# Patient Record
Sex: Male | Born: 1945 | Race: White | Hispanic: No | Smoking: Current every day smoker
Health system: Southern US, Community
[De-identification: ages and names within clinical notes are randomized; demographics above are authoritative.]

## PROBLEM LIST (undated history)

## (undated) DIAGNOSIS — I219 Acute myocardial infarction, unspecified: Secondary | ICD-10-CM

## (undated) DIAGNOSIS — M2420 Disorder of ligament, unspecified site: Secondary | ICD-10-CM

## (undated) DIAGNOSIS — F32A Depression, unspecified: Secondary | ICD-10-CM

## (undated) DIAGNOSIS — J449 Chronic obstructive pulmonary disease, unspecified: Secondary | ICD-10-CM

## (undated) DIAGNOSIS — G5600 Carpal tunnel syndrome, unspecified upper limb: Secondary | ICD-10-CM

## (undated) DIAGNOSIS — I10 Essential (primary) hypertension: Secondary | ICD-10-CM

## (undated) DIAGNOSIS — I259 Chronic ischemic heart disease, unspecified: Secondary | ICD-10-CM

## (undated) DIAGNOSIS — E785 Hyperlipidemia, unspecified: Secondary | ICD-10-CM

## (undated) DIAGNOSIS — L570 Actinic keratosis: Secondary | ICD-10-CM

## (undated) DIAGNOSIS — R0602 Shortness of breath: Secondary | ICD-10-CM

## (undated) DIAGNOSIS — M629 Disorder of muscle, unspecified: Secondary | ICD-10-CM

## (undated) DIAGNOSIS — F329 Major depressive disorder, single episode, unspecified: Secondary | ICD-10-CM

## (undated) DIAGNOSIS — I739 Peripheral vascular disease, unspecified: Secondary | ICD-10-CM

## (undated) DIAGNOSIS — I2510 Atherosclerotic heart disease of native coronary artery without angina pectoris: Secondary | ICD-10-CM

## (undated) DIAGNOSIS — M19019 Primary osteoarthritis, unspecified shoulder: Secondary | ICD-10-CM

## (undated) DIAGNOSIS — I4891 Unspecified atrial fibrillation: Secondary | ICD-10-CM

## (undated) DIAGNOSIS — M5030 Other cervical disc degeneration, unspecified cervical region: Secondary | ICD-10-CM

## (undated) HISTORY — DX: Disorder of muscle, unspecified: M62.9

## (undated) HISTORY — DX: Atherosclerotic heart disease of native coronary artery without angina pectoris: I25.10

## (undated) HISTORY — PX: CORONARY ARTERY BYPASS GRAFT: SHX141

## (undated) HISTORY — DX: Essential (primary) hypertension: I10

## (undated) HISTORY — PX: CORONARY ANGIOPLASTY: SHX604

## (undated) HISTORY — PX: FINGER AMPUTATION: SHX636

## (undated) HISTORY — DX: Actinic keratosis: L57.0

## (undated) HISTORY — DX: Hyperlipidemia, unspecified: E78.5

## (undated) HISTORY — DX: Peripheral vascular disease, unspecified: I73.9

## (undated) HISTORY — DX: Disorder of ligament, unspecified site: M24.20

## (undated) HISTORY — PX: CARPAL TUNNEL RELEASE: SHX101

## (undated) HISTORY — DX: Chronic ischemic heart disease, unspecified: I25.9

## (undated) HISTORY — DX: Other cervical disc degeneration, unspecified cervical region: M50.30

## (undated) HISTORY — DX: Chronic obstructive pulmonary disease, unspecified: J44.9

## (undated) HISTORY — DX: Unspecified atrial fibrillation: I48.91

## (undated) HISTORY — DX: Primary osteoarthritis, unspecified shoulder: M19.019

## (undated) HISTORY — DX: Carpal tunnel syndrome, unspecified upper limb: G56.00

## (undated) HISTORY — DX: Depression, unspecified: F32.A

## (undated) HISTORY — DX: Major depressive disorder, single episode, unspecified: F32.9

---

## 2002-10-10 ENCOUNTER — Encounter: Payer: Self-pay | Admitting: Emergency Medicine

## 2002-10-10 ENCOUNTER — Inpatient Hospital Stay (HOSPITAL_COMMUNITY): Admission: EM | Admit: 2002-10-10 | Discharge: 2002-10-21 | Payer: Self-pay | Admitting: Emergency Medicine

## 2002-10-11 ENCOUNTER — Encounter: Payer: Self-pay | Admitting: Internal Medicine

## 2002-10-12 ENCOUNTER — Encounter (INDEPENDENT_AMBULATORY_CARE_PROVIDER_SITE_OTHER): Payer: Self-pay | Admitting: Cardiology

## 2002-10-13 ENCOUNTER — Encounter: Payer: Self-pay | Admitting: Internal Medicine

## 2002-10-15 ENCOUNTER — Encounter: Payer: Self-pay | Admitting: Cardiothoracic Surgery

## 2002-10-16 ENCOUNTER — Encounter: Payer: Self-pay | Admitting: Cardiothoracic Surgery

## 2002-10-17 ENCOUNTER — Encounter: Payer: Self-pay | Admitting: Cardiothoracic Surgery

## 2002-10-18 ENCOUNTER — Encounter: Payer: Self-pay | Admitting: Surgery

## 2002-10-19 ENCOUNTER — Encounter: Payer: Self-pay | Admitting: Surgery

## 2002-10-20 ENCOUNTER — Encounter: Payer: Self-pay | Admitting: Cardiothoracic Surgery

## 2002-11-13 ENCOUNTER — Encounter: Admission: RE | Admit: 2002-11-13 | Discharge: 2002-11-13 | Payer: Self-pay | Admitting: Cardiothoracic Surgery

## 2002-11-13 ENCOUNTER — Encounter: Payer: Self-pay | Admitting: Cardiothoracic Surgery

## 2002-12-03 ENCOUNTER — Encounter: Payer: Self-pay | Admitting: Cardiology

## 2002-12-03 ENCOUNTER — Inpatient Hospital Stay (HOSPITAL_COMMUNITY): Admission: AD | Admit: 2002-12-03 | Discharge: 2002-12-07 | Payer: Self-pay | Admitting: Cardiology

## 2003-06-06 ENCOUNTER — Emergency Department (HOSPITAL_COMMUNITY): Admission: AD | Admit: 2003-06-06 | Discharge: 2003-06-06 | Payer: Self-pay | Admitting: Family Medicine

## 2003-09-29 ENCOUNTER — Inpatient Hospital Stay (HOSPITAL_COMMUNITY): Admission: EM | Admit: 2003-09-29 | Discharge: 2003-10-06 | Payer: Self-pay | Admitting: Emergency Medicine

## 2003-09-29 ENCOUNTER — Encounter (INDEPENDENT_AMBULATORY_CARE_PROVIDER_SITE_OTHER): Payer: Self-pay | Admitting: Cardiology

## 2003-10-04 DIAGNOSIS — Z9861 Coronary angioplasty status: Secondary | ICD-10-CM

## 2003-11-12 ENCOUNTER — Inpatient Hospital Stay (HOSPITAL_COMMUNITY): Admission: EM | Admit: 2003-11-12 | Discharge: 2003-11-13 | Payer: Self-pay | Admitting: Emergency Medicine

## 2004-07-09 ENCOUNTER — Encounter (INDEPENDENT_AMBULATORY_CARE_PROVIDER_SITE_OTHER): Payer: Self-pay | Admitting: Nurse Practitioner

## 2004-07-09 LAB — CONVERTED CEMR LAB: PSA: 0.32 ng/mL

## 2005-04-25 ENCOUNTER — Ambulatory Visit: Payer: Self-pay | Admitting: Family Medicine

## 2005-05-09 ENCOUNTER — Ambulatory Visit: Payer: Self-pay | Admitting: Family Medicine

## 2005-05-10 ENCOUNTER — Encounter (INDEPENDENT_AMBULATORY_CARE_PROVIDER_SITE_OTHER): Payer: Self-pay | Admitting: Nurse Practitioner

## 2005-06-26 ENCOUNTER — Ambulatory Visit: Payer: Self-pay | Admitting: Family Medicine

## 2005-08-08 ENCOUNTER — Ambulatory Visit: Payer: Self-pay | Admitting: Family Medicine

## 2005-11-09 ENCOUNTER — Emergency Department (HOSPITAL_COMMUNITY): Admission: EM | Admit: 2005-11-09 | Discharge: 2005-11-09 | Payer: Self-pay | Admitting: Family Medicine

## 2005-11-27 ENCOUNTER — Emergency Department (HOSPITAL_COMMUNITY): Admission: EM | Admit: 2005-11-27 | Discharge: 2005-11-27 | Payer: Self-pay | Admitting: Family Medicine

## 2005-11-29 ENCOUNTER — Ambulatory Visit: Payer: Self-pay | Admitting: Internal Medicine

## 2005-12-12 ENCOUNTER — Ambulatory Visit (HOSPITAL_COMMUNITY): Admission: RE | Admit: 2005-12-12 | Discharge: 2005-12-12 | Payer: Self-pay | Admitting: Orthopedic Surgery

## 2005-12-26 ENCOUNTER — Encounter: Admission: RE | Admit: 2005-12-26 | Discharge: 2005-12-26 | Payer: Self-pay | Admitting: Cardiology

## 2006-01-16 ENCOUNTER — Ambulatory Visit (HOSPITAL_BASED_OUTPATIENT_CLINIC_OR_DEPARTMENT_OTHER): Admission: RE | Admit: 2006-01-16 | Discharge: 2006-01-16 | Payer: Self-pay | Admitting: Orthopedic Surgery

## 2006-07-23 ENCOUNTER — Ambulatory Visit: Payer: Self-pay | Admitting: Family Medicine

## 2006-09-16 ENCOUNTER — Ambulatory Visit: Payer: Self-pay | Admitting: Family Medicine

## 2006-09-17 ENCOUNTER — Ambulatory Visit (HOSPITAL_COMMUNITY): Admission: RE | Admit: 2006-09-17 | Discharge: 2006-09-17 | Payer: Self-pay | Admitting: *Deleted

## 2006-10-23 ENCOUNTER — Inpatient Hospital Stay (HOSPITAL_COMMUNITY): Admission: EM | Admit: 2006-10-23 | Discharge: 2006-10-25 | Payer: Self-pay | Admitting: *Deleted

## 2006-11-21 ENCOUNTER — Encounter: Admission: RE | Admit: 2006-11-21 | Discharge: 2006-12-11 | Payer: Self-pay | Admitting: Orthopedic Surgery

## 2007-02-07 ENCOUNTER — Encounter (INDEPENDENT_AMBULATORY_CARE_PROVIDER_SITE_OTHER): Payer: Self-pay | Admitting: Nurse Practitioner

## 2007-02-17 ENCOUNTER — Ambulatory Visit: Payer: Self-pay | Admitting: Internal Medicine

## 2007-03-20 ENCOUNTER — Encounter (INDEPENDENT_AMBULATORY_CARE_PROVIDER_SITE_OTHER): Payer: Self-pay | Admitting: Nurse Practitioner

## 2007-03-24 ENCOUNTER — Encounter (INDEPENDENT_AMBULATORY_CARE_PROVIDER_SITE_OTHER): Payer: Self-pay | Admitting: Nurse Practitioner

## 2007-03-24 DIAGNOSIS — I739 Peripheral vascular disease, unspecified: Secondary | ICD-10-CM

## 2007-03-24 DIAGNOSIS — I2589 Other forms of chronic ischemic heart disease: Secondary | ICD-10-CM

## 2007-03-24 DIAGNOSIS — E785 Hyperlipidemia, unspecified: Secondary | ICD-10-CM

## 2007-03-24 DIAGNOSIS — F17200 Nicotine dependence, unspecified, uncomplicated: Secondary | ICD-10-CM

## 2007-03-24 DIAGNOSIS — I4891 Unspecified atrial fibrillation: Secondary | ICD-10-CM | POA: Insufficient documentation

## 2007-03-24 DIAGNOSIS — I2510 Atherosclerotic heart disease of native coronary artery without angina pectoris: Secondary | ICD-10-CM

## 2007-05-06 ENCOUNTER — Encounter (INDEPENDENT_AMBULATORY_CARE_PROVIDER_SITE_OTHER): Payer: Self-pay | Admitting: Nurse Practitioner

## 2007-06-03 ENCOUNTER — Encounter (INDEPENDENT_AMBULATORY_CARE_PROVIDER_SITE_OTHER): Payer: Self-pay | Admitting: Nurse Practitioner

## 2007-06-13 ENCOUNTER — Ambulatory Visit: Payer: Self-pay | Admitting: Nurse Practitioner

## 2007-06-13 DIAGNOSIS — J40 Bronchitis, not specified as acute or chronic: Secondary | ICD-10-CM | POA: Insufficient documentation

## 2007-06-13 LAB — CONVERTED CEMR LAB
ALT: 15 units/L (ref 0–53)
Basophils Absolute: 0 10*3/uL (ref 0.0–0.1)
Calcium: 9.8 mg/dL (ref 8.4–10.5)
Cholesterol: 166 mg/dL (ref 0–200)
Eosinophils Absolute: 0.2 10*3/uL (ref 0.2–0.7)
Eosinophils Relative: 3 % (ref 0–5)
HCT: 48.6 % (ref 39.0–52.0)
Hemoglobin: 17 g/dL (ref 13.0–17.0)
MCV: 91.9 fL (ref 78.0–100.0)
Monocytes Absolute: 0.6 10*3/uL (ref 0.1–1.0)
Monocytes Relative: 9 % (ref 3–12)
Neutrophils Relative %: 50 % (ref 43–77)
Potassium: 4.9 meq/L (ref 3.5–5.3)
RBC: 5.29 M/uL (ref 4.22–5.81)
RDW: 12.4 % (ref 11.5–15.5)
Sodium: 137 meq/L (ref 135–145)
TSH: 1.064 microintl units/mL (ref 0.350–5.50)
Total CHOL/HDL Ratio: 4.3
Total Protein: 7.7 g/dL (ref 6.0–8.3)
Triglycerides: 87 mg/dL (ref <150)
VLDL: 17 mg/dL (ref 0–40)

## 2007-06-18 ENCOUNTER — Encounter (INDEPENDENT_AMBULATORY_CARE_PROVIDER_SITE_OTHER): Payer: Self-pay | Admitting: Nurse Practitioner

## 2007-07-09 ENCOUNTER — Encounter (INDEPENDENT_AMBULATORY_CARE_PROVIDER_SITE_OTHER): Payer: Self-pay | Admitting: Nurse Practitioner

## 2007-08-01 ENCOUNTER — Encounter (INDEPENDENT_AMBULATORY_CARE_PROVIDER_SITE_OTHER): Payer: Self-pay | Admitting: Internal Medicine

## 2007-08-07 ENCOUNTER — Ambulatory Visit: Payer: Self-pay | Admitting: Internal Medicine

## 2007-08-07 DIAGNOSIS — J4489 Other specified chronic obstructive pulmonary disease: Secondary | ICD-10-CM | POA: Insufficient documentation

## 2007-08-07 DIAGNOSIS — G5600 Carpal tunnel syndrome, unspecified upper limb: Secondary | ICD-10-CM

## 2007-08-07 DIAGNOSIS — Z9889 Other specified postprocedural states: Secondary | ICD-10-CM

## 2007-08-07 DIAGNOSIS — J449 Chronic obstructive pulmonary disease, unspecified: Secondary | ICD-10-CM

## 2007-08-07 LAB — CONVERTED CEMR LAB: Blood Glucose, Fingerstick: 107

## 2007-08-08 ENCOUNTER — Ambulatory Visit: Payer: Self-pay | Admitting: Internal Medicine

## 2007-08-09 ENCOUNTER — Encounter (INDEPENDENT_AMBULATORY_CARE_PROVIDER_SITE_OTHER): Payer: Self-pay | Admitting: Internal Medicine

## 2007-08-09 LAB — CONVERTED CEMR LAB
Albumin: 4.1 g/dL (ref 3.5–5.2)
Alkaline Phosphatase: 54 units/L (ref 39–117)
BUN: 14 mg/dL (ref 6–23)
Basophils Relative: 1 % (ref 0–1)
CO2: 27 meq/L (ref 19–32)
Calcium: 9.5 mg/dL (ref 8.4–10.5)
Cholesterol: 127 mg/dL (ref 0–200)
Creatinine, Ser: 1.06 mg/dL (ref 0.40–1.50)
Eosinophils Absolute: 0.3 10*3/uL (ref 0.0–0.7)
Hemoglobin: 16.5 g/dL (ref 13.0–17.0)
LDL Cholesterol: 80 mg/dL (ref 0–99)
Lymphocytes Relative: 42 % (ref 12–46)
Lymphs Abs: 2.6 10*3/uL (ref 0.7–4.0)
MCV: 95.6 fL (ref 78.0–100.0)
Monocytes Relative: 9 % (ref 3–12)
Neutrophils Relative %: 43 % (ref 43–77)
Platelets: 151 10*3/uL (ref 150–400)
Sodium: 142 meq/L (ref 135–145)
Total CHOL/HDL Ratio: 3.5
Total Protein: 6.7 g/dL (ref 6.0–8.3)
WBC: 6.2 10*3/uL (ref 4.0–10.5)

## 2007-08-19 ENCOUNTER — Ambulatory Visit: Payer: Self-pay | Admitting: Surgery

## 2007-08-19 ENCOUNTER — Encounter (INDEPENDENT_AMBULATORY_CARE_PROVIDER_SITE_OTHER): Payer: Self-pay | Admitting: Internal Medicine

## 2007-08-19 ENCOUNTER — Ambulatory Visit (HOSPITAL_COMMUNITY): Admission: RE | Admit: 2007-08-19 | Discharge: 2007-08-19 | Payer: Self-pay | Admitting: Internal Medicine

## 2007-08-26 ENCOUNTER — Encounter (INDEPENDENT_AMBULATORY_CARE_PROVIDER_SITE_OTHER): Payer: Self-pay | Admitting: Internal Medicine

## 2007-09-12 ENCOUNTER — Encounter (INDEPENDENT_AMBULATORY_CARE_PROVIDER_SITE_OTHER): Payer: Self-pay | Admitting: Internal Medicine

## 2007-09-27 ENCOUNTER — Encounter (INDEPENDENT_AMBULATORY_CARE_PROVIDER_SITE_OTHER): Payer: Self-pay | Admitting: Internal Medicine

## 2007-10-03 ENCOUNTER — Ambulatory Visit: Payer: Self-pay | Admitting: Internal Medicine

## 2007-10-10 ENCOUNTER — Encounter (INDEPENDENT_AMBULATORY_CARE_PROVIDER_SITE_OTHER): Payer: Self-pay | Admitting: Internal Medicine

## 2007-10-24 ENCOUNTER — Encounter (INDEPENDENT_AMBULATORY_CARE_PROVIDER_SITE_OTHER): Payer: Self-pay | Admitting: Internal Medicine

## 2007-11-14 ENCOUNTER — Ambulatory Visit: Payer: Self-pay | Admitting: Internal Medicine

## 2007-11-14 DIAGNOSIS — E1165 Type 2 diabetes mellitus with hyperglycemia: Secondary | ICD-10-CM

## 2007-11-14 DIAGNOSIS — I10 Essential (primary) hypertension: Secondary | ICD-10-CM

## 2007-11-14 LAB — CONVERTED CEMR LAB
Blood Glucose, Fingerstick: 126
HDL: 39 mg/dL — ABNORMAL LOW (ref >39)
Hgb A1c MFr Bld: 7.5 %
LDL Cholesterol: 90 mg/dL (ref 0–99)
VLDL: 16 mg/dL (ref 0–40)

## 2007-11-21 ENCOUNTER — Encounter (INDEPENDENT_AMBULATORY_CARE_PROVIDER_SITE_OTHER): Payer: Self-pay | Admitting: Internal Medicine

## 2007-11-23 ENCOUNTER — Encounter (INDEPENDENT_AMBULATORY_CARE_PROVIDER_SITE_OTHER): Payer: Self-pay | Admitting: Internal Medicine

## 2008-02-24 ENCOUNTER — Emergency Department (HOSPITAL_COMMUNITY): Admission: EM | Admit: 2008-02-24 | Discharge: 2008-02-24 | Payer: Self-pay | Admitting: Emergency Medicine

## 2008-03-24 ENCOUNTER — Encounter (INDEPENDENT_AMBULATORY_CARE_PROVIDER_SITE_OTHER): Payer: Self-pay | Admitting: *Deleted

## 2008-05-07 ENCOUNTER — Encounter (INDEPENDENT_AMBULATORY_CARE_PROVIDER_SITE_OTHER): Payer: Self-pay | Admitting: *Deleted

## 2008-05-14 ENCOUNTER — Ambulatory Visit: Payer: Self-pay | Admitting: Internal Medicine

## 2008-05-22 ENCOUNTER — Encounter (INDEPENDENT_AMBULATORY_CARE_PROVIDER_SITE_OTHER): Payer: Self-pay | Admitting: Internal Medicine

## 2008-05-22 LAB — CONVERTED CEMR LAB
Alkaline Phosphatase: 45 units/L (ref 39–117)
BUN: 13 mg/dL (ref 6–23)
CO2: 25 meq/L (ref 19–32)
Cholesterol: 136 mg/dL (ref 0–200)
Creatinine, Ser: 1.1 mg/dL (ref 0.40–1.50)
Eosinophils Absolute: 0.2 10*3/uL (ref 0.0–0.7)
Eosinophils Relative: 3 % (ref 0–5)
Glucose, Bld: 59 mg/dL — ABNORMAL LOW (ref 70–99)
HCT: 49 % (ref 39.0–52.0)
HDL: 46 mg/dL (ref >39)
Hemoglobin: 16.3 g/dL (ref 13.0–17.0)
LDL Cholesterol: 76 mg/dL (ref 0–99)
Lymphocytes Relative: 43 % (ref 12–46)
Lymphs Abs: 3.4 10*3/uL (ref 0.7–4.0)
MCV: 95.9 fL (ref 78.0–100.0)
Monocytes Absolute: 0.5 10*3/uL (ref 0.1–1.0)
Monocytes Relative: 6 % (ref 3–12)
Platelets: 170 10*3/uL (ref 150–400)
Sodium: 142 meq/L (ref 135–145)
Total Bilirubin: 0.7 mg/dL (ref 0.3–1.2)
Total CHOL/HDL Ratio: 3
Total Protein: 7.6 g/dL (ref 6.0–8.3)
Triglycerides: 68 mg/dL (ref <150)
VLDL: 14 mg/dL (ref 0–40)
WBC: 7.9 10*3/uL (ref 4.0–10.5)

## 2008-05-25 ENCOUNTER — Encounter (INDEPENDENT_AMBULATORY_CARE_PROVIDER_SITE_OTHER): Payer: Self-pay | Admitting: *Deleted

## 2008-06-21 ENCOUNTER — Telehealth (INDEPENDENT_AMBULATORY_CARE_PROVIDER_SITE_OTHER): Payer: Self-pay | Admitting: Internal Medicine

## 2008-06-25 ENCOUNTER — Telehealth (INDEPENDENT_AMBULATORY_CARE_PROVIDER_SITE_OTHER): Payer: Self-pay | Admitting: Internal Medicine

## 2008-07-26 ENCOUNTER — Encounter (INDEPENDENT_AMBULATORY_CARE_PROVIDER_SITE_OTHER): Payer: Self-pay | Admitting: Internal Medicine

## 2008-08-12 ENCOUNTER — Encounter (INDEPENDENT_AMBULATORY_CARE_PROVIDER_SITE_OTHER): Payer: Self-pay | Admitting: Internal Medicine

## 2008-08-31 ENCOUNTER — Emergency Department (HOSPITAL_COMMUNITY): Admission: EM | Admit: 2008-08-31 | Discharge: 2008-08-31 | Payer: Self-pay | Admitting: Emergency Medicine

## 2008-09-10 ENCOUNTER — Telehealth (INDEPENDENT_AMBULATORY_CARE_PROVIDER_SITE_OTHER): Payer: Self-pay | Admitting: Internal Medicine

## 2008-09-15 ENCOUNTER — Ambulatory Visit: Payer: Self-pay | Admitting: Internal Medicine

## 2008-09-15 LAB — CONVERTED CEMR LAB
LDL Cholesterol: 85 mg/dL (ref 0–99)
Triglycerides: 47 mg/dL (ref <150)
VLDL: 9 mg/dL (ref 0–40)

## 2008-09-17 ENCOUNTER — Ambulatory Visit: Payer: Self-pay | Admitting: Internal Medicine

## 2008-09-17 DIAGNOSIS — M19019 Primary osteoarthritis, unspecified shoulder: Secondary | ICD-10-CM | POA: Insufficient documentation

## 2008-09-17 DIAGNOSIS — M5030 Other cervical disc degeneration, unspecified cervical region: Secondary | ICD-10-CM

## 2008-09-17 LAB — CONVERTED CEMR LAB: Blood Glucose, Fingerstick: 78

## 2008-10-15 ENCOUNTER — Emergency Department (HOSPITAL_COMMUNITY): Admission: EM | Admit: 2008-10-15 | Discharge: 2008-10-15 | Payer: Self-pay | Admitting: Emergency Medicine

## 2008-10-18 ENCOUNTER — Encounter (INDEPENDENT_AMBULATORY_CARE_PROVIDER_SITE_OTHER): Payer: Self-pay | Admitting: Internal Medicine

## 2008-11-02 ENCOUNTER — Encounter (INDEPENDENT_AMBULATORY_CARE_PROVIDER_SITE_OTHER): Payer: Self-pay | Admitting: Internal Medicine

## 2008-11-03 ENCOUNTER — Telehealth (INDEPENDENT_AMBULATORY_CARE_PROVIDER_SITE_OTHER): Payer: Self-pay | Admitting: Internal Medicine

## 2008-12-22 ENCOUNTER — Ambulatory Visit: Payer: Self-pay | Admitting: Internal Medicine

## 2008-12-22 LAB — CONVERTED CEMR LAB
AST: 17 units/L (ref 0–37)
Alkaline Phosphatase: 64 units/L (ref 39–117)
HDL: 38 mg/dL — ABNORMAL LOW (ref >39)
Indirect Bilirubin: 0.5 mg/dL (ref 0.0–0.9)
LDL Cholesterol: 114 mg/dL — ABNORMAL HIGH (ref 0–99)
Total Protein: 7 g/dL (ref 6.0–8.3)
Triglycerides: 90 mg/dL (ref <150)

## 2008-12-24 ENCOUNTER — Ambulatory Visit: Payer: Self-pay | Admitting: Internal Medicine

## 2009-02-04 ENCOUNTER — Ambulatory Visit: Payer: Self-pay | Admitting: Internal Medicine

## 2009-02-04 DIAGNOSIS — D485 Neoplasm of uncertain behavior of skin: Secondary | ICD-10-CM

## 2009-02-04 DIAGNOSIS — F329 Major depressive disorder, single episode, unspecified: Secondary | ICD-10-CM

## 2009-02-04 LAB — CONVERTED CEMR LAB: Hgb A1c MFr Bld: 6.5 %

## 2009-03-18 ENCOUNTER — Ambulatory Visit: Payer: Self-pay | Admitting: Internal Medicine

## 2009-03-18 LAB — CONVERTED CEMR LAB: Blood Glucose, Fingerstick: 139

## 2009-04-01 ENCOUNTER — Ambulatory Visit: Payer: Self-pay | Admitting: Internal Medicine

## 2009-04-05 ENCOUNTER — Encounter (INDEPENDENT_AMBULATORY_CARE_PROVIDER_SITE_OTHER): Payer: Self-pay | Admitting: Internal Medicine

## 2009-04-13 ENCOUNTER — Emergency Department (HOSPITAL_COMMUNITY): Admission: EM | Admit: 2009-04-13 | Discharge: 2009-04-13 | Payer: Self-pay | Admitting: Emergency Medicine

## 2009-04-14 ENCOUNTER — Encounter (INDEPENDENT_AMBULATORY_CARE_PROVIDER_SITE_OTHER): Payer: Self-pay | Admitting: *Deleted

## 2009-04-14 LAB — CONVERTED CEMR LAB
ALT: 15 units/L (ref 0–53)
Albumin: 4.2 g/dL (ref 3.5–5.2)
Alkaline Phosphatase: 79 units/L (ref 39–117)
Cholesterol: 173 mg/dL (ref 0–200)
HDL: 34 mg/dL — ABNORMAL LOW (ref >39)
Indirect Bilirubin: 0.6 mg/dL (ref 0.0–0.9)
Total Protein: 6.4 g/dL (ref 6.0–8.3)
Triglycerides: 128 mg/dL (ref <150)
VLDL: 26 mg/dL (ref 0–40)

## 2009-05-12 ENCOUNTER — Encounter (INDEPENDENT_AMBULATORY_CARE_PROVIDER_SITE_OTHER): Payer: Self-pay | Admitting: Internal Medicine

## 2009-05-12 DIAGNOSIS — L570 Actinic keratosis: Secondary | ICD-10-CM

## 2009-05-19 ENCOUNTER — Telehealth (INDEPENDENT_AMBULATORY_CARE_PROVIDER_SITE_OTHER): Payer: Self-pay | Admitting: Internal Medicine

## 2009-05-31 ENCOUNTER — Ambulatory Visit: Payer: Self-pay | Admitting: Internal Medicine

## 2009-06-17 ENCOUNTER — Emergency Department (HOSPITAL_COMMUNITY): Admission: EM | Admit: 2009-06-17 | Discharge: 2009-06-17 | Payer: Self-pay | Admitting: Family Medicine

## 2009-06-27 LAB — CONVERTED CEMR LAB
Alkaline Phosphatase: 91 units/L (ref 39–117)
BUN: 15 mg/dL (ref 6–23)
Glucose, Bld: 117 mg/dL — ABNORMAL HIGH (ref 70–99)
HDL: 36 mg/dL — ABNORMAL LOW (ref >39)
LDL Cholesterol: 122 mg/dL — ABNORMAL HIGH (ref 0–99)
Total Bilirubin: 0.5 mg/dL (ref 0.3–1.2)
Total CHOL/HDL Ratio: 5.9
Triglycerides: 282 mg/dL — ABNORMAL HIGH (ref <150)
VLDL: 56 mg/dL — ABNORMAL HIGH (ref 0–40)

## 2009-08-01 ENCOUNTER — Encounter (INDEPENDENT_AMBULATORY_CARE_PROVIDER_SITE_OTHER): Payer: Self-pay | Admitting: Internal Medicine

## 2009-09-29 ENCOUNTER — Ambulatory Visit: Payer: Self-pay | Admitting: Internal Medicine

## 2009-10-15 ENCOUNTER — Observation Stay (HOSPITAL_COMMUNITY): Admission: EM | Admit: 2009-10-15 | Discharge: 2009-10-15 | Payer: Self-pay | Admitting: Emergency Medicine

## 2009-11-01 ENCOUNTER — Encounter: Admission: RE | Admit: 2009-11-01 | Discharge: 2010-01-30 | Payer: Self-pay | Admitting: Plastic Surgery

## 2009-11-04 ENCOUNTER — Emergency Department (HOSPITAL_COMMUNITY): Admission: EM | Admit: 2009-11-04 | Discharge: 2009-11-04 | Payer: Self-pay | Admitting: Family Medicine

## 2009-11-18 ENCOUNTER — Telehealth (INDEPENDENT_AMBULATORY_CARE_PROVIDER_SITE_OTHER): Payer: Self-pay | Admitting: Internal Medicine

## 2009-11-23 ENCOUNTER — Telehealth (INDEPENDENT_AMBULATORY_CARE_PROVIDER_SITE_OTHER): Payer: Self-pay | Admitting: Internal Medicine

## 2009-11-30 ENCOUNTER — Ambulatory Visit: Payer: Self-pay | Admitting: Internal Medicine

## 2009-12-06 ENCOUNTER — Encounter (INDEPENDENT_AMBULATORY_CARE_PROVIDER_SITE_OTHER): Payer: Self-pay | Admitting: Internal Medicine

## 2010-01-04 LAB — CONVERTED CEMR LAB
ALT: 12 units/L (ref 0–53)
AST: 17 units/L (ref 0–37)
Albumin: 4.1 g/dL (ref 3.5–5.2)
Alkaline Phosphatase: 94 units/L (ref 39–117)
LDL Cholesterol: 171 mg/dL — ABNORMAL HIGH (ref 0–99)
Potassium: 4.4 meq/L (ref 3.5–5.3)
Sodium: 141 meq/L (ref 135–145)
Total Protein: 6.9 g/dL (ref 6.0–8.3)

## 2010-02-08 ENCOUNTER — Encounter (INDEPENDENT_AMBULATORY_CARE_PROVIDER_SITE_OTHER): Payer: Self-pay | Admitting: Internal Medicine

## 2010-02-19 ENCOUNTER — Emergency Department (HOSPITAL_COMMUNITY): Admission: EM | Admit: 2010-02-19 | Discharge: 2010-02-19 | Payer: Self-pay | Admitting: Emergency Medicine

## 2010-02-24 ENCOUNTER — Encounter (INDEPENDENT_AMBULATORY_CARE_PROVIDER_SITE_OTHER): Payer: Self-pay | Admitting: Internal Medicine

## 2010-02-27 ENCOUNTER — Ambulatory Visit: Payer: Self-pay | Admitting: Internal Medicine

## 2010-02-27 ENCOUNTER — Telehealth (INDEPENDENT_AMBULATORY_CARE_PROVIDER_SITE_OTHER): Payer: Self-pay | Admitting: Internal Medicine

## 2010-02-27 LAB — CONVERTED CEMR LAB
ALT: 27 units/L (ref 0–53)
AST: 32 units/L (ref 0–37)
Bilirubin, Direct: 0.3 mg/dL (ref 0.0–0.3)
Cholesterol: 170 mg/dL (ref 0–200)
HDL: 33 mg/dL — ABNORMAL LOW (ref >39)
Total CHOL/HDL Ratio: 5.2

## 2010-03-01 ENCOUNTER — Encounter (INDEPENDENT_AMBULATORY_CARE_PROVIDER_SITE_OTHER): Payer: Self-pay | Admitting: Internal Medicine

## 2010-03-17 ENCOUNTER — Ambulatory Visit: Payer: Self-pay | Admitting: Internal Medicine

## 2010-03-17 LAB — CONVERTED CEMR LAB: Blood Glucose, Fingerstick: 107

## 2010-03-27 ENCOUNTER — Encounter (INDEPENDENT_AMBULATORY_CARE_PROVIDER_SITE_OTHER): Payer: Self-pay | Admitting: *Deleted

## 2010-04-06 ENCOUNTER — Ambulatory Visit: Payer: Self-pay | Admitting: Physician Assistant

## 2010-04-06 DIAGNOSIS — R51 Headache: Secondary | ICD-10-CM

## 2010-04-06 DIAGNOSIS — R519 Headache, unspecified: Secondary | ICD-10-CM | POA: Insufficient documentation

## 2010-04-07 ENCOUNTER — Ambulatory Visit (HOSPITAL_COMMUNITY)
Admission: RE | Admit: 2010-04-07 | Discharge: 2010-04-07 | Payer: Self-pay | Source: Home / Self Care | Admitting: Internal Medicine

## 2010-04-07 ENCOUNTER — Encounter (INDEPENDENT_AMBULATORY_CARE_PROVIDER_SITE_OTHER): Payer: Self-pay | Admitting: Internal Medicine

## 2010-04-12 ENCOUNTER — Encounter (INDEPENDENT_AMBULATORY_CARE_PROVIDER_SITE_OTHER): Payer: Self-pay | Admitting: *Deleted

## 2010-04-17 ENCOUNTER — Ambulatory Visit: Payer: Self-pay | Admitting: Internal Medicine

## 2010-04-26 LAB — CONVERTED CEMR LAB: INR: 1.27 (ref <1.50)

## 2010-05-03 ENCOUNTER — Encounter (INDEPENDENT_AMBULATORY_CARE_PROVIDER_SITE_OTHER): Payer: Self-pay | Admitting: Internal Medicine

## 2010-05-18 ENCOUNTER — Ambulatory Visit: Payer: Self-pay | Admitting: Internal Medicine

## 2010-06-05 ENCOUNTER — Telehealth (INDEPENDENT_AMBULATORY_CARE_PROVIDER_SITE_OTHER): Payer: Self-pay | Admitting: Internal Medicine

## 2010-06-16 ENCOUNTER — Encounter (INDEPENDENT_AMBULATORY_CARE_PROVIDER_SITE_OTHER): Payer: Self-pay | Admitting: Internal Medicine

## 2010-06-16 ENCOUNTER — Ambulatory Visit: Payer: Self-pay | Admitting: Internal Medicine

## 2010-06-23 LAB — CONVERTED CEMR LAB
INR: 1.32 (ref <1.50)
Prothrombin Time: 16.6 s — ABNORMAL HIGH (ref 11.6–15.2)

## 2010-07-11 ENCOUNTER — Ambulatory Visit
Admission: RE | Admit: 2010-07-11 | Discharge: 2010-07-11 | Payer: Self-pay | Source: Home / Self Care | Attending: Internal Medicine | Admitting: Internal Medicine

## 2010-07-11 DIAGNOSIS — L821 Other seborrheic keratosis: Secondary | ICD-10-CM | POA: Insufficient documentation

## 2010-07-25 ENCOUNTER — Ambulatory Visit
Admission: RE | Admit: 2010-07-25 | Discharge: 2010-07-25 | Payer: Self-pay | Source: Home / Self Care | Attending: Internal Medicine | Admitting: Internal Medicine

## 2010-07-25 ENCOUNTER — Encounter (INDEPENDENT_AMBULATORY_CARE_PROVIDER_SITE_OTHER): Payer: Self-pay | Admitting: Internal Medicine

## 2010-07-30 ENCOUNTER — Encounter: Payer: Self-pay | Admitting: Cardiology

## 2010-08-04 LAB — CONVERTED CEMR LAB: Prothrombin Time: 24.8 s — ABNORMAL HIGH (ref 11.6–15.2)

## 2010-08-08 NOTE — Progress Notes (Signed)
Summary: Query - refill gabapentin?  Phone Note Outgoing Call   Summary of Call: Do you want his gabapentin refilled?  Has not been in for office visit since March.  Came in for labs this AM. Initial call taken by: Dutch Quint RN,  February 27, 2010 2:30 PM  Follow-up for Phone Call        ok to refill  Follow-up by: Lehman Prom FNP,  February 28, 2010 8:09 AM  Additional Follow-up for Phone Call Additional follow up Details #1::        Noted.  Dutch Quint RN  February 28, 2010 9:38 AM

## 2010-08-08 NOTE — Letter (Signed)
Summary: MEDICAL EQUIPMENT.Marland KitchenDIABETIC SHOES  MEDICAL EQUIPMENT.Marland KitchenDIABETIC SHOES   Imported By: Arta Bruce 12/06/2009 11:23:59  _____________________________________________________________________  External Attachment:    Type:   Image     Comment:   External Document

## 2010-08-08 NOTE — Progress Notes (Signed)
Summary: Query Lovaza  Phone Note From Pharmacy   Caller: [name] [address] [phone]* Summary of Call: Refill request Lovaza; Last filled with no refills, Last seen    Prescriptions: LOVAZA 1 GM CAPS (OMEGA-3-ACID ETHYL ESTERS) 4 caps by mouth daily  #120 x 11   Entered and Authorized by:   Julieanne Manson MD   Signed by:   Julieanne Manson MD on 06/06/2010   Method used:   Electronically to        [name] [id number]* (retail)       [address] [place], [place]  [address]       Ph: [phone]       Fax: [phone]   RxID:   [id number]

## 2010-08-08 NOTE — Letter (Signed)
Summary: [facility]MAILED  [facility]//MAILED   Imported By: Arta Bruce 04/07/2010 11:20:25  _____________________________________________________________________  External Attachment:    Type:   Image     Comment:   External Document

## 2010-08-08 NOTE — Letter (Signed)
Summary: *HSN Results Follow up  [facility]  [address] [place], [place] [address]   Phone: [phone]  Fax: [phone]      03/27/2010   Wayne [name] [address] [place], [place]  [address]   Dear  Wayne [name],                            ____S.Drinkard,FNP   ____D. Gore,FNP       ____B. McPherson,MD   ____V. Rankins,MD    ____E. Mulberry,MD    ____N. Daphine Deutscher, FNP  ____D. Reche Dixon, MD    ____K. Philipp Deputy, MD    ____Other     This letter is to inform you that your recent test(s):  _______Pap Smear    ____X___Lab Test     _______X-ray    __X_____ is within acceptable limits  _______ requires a medication change  ___X____ requires a follow-up lab visit  _______ requires a follow-up visit with your provider   Comments: We have been trying to reach you to let you know to stay on the same dose of coumadin and  follow up as planned in one month.       _________________________________________________________ If you have any questions, please contact our office                     Sincerely,  Armenia Shannon [facility]

## 2010-08-08 NOTE — Progress Notes (Signed)
Summary: Req for a new prescription (Dm shoes)  Phone Note Call from Patient Call back at [employer] Phone [phone] Call back at [phone]   Summary of Call: The pt at this moment is at the lobby and he needs a new prescription for his dm shoes because he misplaced the other one.  Also, a month ago at the ER the provider at there cut his index finger from his left hand because he had dm illness.  [name] [address])  Delrae Alfred MD  Initial call taken by: Manon Hilding,  Nov 18, 2009 12:18 PM    Prescriptions: DIABETIC SHOES peripheral neuropathy  #1 pair x 0   Entered by:   Vesta Mixer CMA   Authorized by:   Julieanne Manson MD   Signed by:   Vesta Mixer CMA on 11/18/2009   Method used:   Print then Give to Patient   RxID:   [id number]

## 2010-08-08 NOTE — Letter (Signed)
Summary: MEDICAL [facility]  MEDICAL EQUIPMENT/DIABETIC SHOES   Imported By: Arta Bruce 12/06/2009 11:05:59  _____________________________________________________________________  External Attachment:    Type:   Image     Comment:   External Document

## 2010-08-08 NOTE — Letter (Signed)
Summary: *HSN Results Follow up  [facility]  [address] [place], [place] [address]   Phone: [phone]  Fax: [phone]      04/12/2010   Wayne [name] [address] [place], [place]  [address]   Dear  Mr. Wayne [name],                            ____S.Drinkard,FNP   ____D. Gore,FNP       ____B. McPherson,MD   ____V. Rankins,MD    ____E. Mulberry,MD    ____N. Daphine Deutscher, FNP  ____D. Reche Dixon, MD    ____K. Philipp Deputy, MD    ____Other     This letter is to inform you that your recent test(s):  _______Pap Smear    _______Lab Test     _______X-ray   ___X___CT-Scan    _______ is within acceptable limits  _______ requires a medication change  _______ requires a follow-up lab visit  ___X____ requires a follow-up visit with your provider   Comments:  We have been trying to reach you.  Please give [place] a call at your earliest convenience.       _________________________________________________________ If you have any questions, please contact our office                     Sincerely,  Armenia Shannon [facility]

## 2010-08-08 NOTE — Assessment & Plan Note (Signed)
Summary: 4 MONTH FU///KT   Vital Signs:  Patient profile:   65 year old male Weight:      198.31 pounds BMI:     31.17 Temp:     97.3 degrees F Pulse rate:   102 / minute Pulse rhythm:   regular Resp:     20 per minute BP sitting:   116 / 77  (right arm) Cuff size:   regular  Vitals Entered By: Chauncy Passy, SMA CC: Pt. is here for a 4 month f/u from 11/10. DM Is Patient Diabetic? Yes Did you bring your meter with you today? No Pain Assessment Patient in pain? no      CBG Result 90  Does patient need assistance? Functional Status Self care Ambulation Normal   CC:  Pt. is here for a 4 month f/u from 11/10. DM.  History of Present Illness: 1.  Hyperlipidemia:  Not taking Lovaza--still has not been filled.  Do not see that preauthorization papers were done.  Pt. continues to have no knowledge in medication--cannot get him to bring his med bottles in on a consistent basis.  Lipids were not at goal at last check.  Best control was with Caduet and Gemfibrozil, though still a bit above goal.  2.  DM:  not checking sugars--states he doesn't have the time.  No eye check this year.  Goes to Dr. Mitzi Davenport.  No numbness or tingling in feet.    3.  Anticoagulation:  followed at [facility] coumadin clinic  4.  CAD:  no chest pain--feels like he is doing well.   No interest in discontinuing smoking.  [place]. [place]  Now with " Dr. Salena Saner"  at [facility].  5.  Iliotibial band syndrome:  doing okay.  Current Medications (verified): 1)  Caduet 5-40 Mg  Tabs (Amlodipine-Atorvastatin) .[name].. 1 Tab By Mouth Daily 2)  Gabapentin 300 Mg  Tabs (Gabapentin) .[name].. 1 Tab By Mouth Two Times A Day 3)  Coumadin 2.5 Mg  Tabs (Warfarin Sodium) .... Per Dr. Jacinto Halim 4)  Glimepiride 2 Mg  Tabs (Glimepiride) .[name].. 1 Tab Po Two Times A Day 5)  Klor-Con M20 20 Meq  Tbcr (Potassium Chloride Crys Cr) .[name].. 1 Tab By Mouth Daily 6)  Coumadin 5 Mg  Solr (Warfarin Sodium)  .... [facility] 7)  Furosemide 20 Mg Tabs (Furosemide) .[name].. 1 Tab By Mouth Daily 8)  Ramipril 10 Mg  Caps (Ramipril) .[name].. 1 Tab By Mouth Two Times A Day 9)  Spiriva Handihaler 18 Mcg  Caps (Tiotropium Bromide Monohydrate) .[name].. 1 Inhalation Daily 10)  Humibid Maximum Strength 1200 Mg  Tb12 (Guaifenesin) .[name].. 1 Tab By Mouth Two Times A Day 11)  Actos 15 Mg  Tabs (Pioglitazone Hcl) .[name].. 1 Tab By Mouth Daily 12)  Flovent Hfa 110 Mcg/act Aero (Fluticasone Propionate  Hfa) .... 2 Puffs Two Times A Day --Brush Teeth and Tongue After Use. 13)  Lovaza 1 Gm Caps (Omega-3-Acid Ethyl Esters) .... 4 Caps By Mouth Daily 14)  Cymbalta 30 Mg Cpep (Duloxetine Hcl) .... 2 Caps By Mouth Daily 15)  Viagra 50 Mg Tabs (Sildenafil Citrate) .[name].. 1-2 Tabs By Mouth 1/2 Hur To 4 Hours Before Activity As Needed.  Limit One Dose Daily 16)  Diabetic Shoes .... Peripheral Neuropathy  Allergies (verified): 1)  ! Codeine  Physical Exam  General:  NAD Lungs:  Clear though decreased BS throughout Heart:  Irreg.   Impression & Recommendations:  Problem # 1:  DYSLIPIDEMIA (ICD-272.4) Cancel Gemfibrozil--Medicaid called and okayed the Lovaza. The following medications were removed from the medication list:    Gemfibrozil 600 Mg Tabs (Gemfibrozil) .[name]... 1 tab by mouth two times a day His updated medication list for this problem includes:    Caduet 5-40 Mg Tabs (Amlodipine-atorvastatin) .[name]... 1 tab by mouth daily    Lovaza 1 Gm Caps (Omega-3-acid ethyl esters) .Marland KitchenMarland KitchenMarland KitchenMarland Kitchen 4 caps by mouth daily  Problem # 2:  DIABETES MELLITUS, TYPE II, UNCONTROLLED (ICD-250.02) Has been controlled despite pt. not following closely. His updated medication list for this problem includes:    Glimepiride 2 Mg Tabs (Glimepiride) .[name]... 1 tab po two times a day    Ramipril 10 Mg Caps (Ramipril) .[name]... 1 tab by mouth two times a day    Actos 15 Mg Tabs (Pioglitazone hcl) .[name]... 1 tab by mouth daily  Orders: T-Urine Microalbumin w/creat.  ratio [phone])  Problem # 3:  ESSENTIAL HYPERTENSION (ICD-401.9) Controlled His updated medication list for this problem includes:    Caduet 5-40 Mg Tabs (Amlodipine-atorvastatin) .[name]... 1 tab by mouth daily    Furosemide 20 Mg Tabs (Furosemide) .[name]... 1 tab by mouth daily    Ramipril 10 Mg Caps (Ramipril) .[name]... 1 tab by mouth two times a day  Complete Medication List: 1)  Caduet 5-40 Mg Tabs (Amlodipine-atorvastatin) .[name].. 1 tab by mouth daily 2)  Gabapentin 300 Mg Tabs (Gabapentin) .[name].. 1 tab by mouth two times a day 3)  Coumadin 2.5 Mg Tabs (Warfarin sodium) .... Per dr. Jacinto Halim 4)  Glimepiride 2 Mg Tabs (Glimepiride) .[name].. 1 tab po two times a day 5)  Klor-con M20 20 Meq Tbcr (Potassium chloride crys cr) .[name].. 1 tab by mouth daily 6)  Coumadin 5 Mg Solr (Warfarin sodium) .... [facility] 7)  Furosemide 20 Mg Tabs (Furosemide) .[name].. 1 tab by mouth daily 8)  Ramipril 10 Mg Caps (Ramipril) .[name].. 1 tab by mouth two times a day 9)  Spiriva Handihaler 18 Mcg Caps (Tiotropium bromide monohydrate) .[name].. 1 inhalation daily 10)  Humibid Maximum Strength 1200 Mg Tb12 (Guaifenesin) .[name].. 1 tab by mouth two times a day 11)  Actos 15 Mg Tabs (Pioglitazone hcl) .[name].. 1 tab by mouth daily 12)  Flovent Hfa 110 Mcg/act Aero (Fluticasone propionate  hfa) .... 2 puffs two times a day --brush teeth and tongue after use. 13)  Lovaza 1 Gm Caps (Omega-3-acid ethyl esters) .... 4 caps by mouth daily 14)  Cymbalta 30 Mg Cpep (Duloxetine hcl) .... 2 caps by mouth daily 15)  Viagra 50 Mg Tabs (Sildenafil citrate) .[name].. 1-2 tabs by mouth 1/2 hur to 4 hours before activity as needed.  limit one dose daily 16)  Diabetic Shoes  .... Peripheral neuropathy  Other Orders: Capillary Blood Glucose/CBG ([id number])  Patient Instructions: 1)  FLP, CMET in 2 months--fasting lab visit. 2)  Please make sure taking Gemfibrozil for 2 months before drawing blood. 3)  CPE with Dr. Delrae Alfred in 6  months Prescriptions: GEMFIBROZIL 600 MG TABS (GEMFIBROZIL) 1 tab by mouth two times a day  #60 x 6   Entered and Authorized by:   Julieanne Manson MD   Signed by:   Julieanne Manson MD on 09/29/2009   Method used:   Electronically to        [name] [id number] (retail)       [address] [place]       [place], [place]  [address]       Ph: [phone]       Fax: [phone]   RxID:   [id number]   Appended Document: 4 MONTH FU///KT  Laboratory Results   Blood Tests     HGBA1C: 7.2%   (Normal Range: Non-Diabetic - 3-6%   Control Diabetic - 6-8%)

## 2010-08-08 NOTE — Assessment & Plan Note (Signed)
Summary: Facial Pain s/p Fall   Vital Signs:  Patient profile:   65 year old male Height:      67 inches Weight:      198 pounds BMI:     31.12 Temp:     97.2 degrees F oral Pulse rate:   88 / minute Pulse rhythm:   regular Resp:     18 per minute BP sitting:   110 / 80  (left arm) Cuff size:   large  Vitals Entered By: Arm[place] Shannon (April 06, 2010 11:11 AM) CC: pt has pain and numbness on left side of face x a while now.. pt says he hit his head on boards while trying to put up a fenace... Is Patient Diabetic? No Pain Assessment Patient in pain? no       Does patient need assistance? Functional Status Self care Ambulation Normal   Primary Care Provider:  Julieanne Manson MD  CC:  pt has pain and numbness on left side of face x a while now.. pt says he hit his head on boards while trying to put up a fenace....  History of Present Illness: Home Health RN came out to his house recently and rec he come in to be seen.  He was putting up a heavy fence 2 mos ago.  He turned to get something and the fence fell on him.  He fell to the ground.  He says he heard something pop.  DId not go to the ED.  No syncope.  Feels pain around eye now.  States he has some numbness at times.  Worse at night when he lies down.  No changes in vision.  Has poor dentition.  No fever or chills.  Amada Kingfisher questioned if it could be from his tooth.    Current Medications (verified): 1)  Amlodipine Besylate 5 Mg Tabs (Amlodipine Besylate) .[name].. 1 Tab By Mouth Daily 2)  Gabapentin 300 Mg  Tabs (Gabapentin) .[name].. 1 Tab By Mouth Two Times A Day 3)  Coumadin 2.5 Mg  Tabs (Warfarin Sodium) .... Per Dr. Jacinto Halim 4)  Glimepiride 2 Mg  Tabs (Glimepiride) .[name].. 1 Tab Po Two Times A Day 5)  Klor-Con M20 20 Meq  Tbcr (Potassium Chloride Crys Cr) .[name].. 1 Tab By Mouth Daily 6)  Coumadin 5 Mg  Solr (Warfarin Sodium) .[name].. 1 Tab By Mouth Daily 7)  Furosemide 20 Mg Tabs (Furosemide) .[name].. 1 Tab By Mouth Daily 8)   Ramipril 10 Mg  Caps (Ramipril) .[name].. 1 Tab By Mouth Two Times A Day 9)  Spiriva Handihaler 18 Mcg  Caps (Tiotropium Bromide Monohydrate) .[name].. 1 Inhalation Daily 10)  Humibid Maximum Strength 1200 Mg  Tb12 (Guaifenesin) .[name].. 1 Tab By Mouth Two Times A Day 11)  Actos 15 Mg  Tabs (Pioglitazone Hcl) .[name].. 1 Tab By Mouth Daily 12)  Flovent Hfa 110 Mcg/act Aero (Fluticasone Propionate  Hfa) .... 2 Puffs Two Times A Day --Brush Teeth and Tongue After Use. 13)  Lovaza 1 Gm Caps (Omega-3-Acid Ethyl Esters) .... 4 Caps By Mouth Daily 14)  Cymbalta 30 Mg Cpep (Duloxetine Hcl) .... 2 Caps By Mouth Daily 15)  Viagra 50 Mg Tabs (Sildenafil Citrate) .[name].. 1-2 Tabs By Mouth 1/2 Hur To 4 Hours Before Activity As Needed.  Limit One Dose Daily 16)  Diabetic Shoes .... Peripheral Neuropathy 17)  Lipitor 80 Mg Tabs (Atorvastatin Calcium) .[name].. 1 Tab By Mouth Daily  Allergies (verified): 1)  ! Codeine  Physical Exam  General:  alert, well-developed, and well-nourished.   Head:  normocephalic and atraumatic.   no pain with pressure over left maxilla no deformity noted light touch sensation over face is intact  Eyes:  right pupil irreg left pupil reactive to light EOM intact Mouth:  poor dentition, teeth missing, edentulous, and excessive plaque.   Neck:  supple.   Lungs:  normal breath sounds.   Heart:  normal rate and regular rhythm.   Neurologic:  alert & oriented X3 and cranial nerves II-XII intact.   Psych:  normally interactive.     Impression & Recommendations:  Problem # 1:  FACIAL PAIN (ICD-784.0) Assessment New  post traumatic exam ok dentition is bad but does not appear to have abscess will get CT to r/o orbital fx and assess for dental abscess  Orders: CT without Contrast (CT w/o contrast)  Complete Medication List: 1)  Amlodipine Besylate 5 Mg Tabs (Amlodipine besylate) .[name].. 1 tab by mouth daily 2)  Gabapentin 300 Mg Tabs (Gabapentin) .[name].. 1 tab by mouth two times a day 3)   Coumadin 2.5 Mg Tabs (Warfarin sodium) .... Per dr. Jacinto Halim 4)  Glimepiride 2 Mg Tabs (Glimepiride) .[name].. 1 tab po two times a day 5)  Klor-con M20 20 Meq Tbcr (Potassium chloride crys cr) .[name].. 1 tab by mouth daily 6)  Coumadin 5 Mg Solr (Warfarin sodium) .[name].. 1 tab by mouth daily 7)  Furosemide 20 Mg Tabs (Furosemide) .[name].. 1 tab by mouth daily 8)  Ramipril 10 Mg Caps (Ramipril) .[name].. 1 tab by mouth two times a day 9)  Spiriva Handihaler 18 Mcg Caps (Tiotropium bromide monohydrate) .[name].. 1 inhalation daily 10)  Humibid Maximum Strength 1200 Mg Tb12 (Guaifenesin) .[name].. 1 tab by mouth two times a day 11)  Actos 15 Mg Tabs (Pioglitazone hcl) .[name].. 1 tab by mouth daily 12)  Flovent Hfa 110 Mcg/act Aero (Fluticasone propionate  hfa) .... 2 puffs two times a day --brush teeth and tongue after use. 13)  Lovaza 1 Gm Caps (Omega-3-acid ethyl esters) .... 4 caps by mouth daily 14)  Cymbalta 30 Mg Cpep (Duloxetine hcl) .... 2 caps by mouth daily 15)  Viagra 50 Mg Tabs (Sildenafil citrate) .[name].. 1-2 tabs by mouth 1/2 hur to 4 hours before activity as needed.  limit one dose daily 16)  Diabetic Shoes  .... Peripheral neuropathy 17)  Lipitor 80 Mg Tabs (Atorvastatin calcium) .[name].. 1 tab by mouth daily  Patient Instructions: 1)  Apply warm compresses as needed to your face. 2)  Use Tylenol as needed for pain. 3)  Get the CT scan done.  4)  Follow up with Dr. Delrae Alfred as planned.  Return sooner is symptoms worsen.  Appended Document: Facial Pain s/p Fall CT MAXILLOFACIAL W/O CM - [id number]   Clinical Data: 65 year old male status post fall onto effaced 2 weeks ago and additional facial injury 1 week earlier.  Query fracture and/or dental abscess.   CT MAXILLOFACIAL WITHOUT CONTRAST   Technique:  Multidetector CT imaging of the maxillofacial structures was performed. Multiplanar CT image reconstructions were also generated.   Comparison: Head CT without contrast 02/24/2008.   Findings: Stable visualized  noncontrast brain parenchyma, within normal limits for age.  Visualized deep soft tissue spaces of the face and neck are within normal limits aside from calcified atherosclerosis of the carotid arteries. Visualized orbit soft tissues are within normal limits.  Stable chronic-appearing mild deformity of the right nasal bone.  Stable chronic left lamina papyracea fracture.  The mandible is  intact.  The chronic right maxillary sinus mucous retention cyst.  Other visualized paranasal sinuses and mastoids are clear.  Mild for age degenerative changes in the visualized cervical spine including ligamentous hypertrophy and calcification about the odontoid.  No acute facial fracture.   There are multifocal dental caries, mostly involving the left and central maxillary dentition.  The residual right mandibular molar does demonstrate mild periapical lucency, but there is no odontogenic soft tissue abscess.   IMPRESSION: 1. No acute facial fracture.  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 2.  Multifocal dental caries without acute dental abscess identified.  Recommend dental follow up.   Read By:  Augusto Gamble,  M.D.     Released By:  Augusto Gamble,  M.D.  _____________________________________________________________________  External Attachment:    Type:     Image     Comment:  CT MAXILLOFACIAL W/O CM - [id number]  Signed by Tereso Newcomer PA-C on 04/07/2010 at 1:42 PM  ________________________________________________________________________ old fx of nose (from many years ago) nothing acute facial symptoms should resolve over time he does have poor dentition recommend he follow up with his dentist next available appt Tereso Newcomer PA-C  April 07, 2010 1:42 PM

## 2010-08-08 NOTE — Letter (Signed)
Summary: [facility]MAILED  [facility]MAILED   Imported By: Arta Bruce 05/03/2010 10:57:34  _____________________________________________________________________  External Attachment:    Type:   Image     Comment:   External Document

## 2010-08-08 NOTE — Letter (Signed)
Summary: DURABLE MEDICAL EQUPMENT/FAXED  DURABLE MEDICAL EQUPMENT/FAXED   Imported By: Arta Bruce 03/14/2010 14:33:09  _____________________________________________________________________  External Attachment:    Type:   Image     Comment:   External Document

## 2010-08-08 NOTE — Progress Notes (Signed)
Summary: DROPPED OFF MEDICAL NECCISSITY FORM  Phone Note Call from Patient Call back at [facility] Phone [phone]   Summary of Call: Wayne [name] PT. [name] DROPPED OFF A MEDICAID MEDICAL NECCISSITY FORM TO BE FILLED OUT SO HE CAN GET HIS DIABETIC SHOES. AFTER ITS FILLED OUT, WE CAN FAX IT AND THEN MAIL THE ORIGINAL TO [facility]. Initial call taken by: Leodis Rains,  Nov 23, 2009 2:27 PM  Follow-up for Phone Call        done Follow-up by: Julieanne Manson MD,  Dec 05, 2009 12:49 AM

## 2010-08-08 NOTE — Assessment & Plan Note (Signed)
Summary: lst seen march ///kt   Vital Signs:  Patient profile:   65 year old male Weight:      199.1 pounds Temp:     97.0 degrees F oral Pulse rate:   76 / minute Pulse rhythm:   regular Resp:     20 per minute BP sitting:   110 / 80  (left arm) Cuff size:   regular  Vitals Entered By: Levon Hedger (March 17, 2010 12:17 PM) CC: discuss medication Is Patient Diabetic? Yes Pain Assessment Patient in pain? no      CBG Result 107 CBG Device ID B  Does patient need assistance? Functional Status Self care Ambulation Normal   CC:  discuss medication.  History of Present Illness: 1.  Hyperlipidemia: Cholesterol best it has been in some time, though LDL still not below 70--at 118 currently.  Was switched from Caduet to 80 mg of Lipitor and continues on Lovaza at maximal dose.  Discussed that this is what we may have to accept.  2.  DM:  does not check sugars.  Very physically active.   Has not had eyes checked this year.  3.  CAD:  Still smoking 1/2 ppd.  Not interested in quitting.  No chest pain.  No dyspnea.   Again, physically active.  Followed by Dr. Thurmon Fair at [facility].  4.  Anticoagulation:  would like to get protimes here from now on--due today--last checked 3 weeks ago--states he was at a good level then.  Current Medications (verified): 1)  Caduet 5-40 Mg  Tabs (Amlodipine-Atorvastatin) .[name].. 1 Tab By Mouth Daily 2)  Gabapentin 300 Mg  Tabs (Gabapentin) .[name].. 1 Tab By Mouth Two Times A Day 3)  Coumadin 2.5 Mg  Tabs (Warfarin Sodium) .... Per Dr. Jacinto Halim 4)  Glimepiride 2 Mg  Tabs (Glimepiride) .[name].. 1 Tab Po Two Times A Day 5)  Klor-Con M20 20 Meq  Tbcr (Potassium Chloride Crys Cr) .[name].. 1 Tab By Mouth Daily 6)  Coumadin 5 Mg  Solr (Warfarin Sodium) .... [facility] 7)  Furosemide 20 Mg Tabs (Furosemide) .[name].. 1 Tab By Mouth Daily 8)  Ramipril 10 Mg  Caps (Ramipril) .[name].. 1 Tab By Mouth Two Times A Day 9)  Spiriva Handihaler 18 Mcg   Caps (Tiotropium Bromide Monohydrate) .[name].. 1 Inhalation Daily 10)  Humibid Maximum Strength 1200 Mg  Tb12 (Guaifenesin) .[name].. 1 Tab By Mouth Two Times A Day 11)  Actos 15 Mg  Tabs (Pioglitazone Hcl) .[name].. 1 Tab By Mouth Daily 12)  Flovent Hfa 110 Mcg/act Aero (Fluticasone Propionate  Hfa) .... 2 Puffs Two Times A Day --Brush Teeth and Tongue After Use. 13)  Lovaza 1 Gm Caps (Omega-3-Acid Ethyl Esters) .... 4 Caps By Mouth Daily 14)  Cymbalta 30 Mg Cpep (Duloxetine Hcl) .... 2 Caps By Mouth Daily 15)  Viagra 50 Mg Tabs (Sildenafil Citrate) .[name].. 1-2 Tabs By Mouth 1/2 Hur To 4 Hours Before Activity As Needed.  Limit One Dose Daily 16)  Diabetic Shoes .... Peripheral Neuropathy  Allergies (verified): 1)  ! Codeine  Physical Exam  General:  Smells of cigarette smoke Lungs:  Normal respiratory effort, chest expands symmetrically. Lungs are clear to auscultation, no crackles or wheezes. Heart:  Irregularly irregular,  S1 and S2 normal without gallop, murmur, click, rub or other extra sounds.  Radial pulses normal and equal. Extremities:  no edema  Diabetes Management Exam:    Foot Exam (with socks and/or shoes not present):  Sensory-Monofilament:          Left foot: normal          Right foot: normal   Impression & Recommendations:  Problem # 1:  DYSLIPIDEMIA (ICD-272.4) Send results to Dr. Rubie Maid His updated medication list for this problem includes:    Lovaza 1 Gm Caps (Omega-3-acid ethyl esters) .Marland KitchenMarland KitchenMarland KitchenMarland Kitchen 4 caps by mouth daily    Lipitor 80 Mg Tabs (Atorvastatin calcium) .[name]... 1 tab by mouth daily  Problem # 2:  DIABETES MELLITUS, TYPE II, UNCONTROLLED (ICD-250.02) Fair control Encouraged to get set up with an eye check. His updated medication list for this problem includes:    Glimepiride 2 Mg Tabs (Glimepiride) .[name]... 1 tab po two times a day    Ramipril 10 Mg Caps (Ramipril) .[name]... 1 tab by mouth two times a day    Actos 15 Mg Tabs (Pioglitazone hcl) .[name]... 1 tab by mouth  daily  Problem # 3:  FIBRILLATION, ATRIAL (ICD-427.31)  His updated medication list for this problem includes:    Amlodipine Besylate 5 Mg Tabs (Amlodipine besylate) .[name]... 1 tab by mouth daily    Coumadin 2.5 Mg Tabs (Warfarin sodium) .[name]... Per dr. Jacinto Halim    Coumadin 5 Mg Solr (Warfarin sodium) .[name]... 1 tab by mouth daily  Orders: T-Protime, Auto ([id number]) today  Problem # 4:  CAD (ICD-414.00) Stable His updated medication list for this problem includes:    Amlodipine Besylate 5 Mg Tabs (Amlodipine besylate) .[name]... 1 tab by mouth daily    Furosemide 20 Mg Tabs (Furosemide) .[name]... 1 tab by mouth daily    Ramipril 10 Mg Caps (Ramipril) .[name]... 1 tab by mouth two times a day  Complete Medication List: 1)  Amlodipine Besylate 5 Mg Tabs (Amlodipine besylate) .[name].. 1 tab by mouth daily 2)  Gabapentin 300 Mg Tabs (Gabapentin) .[name].. 1 tab by mouth two times a day 3)  Coumadin 2.5 Mg Tabs (Warfarin sodium) .... Per dr. Jacinto Halim 4)  Glimepiride 2 Mg Tabs (Glimepiride) .[name].. 1 tab po two times a day 5)  Klor-con M20 20 Meq Tbcr (Potassium chloride crys cr) .[name].. 1 tab by mouth daily 6)  Coumadin 5 Mg Solr (Warfarin sodium) .[name].. 1 tab by mouth daily 7)  Furosemide 20 Mg Tabs (Furosemide) .[name].. 1 tab by mouth daily 8)  Ramipril 10 Mg Caps (Ramipril) .[name].. 1 tab by mouth two times a day 9)  Spiriva Handihaler 18 Mcg Caps (Tiotropium bromide monohydrate) .[name].. 1 inhalation daily 10)  Humibid Maximum Strength 1200 Mg Tb12 (Guaifenesin) .[name].. 1 tab by mouth two times a day 11)  Actos 15 Mg Tabs (Pioglitazone hcl) .[name].. 1 tab by mouth daily 12)  Flovent Hfa 110 Mcg/act Aero (Fluticasone propionate  hfa) .... 2 puffs two times a day --brush teeth and tongue after use. 13)  Lovaza 1 Gm Caps (Omega-3-acid ethyl esters) .... 4 caps by mouth daily 14)  Cymbalta 30 Mg Cpep (Duloxetine hcl) .... 2 caps by mouth daily 15)  Viagra 50 Mg Tabs (Sildenafil citrate) .[name].. 1-2 tabs by mouth 1/2 hur to 4 hours before activity as  needed.  limit one dose daily 16)  Diabetic Shoes  .... Peripheral neuropathy 17)  Lipitor 80 Mg Tabs (Atorvastatin calcium) .[name].. 1 tab by mouth daily  Other Orders: Capillary Blood Glucose/CBG (09811)  Patient Instructions: 1)  Please get your eyes checked with your diabetes 2)  Follow up with Dr. Delrae Alfred in 4 months -- 3)  lab visit for protiime in 1  month  Last LDL:                                                 171 (11/30/2009 9:48:00 PM)        Diabetic Foot Exam    10-g (5.07) Semmes-Weinstein Monofilament Test Performed by: Levon Hedger          Right Foot          Left Foot Visual Inspection               Test Control      normal         normal Site 1         normal         normal Site 2         normal         normal Site 3         normal         normal Site 4         normal         normal Site 5         normal         normal Site 6         normal         normal Site 7         normal         normal Site 8         normal         normal Site 9         normal         normal Site 10         normal         normal  Impression      normal         normal

## 2010-08-08 NOTE — Letter (Signed)
Summary: [facility]//MAILED  [facility]//MAILED   Imported By: Arta Bruce 08/01/2009 08:31:13  _____________________________________________________________________  External Attachment:    Type:   Image     Comment:   External Document

## 2010-08-10 NOTE — Assessment & Plan Note (Signed)
Summary: FOLLOWUP.Wayne KitchenMarland KitchenCM   Vital Signs:  Patient profile:   65 year old male Weight:      198.19 pounds Temp:     97.5 degrees F oral Pulse rate:   84 / minute Pulse rhythm:   regular Resp:     18 per minute BP sitting:   130 / 86  (left arm) Cuff size:   regular  Vitals Entered By: Hale Drone CMA (July 11, 2010 10:34 AM) CC: f/u from 03/17/10. Would like for you to look at a scab on his lower back. It itches but no pain. Is Patient Diabetic? Yes Pain Assessment Patient in pain? no      CBG Result 214 CBG Device ID A non fasting  Does patient need assistance? Functional Status Self care Ambulation Normal   Primary Care Provider:  Julieanne Manson MD  CC:  f/u from 03/17/10. Would like for you to look at a scab on his lower back. It itches but no pain.Wayne [name]  History of Present Illness: 1.  Anticoagulation:  Pt. reportedly had missed his Coumadin before last protime.  Has again been missing his Coumadin.  Does not have with his meds today--states has not picked up from pharmacy as of yet.  He is not sure what his dose of any medication is.  2.  DM:  sugars controlled.  Flu shot given.  Not checking sugars at home. Did have eyes checked this year with Dr.   Mitzi Davenport.  No diabetic changes per pt.  Does have cataracts bilaterally  3.  Bump in low back.  Scratches until bleeds.  Has noted for past 1-2 months  4.  Tobacco Abuse:  1/2 ppd.  No interest in quitting.    5.  CAD:  no chest pain:  Still very physically active.  Current Medications (verified): 1)  Amlodipine Besylate 5 Mg Tabs (Amlodipine Besylate) .Wayne [name].. 1 Tab By Mouth Daily 2)  Gabapentin 300 Mg  Tabs (Gabapentin) .Wayne [name].. 1 Tab By Mouth Two Times A Day 3)  Coumadin 2.5 Mg  Tabs (Warfarin Sodium) .... Per Dr. Jacinto Halim 4)  Glimepiride 2 Mg  Tabs (Glimepiride) .Wayne [name].. 1 Tab Po Two Times A Day 5)  Klor-Con M20 20 Meq  Tbcr (Potassium Chloride Crys Cr) .Wayne [name].. 1 Tab By Mouth Daily 6)  Coumadin 5 Mg  Solr (Warfarin Sodium) .Wayne [name].. 1  Tab By Mouth Daily 7)  Furosemide 20 Mg Tabs (Furosemide) .Wayne [name].. 1 Tab By Mouth Daily 8)  Ramipril 10 Mg  Caps (Ramipril) .Wayne [name].. 1 Tab By Mouth Two Times A Day 9)  Spiriva Handihaler 18 Mcg  Caps (Tiotropium Bromide Monohydrate) .Wayne [name].. 1 Inhalation Daily 10)  Humibid Maximum Strength 1200 Mg  Tb12 (Guaifenesin) .Wayne [name].. 1 Tab By Mouth Two Times A Day 11)  Actos 15 Mg  Tabs (Pioglitazone Hcl) .Wayne [name].. 1 Tab By Mouth Daily 12)  Flovent Hfa 110 Mcg/act Aero (Fluticasone Propionate  Hfa) .... 2 Puffs Two Times A Day --Brush Teeth and Tongue After Use. 13)  Lovaza 1 Gm Caps (Omega-3-Acid Ethyl Esters) .... 4 Caps By Mouth Daily 14)  Cymbalta 30 Mg Cpep (Duloxetine Hcl) .... 2 Caps By Mouth Daily 15)  Viagra 50 Mg Tabs (Sildenafil Citrate) .Wayne [name].. 1-2 Tabs By Mouth 1/2 Hur To 4 Hours Before Activity As Needed.  Limit One Dose Daily 16)  Diabetic Shoes .... Peripheral Neuropathy 17)  Lipitor 80 Mg Tabs (Atorvastatin Calcium) .Wayne [name].. 1 Tab By Mouth Daily  Allergies (verified): 1)  ! Codeine  Physical Exam  General:  Smells of cigarette smoke Lungs:  Normal respiratory effort, chest expands symmetrically. Lungs are clear to auscultation, no crackles or wheezes. Heart:  Irreg, irregular rhythm. S1 and S2 normal without gallop, murmur, click, rub or other extra sounds.  Radial pulses normal and equal Skin:  4mm lesion in low back with tiny scab--well circumbscribed light brown, slightly raised an oily   Impression & Recommendations:  Problem # 1:  FIBRILLATION, ATRIAL (ICD-427.31) Called [name]. Has not picked up Coumadin Rx since 05/17/10 Urged pt. to be compliant The following medications were removed from the medication list:    Coumadin 2.5 Mg Tabs (Warfarin sodium) .Wayne [name]... Per dr. Jacinto Halim His updated medication list for this problem includes:    Amlodipine Besylate 5 Mg Tabs (Amlodipine besylate) .Wayne [name]... 1 tab by mouth daily    Coumadin 5 Mg Solr (Warfarin sodium) .Wayne [name]... 1 tab by mouth daily  Problem # 2:   DIABETES MELLITUS, TYPE II, UNCONTROLLED (ICD-250.02) A1C 6.6% today--good control His updated medication list for this problem includes:    Glimepiride 2 Mg Tabs (Glimepiride) .Wayne [name]... 1 tab po two times a day    Ramipril 10 Mg Caps (Ramipril) .Wayne [name]... 1 tab by mouth two times a day    Actos 15 Mg Tabs (Pioglitazone hcl) .Wayne [name]... 1 tab by mouth daily  Problem # 3:  DISEASE, ISCHEMIC HEART, CHRONIC NEC (ICD-414.8) Controlled currently Strongly urged to quit smoking His updated medication list for this problem includes:    Amlodipine Besylate 5 Mg Tabs (Amlodipine besylate) .Wayne [name]... 1 tab by mouth daily    Furosemide 20 Mg Tabs (Furosemide) .Wayne [name]... 1 tab by mouth daily    Ramipril 10 Mg Caps (Ramipril) .Wayne [name]... 1 tab by mouth two times a day  Problem # 4:  SEBORRHEIC KERATOSIS (ICD-702.19) Discussed benign Pt. feels he can leave alone, did not want to treat with silver nitrate to remove  Problem # 5:  ESSENTIAL HYPERTENSION (ICD-401.9) Controlled His updated medication list for this problem includes:    Amlodipine Besylate 5 Mg Tabs (Amlodipine besylate) .Wayne [name]... 1 tab by mouth daily    Furosemide 20 Mg Tabs (Furosemide) .Wayne [name]... 1 tab by mouth daily    Ramipril 10 Mg Caps (Ramipril) .Wayne [name]... 1 tab by mouth two times a day  Complete Medication List: 1)  Amlodipine Besylate 5 Mg Tabs (Amlodipine besylate) .Wayne [name].. 1 tab by mouth daily 2)  Gabapentin 300 Mg Tabs (Gabapentin) .Wayne [name].. 1 tab by mouth two times a day 3)  Glimepiride 2 Mg Tabs (Glimepiride) .Wayne [name].. 1 tab po two times a day 4)  Klor-con M20 20 Meq Tbcr (Potassium chloride crys cr) .Wayne [name].. 1 tab by mouth daily 5)  Coumadin 5 Mg Solr (Warfarin sodium) .Wayne [name].. 1 tab by mouth daily 6)  Furosemide 20 Mg Tabs (Furosemide) .Wayne [name].. 1 tab by mouth daily 7)  Ramipril 10 Mg Caps (Ramipril) .Wayne [name].. 1 tab by mouth two times a day 8)  Spiriva Handihaler 18 Mcg Caps (Tiotropium bromide monohydrate) .Wayne [name].. 1 inhalation daily 9)  Humibid Maximum Strength 1200 Mg Tb12 (Guaifenesin) .Wayne [name].. 1  tab by mouth two times a day 10)  Actos 15 Mg Tabs (Pioglitazone hcl) .Wayne [name].. 1 tab by mouth daily 11)  Flovent Hfa 110 Mcg/act Aero (Fluticasone propionate  hfa) .... 2 puffs two times a day --brush teeth and tongue after use. 12)  Lovaza 1 Gm Caps (Omega-3-acid ethyl esters) .... 4 caps by mouth daily 13)  Cymbalta 30 Mg Cpep (Duloxetine hcl) .... 2 caps by mouth daily 14)  Viagra  50 Mg Tabs (Sildenafil citrate) .Wayne [name].. 1-2 tabs by mouth 1/2 hur to 4 hours before activity as needed.  limit one dose daily 15)  Diabetic Shoes  .... Peripheral neuropathy 16)  Lipitor 80 Mg Tabs (Atorvastatin calcium) .Wayne [name].. 1 tab by mouth daily  Other Orders: Capillary Blood Glucose/CBG (21308) Flu Vaccine 73yrs + (65784) Admin 1st Vaccine (69629)  Patient Instructions: 1)  lab visit in 2 weeks for protime 2)  Follow up with Dr. Delrae Alfred in 6 months  3)  . Prescriptions: COUMADIN 5 MG  SOLR (WARFARIN SODIUM) 1 tab by mouth daily  #30 x 6   Entered and Authorized by:   Julieanne Manson MD   Signed by:   Julieanne Manson MD on 07/11/2010   Method used:   Electronically to        [name] [id number] (retail)       [address] [place], [place]  [address]       Ph: [phone]       Fax: [phone]   RxID:   [id number]    Orders Added: 1)  Capillary Blood Glucose/CBG [51884] 2)  Flu Vaccine 35yrs + [16606] 3)  Admin 1st Vaccine [30160]   Immunizations Administered:  Influenza Vaccine # 1:    Vaccine Type: Fluvax 3+    Site: left deltoid    Mfr: GlaxoSmithKline    Dose: 0.5 ml    Route: IM    Given by: Hale Drone CMA    Exp. Date: 01/06/2011    Lot #: [id number]    VIS given: 01/31/10 version given July 11, 2010.  Flu Vaccine Consent Questions:    Do you have a history of severe allergic reactions to this vaccine? no    Any prior history of allergic reactions to egg and/or gelatin? no    Do you have a sensitivity to the preservative Thimersol? no     Do you have a past history of Guillan-Barre Syndrome? no    Do you currently have an acute febrile illness? no    Have you ever had a severe reaction to latex? no    Vaccine information given and explained to patient? yes   Immunizations Administered:  Influenza Vaccine # 1:    Vaccine Type: Fluvax 3+    Site: left deltoid    Mfr: GlaxoSmithKline    Dose: 0.5 ml    Route: IM    Given by: Hale Drone CMA    Exp. Date: 01/06/2011    Lot #: [id number]    VIS given: 01/31/10 version given July 11, 2010.    Appended Document: Lab Order    Lab Visit  Laboratory Results   Blood Tests   Date/Time Received: July 11, 2010 11:22 AM   HGBA1C: 6.6%   (Normal Range: Non-Diabetic - 3-6%   Control Diabetic - 6-8%)    Orders Today: Hemoglobin A1C [83036]   Appended Document: FOLLOWUP.Wayne KitchenMarland KitchenCM    Nurse Visit   Current Medications (verified): 1)  Amlodipine Besylate 5 Mg Tabs (Amlodipine Besylate) .Wayne [name].. 1 Tab By Mouth Daily 2)  Gabapentin 300 Mg  Tabs (Gabapentin) .Wayne [name].. 1 Tab By Mouth Two Times A Day 3)  Glimepiride 2 Mg  Tabs (Glimepiride) .Wayne [name].. 1 Tab Po Two Times A Day 4)  Klor-Con M20 20 Meq  Tbcr (Potassium Chloride Crys Cr) .Wayne [name].. 1 Tab By Mouth Daily 5)  Warfarin Sodium  5 Mg Tabs (Warfarin Sodium) 6)  Furosemide 20 Mg Tabs (Furosemide) .Wayne [name].. 1 Tab By Mouth Daily 7)  Ramipril 10 Mg  Caps (Ramipril) .Wayne [name].. 1 Tab By Mouth Two Times A Day 8)  Spiriva Handihaler 18 Mcg  Caps (Tiotropium Bromide Monohydrate) .Wayne [name].. 1 Inhalation Daily 9)  Humibid Maximum Strength 1200 Mg  Tb12 (Guaifenesin) .Wayne [name].. 1 Tab By Mouth Two Times A Day 10)  Actos 15 Mg  Tabs (Pioglitazone Hcl) .Wayne [name].. 1 Tab By Mouth Daily 11)  Flovent Hfa 110 Mcg/act Aero (Fluticasone Propionate  Hfa) .... 2 Puffs Two Times A Day --Brush Teeth and Tongue After Use. 12)  Lovaza 1 Gm Caps (Omega-3-Acid Ethyl Esters) .... 4 Caps By Mouth Daily 13)  Cymbalta 30 Mg Cpep (Duloxetine Hcl) .... 2 Caps By Mouth Daily 14)  Viagra 50 Mg  Tabs (Sildenafil Citrate) .Wayne [name].. 1-2 Tabs By Mouth 1/2 Hur To 4 Hours Before Activity As Needed.  Limit One Dose Daily 15)  Diabetic Shoes .... Peripheral Neuropathy 16)  Lipitor 80 Mg Tabs (Atorvastatin Calcium) .Wayne [name].. 1 Tab By Mouth Daily  Allergies (verified): 1)  ! Codeine   Appended Document: FOLLOWUP.Wayne KitchenMarland KitchenCM    Clinical Lists Changes  Orders: Added new Service order of Est. Patient Level IV (29562) - Signed

## 2010-09-07 ENCOUNTER — Encounter (INDEPENDENT_AMBULATORY_CARE_PROVIDER_SITE_OTHER): Payer: Self-pay | Admitting: Internal Medicine

## 2010-09-19 ENCOUNTER — Encounter (INDEPENDENT_AMBULATORY_CARE_PROVIDER_SITE_OTHER): Payer: Self-pay | Admitting: Internal Medicine

## 2010-09-19 NOTE — Letter (Signed)
Summary: ANTICOAGULATION DOSING  ANTICOAGULATION DOSING   Imported By: Arta Bruce 09/11/2010 08:04:50  _____________________________________________________________________  External Attachment:    Type:   Image     Comment:   External Document

## 2010-09-27 ENCOUNTER — Encounter (INDEPENDENT_AMBULATORY_CARE_PROVIDER_SITE_OTHER): Payer: Self-pay | Admitting: Internal Medicine

## 2010-09-27 LAB — GLUCOSE, CAPILLARY
Glucose-Capillary: 108 mg/dL — ABNORMAL HIGH (ref 70–99)
Glucose-Capillary: 161 mg/dL — ABNORMAL HIGH (ref 70–99)
Glucose-Capillary: 95 mg/dL (ref 70–99)

## 2010-09-27 LAB — PREPARE FRESH FROZEN PLASMA

## 2010-09-27 LAB — CROSSMATCH

## 2010-09-27 LAB — BASIC METABOLIC PANEL
GFR calc Af Amer: >60 mL/min (ref >60)
GFR calc non Af Amer: >60 mL/min (ref >60)
Potassium: 4.6 mEq/L (ref 3.5–5.1)
Sodium: 140 mEq/L (ref 135–145)

## 2010-09-27 LAB — DIFFERENTIAL
Eosinophils Relative: 2 % (ref 0–5)
Lymphocytes Relative: 44 % (ref 12–46)
Lymphs Abs: 3.9 10*3/uL (ref 0.7–4.0)
Monocytes Absolute: 0.6 10*3/uL (ref 0.1–1.0)

## 2010-09-27 LAB — CBC
HCT: 45.6 % (ref 39.0–52.0)
Hemoglobin: 15.4 g/dL (ref 13.0–17.0)
RBC: 4.77 MIL/uL (ref 4.22–5.81)
WBC: 8.8 10*3/uL (ref 4.0–10.5)

## 2010-09-27 LAB — PROTIME-INR: INR: 1.73 — ABNORMAL HIGH (ref 0.00–1.49)

## 2010-09-27 LAB — APTT: aPTT: 50 seconds — ABNORMAL HIGH (ref 24–37)

## 2010-10-03 ENCOUNTER — Encounter (INDEPENDENT_AMBULATORY_CARE_PROVIDER_SITE_OTHER): Payer: Self-pay | Admitting: *Deleted

## 2010-10-03 ENCOUNTER — Encounter (INDEPENDENT_AMBULATORY_CARE_PROVIDER_SITE_OTHER): Payer: Self-pay | Admitting: Internal Medicine

## 2010-10-03 ENCOUNTER — Encounter: Payer: Self-pay | Admitting: Internal Medicine

## 2010-10-03 ENCOUNTER — Telehealth (INDEPENDENT_AMBULATORY_CARE_PROVIDER_SITE_OTHER): Payer: Self-pay | Admitting: Internal Medicine

## 2010-10-03 DIAGNOSIS — Z8719 Personal history of other diseases of the digestive system: Secondary | ICD-10-CM | POA: Insufficient documentation

## 2010-10-03 LAB — CONVERTED CEMR LAB
Basophils Absolute: 0 10*3/uL (ref 0.0–0.1)
Basophils Relative: 0 % (ref 0–1)
Eosinophils Absolute: 0.2 10*3/uL (ref 0.0–0.7)
Eosinophils Relative: 3 % (ref 0–5)
HCT: 47.3 % (ref 39.0–52.0)
Hemoglobin: 15.7 g/dL (ref 13.0–17.0)
MCHC: 33.2 g/dL (ref 30.0–36.0)
MCV: 96.1 fL (ref 78.0–100.0)
Monocytes Absolute: 0.5 10*3/uL (ref 0.1–1.0)
Platelets: 150 10*3/uL (ref 150–400)
RDW: 13.7 % (ref 11.5–15.5)

## 2010-10-05 NOTE — Letter (Signed)
Summary: ANTICOAGULATION DOSING  ANTICOAGULATION DOSING   Imported By: Arta Bruce 09/28/2010 15:06:54  _____________________________________________________________________  External Attachment:    Type:   Image     Comment:   External Document

## 2010-10-05 NOTE — Letter (Signed)
Summary: ANTICOAGULATION DOSING  ANTICOAGULATION DOSING   Imported By: Arta Bruce 09/28/2010 14:34:38  _____________________________________________________________________  External Attachment:    Type:   Image     Comment:   External Document

## 2010-10-10 NOTE — Letter (Signed)
Summary: New Patient letter  [facility]  [address] [place], [place] [address]   Phone: [phone]  Fax: [phone]       10/03/2010 MRN: [id number]  Wayne [name] [address] [place], [place]  [address]  Dear Mr. Mattes,  Welcome to the Gastroenterology Division at [facility].    You are scheduled to see Dr.  Claudette Head on Nov 13, 2010 at 10:00am on the 3rd floor at [facility], [address]  We ask that you try to arrive at our office 15 minutes prior to your appointment time to allow for check-in.  We would like you to complete the enclosed self-administered evaluation form prior to your visit and bring it with you on the day of your appointment.  We will review it with you.  Also, please bring a complete list of all your medications or, if you prefer, bring the medication bottles and we will list them.  Please bring your insurance card so that we may make a copy of it.  If your insurance requires a referral to see a specialist, please bring your referral form from your primary care physician.  Co-payments are due at the time of your visit and may be paid by cash, check or credit card.     Your office visit will consist of a consult with your physician (includes a physical exam), any laboratory testing he/she may order, scheduling of any necessary diagnostic testing (e.g. x-ray, ultrasound, CT-scan), and scheduling of a procedure (e.g. Endoscopy, Colonoscopy) if required.  Please allow enough time on your schedule to allow for any/all of these possibilities.    If you cannot keep your appointment, please call [phone] to cancel or reschedule prior to your appointment date.  This allows [place] the opportunity to schedule an appointment for another patient in need of care.  If you do not cancel or reschedule by 5 p.m. the business day prior to your appointment date, you will be charged a $50.00 late cancellation/no-show fee.    Thank you for  choosing  Gastroenterology for your medical needs.  We appreciate the opportunity to care for you.  Please visit [place] at our website  to learn more about our practice.                     Sincerely,                                                             The Gastroenterology Division

## 2010-10-10 NOTE — Letter (Signed)
Summary: ANTICOAGULATION DOSING  ANTICOAGULATION DOSING   Imported By: Arta Bruce 10/05/2010 15:51:05  _____________________________________________________________________  External Attachment:    Type:   Image     Comment:   External Document

## 2010-10-10 NOTE — Letter (Signed)
Summary: *Referral Letter  [facility]  [address] [place], [place] [address]   Phone: [phone]  Fax: [phone]    10/03/2010  Thank you in advance for agreeing to see my patient:  Wayne [name] [address] [place], [place]  [address]  Phone: [phone]  Reason for Referral: Has not had screening colonoscopy.  Hx end of January with rectal bleeding, lasting for a week with each formed BM.  Nothing since.  Does have a history of hemorrhoids.  Guaiac Cards and CBC pending.  He is on Coumadin, which can be held for the procedure, to be restarted subsequently.  He will need to follow up with our Coumadin Clinic.  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.4) 8)  OSTEOARTHRITIS, SHOULDER, LEFT (ICD-715.91) 9)  ENCOUNTER FOR LONG-TERM USE OF OTHER MEDICATIONS (ICD-V58.69) 10)  LEFT ILIOTIBIAL BAND SYNDROME (ICD-728.89) 11)  ESSENTIAL HYPERTENSION (ICD-401.9) 12)  DIABETES MELLITUS, TYPE II, UNCONTROLLED (ICD-250.02) 13)  CARPAL TUNNEL SYNDROME, RIGHT (ICD-354.0) 14)  CARPAL TUNNEL RELEASE, LEFT, HX OF (ICD-V45.89) 15)  CLAUDICATION (ICD-443.9) 16)  COPD (ICD-496) 17)  HEALTH MAINTENANCE EXAM (ICD-V70.0) 18)  BRONCHITIS (ICD-490) 19)  DISEASE, ISCHEMIC HEART, CHRONIC NEC (ICD-414.8) 20)  PERCUTANEOUS TRANSLUMINAL CORONARY ANGIOPLASTY, HX OF (ICD-V45.82) 21)  FIBRILLATION, ATRIAL (ICD-427.31) 22)  NICOTINE ADDICTION (ICD-305.1) 23)  PERIPHERAL VASCULAR DISEASE (ICD-443.9) 24)  CAD (ICD-414.00) 25)  DYSLIPIDEMIA (ICD-272.4)   Current Medications: 1)  AMLODIPINE BESYLATE 5 MG TABS (AMLODIPINE BESYLATE) 1 tab by mouth daily 2)   GABAPENTIN 300 MG  TABS (GABAPENTIN) 1 tab by mouth two times a day 3)  GLIMEPIRIDE 2 MG  TABS (GLIMEPIRIDE) 1 tab po two times a day 4)  KLOR-CON M20 20 MEQ  TBCR (POTASSIUM CHLORIDE CRYS CR) 1 tab by mouth daily 5)  WARFARIN SODIUM 5 MG TABS (WARFARIN SODIUM)  6)  FUROSEMIDE 20 MG TABS (FUROSEMIDE) 1 tab by mouth daily 7)  RAMIPRIL 10 MG  CAPS (RAMIPRIL) 1 tab by mouth two times a day 8)  SPIRIVA HANDIHALER 18 MCG  CAPS (TIOTROPIUM BROMIDE MONOHYDRATE) 1 inhalation daily 9)  HUMIBID MAXIMUM STRENGTH 1200 MG  TB12 (GUAIFENESIN) 1 tab by mouth two times a day 10)  ACTOS 15 MG  TABS (PIOGLITAZONE HCL) 1 tab by mouth daily 11)  FLOVENT HFA 110 MCG/ACT AERO (FLUTICASONE PROPIONATE  HFA) 2 puffs two times a day --brush teeth and tongue after use. 12)  LOVAZA 1 GM CAPS (OMEGA-3-ACID ETHYL ESTERS) 4 caps by mouth daily 13)  CYMBALTA 30 MG CPEP (DULOXETINE HCL) 2 caps by mouth daily 14)  VIAGRA 50 MG TABS (SILDENAFIL CITRATE) 1-2 tabs by mouth 1/2 hur to 4 hours before activity as needed.  Limit one dose daily 15)  * DIABETIC SHOES peripheral neuropathy 16)  LIPITOR 80 MG TABS (ATORVASTATIN CALCIUM) 1 tab by mouth daily   Past Medical History: 1)  Current Problems:  2)  DISEASE, ISCHEMIC HEART, CHRONIC NEC (ICD-414.8) 3)  PERCUTANEOUS TRANSLUMINAL CORONARY ANGIOPLASTY, HX OF (ICD-V45.82) 4)  FIBRILLATION, ATRIAL (ICD-427.31) 5)  NICOTINE ADDICTION (ICD-305.1) 6)  PERIPHERAL VASCULAR DISEASE (ICD-443.9) 7)  HYPERTENSION, HX OF (ICD-V12.50) 8)  DM (ICD-250.00) 9)  CAD (ICD-414.00) 10)  DYSLIPIDEMIA (ICD-272.4)   Prior History of Blood Transfusions:  Pertinent Labs:    Thank you again for agreeing to see our patient; please contact us if you have any further questions or need additional information.  Sincerely,  Julieanne Manson MD

## 2010-10-10 NOTE — Assessment & Plan Note (Signed)
Summary: DM f/u and PT/INR check   Vital Signs:  Patient profile:   65 year old male Weight:      195.06 pounds Temp:     99.2 degrees F oral Pulse rate:   83 / minute Pulse rhythm:   regular Resp:     20 per minute BP sitting:   133 / 81  (left arm) Cuff size:   regular  Vitals Entered By: Hale Drone CMA (October 03, 2010 12:00 PM) CC: DM f/u and PT/INR check Is Patient Diabetic? Yes Pain Assessment Patient in pain? no      CBG Result 297 CBG Device ID [id number] non fasting  Does patient need assistance? Functional Status Self care Ambulation Normal   Primary Care Provider:  Julieanne Manson MD  CC:  DM f/u and PT/INR check.  History of Present Illness: 1.  Hx of bloody stools--see phone note from end of January.  Pt. was to be seen for this 08/09/10, but apparently either cancelled or did not show.  He does not remember.  Pt. states the blood was bright red and around formed stools and in the water, but not mixed in the stool.  He does not recall hard stools.  Was also on tissue.  Noted every time he had a BM for a week.  No melena or real hematochezia.  No associated nausea or vomiting.   No associated anal itching or burning.  Has had this with hemorrhoids before.  No pain passing stools.  Has not recurred since end of January.  Pt. has never had a colonoscopy.  2.  Did have dental work done--tooth was pulled.  Tooth was pulled about 1 1/2 weeks ago.  Due for Coumadin clinic protime today.  3.  DM:  A1C in January was fine.    Does not check sugars at home.  Did have eyes checked this year.  Goes to Dr. Gardiner Barefoot he was told everything was fine.    Current Medications (verified): 1)  Amlodipine Besylate 5 Mg Tabs (Amlodipine Besylate) .[name].. 1 Tab By Mouth Daily 2)  Gabapentin 300 Mg  Tabs (Gabapentin) .[name].. 1 Tab By Mouth Two Times A Day 3)  Glimepiride 2 Mg  Tabs (Glimepiride) .[name].. 1 Tab Po Two Times A Day 4)  Klor-Con M20 20 Meq  Tbcr (Potassium Chloride Crys Cr)  .[name].. 1 Tab By Mouth Daily 5)  Warfarin Sodium 5 Mg Tabs (Warfarin Sodium) 6)  Furosemide 20 Mg Tabs (Furosemide) .[name].. 1 Tab By Mouth Daily 7)  Ramipril 10 Mg  Caps (Ramipril) .[name].. 1 Tab By Mouth Two Times A Day 8)  Spiriva Handihaler 18 Mcg  Caps (Tiotropium Bromide Monohydrate) .[name].. 1 Inhalation Daily 9)  Humibid Maximum Strength 1200 Mg  Tb12 (Guaifenesin) .[name].. 1 Tab By Mouth Two Times A Day 10)  Actos 15 Mg  Tabs (Pioglitazone Hcl) .[name].. 1 Tab By Mouth Daily 11)  Flovent Hfa 110 Mcg/act Aero (Fluticasone Propionate  Hfa) .... 2 Puffs Two Times A Day --Brush Teeth and Tongue After Use. 12)  Lovaza 1 Gm Caps (Omega-3-Acid Ethyl Esters) .... 4 Caps By Mouth Daily 13)  Cymbalta 30 Mg Cpep (Duloxetine Hcl) .... 2 Caps By Mouth Daily 14)  Viagra 50 Mg Tabs (Sildenafil Citrate) .[name].. 1-2 Tabs By Mouth 1/2 Hur To 4 Hours Before Activity As Needed.  Limit One Dose Daily 15)  Diabetic Shoes .... Peripheral Neuropathy 16)  Lipitor 80 Mg Tabs (Atorvastatin Calcium) .[name].. 1 Tab By Mouth Daily  Allergies (verified):  1)  ! Codeine  Physical Exam  General:  NAD Lungs:  Occasional scattered wheeze.   Heart:  Normal rate and regular rhythm. S1 and S2 normal without gallop, murmur, click, rub or other extra sounds. Abdomen:  Bowel sounds positive,abdomen soft and non-tender without masses, organomegaly or hernias noted.   Impression & Recommendations:  Problem # 1:  RECTAL BLEEDING, HX OF (ICD-V12.79)  In January without recurrence since. Hx of hemorrhoids. Send for colonoscopy  Orders: Gastroenterology Referral (GI) T-CBC No Diff ([id number])  Problem # 2:  DIABETES MELLITUS, TYPE II, UNCONTROLLED (ICD-250.02) Controlled recently His updated medication list for this problem includes:    Glimepiride 2 Mg Tabs (Glimepiride) .[name]... 1 tab po two times a day    Ramipril 10 Mg Caps (Ramipril) .[name]... 1 tab by mouth two times a day    Actos 15 Mg Tabs (Pioglitazone hcl) .[name]... 1 tab by mouth  daily  Complete Medication List: 1)  Amlodipine Besylate 5 Mg Tabs (Amlodipine besylate) .[name].. 1 tab by mouth daily 2)  Gabapentin 300 Mg Tabs (Gabapentin) .[name].. 1 tab by mouth two times a day 3)  Glimepiride 2 Mg Tabs (Glimepiride) .[name].. 1 tab po two times a day 4)  Klor-con M20 20 Meq Tbcr (Potassium chloride crys cr) .[name].. 1 tab by mouth daily 5)  Warfarin Sodium 5 Mg Tabs (Warfarin sodium) 6)  Furosemide 20 Mg Tabs (Furosemide) .[name].. 1 tab by mouth daily 7)  Ramipril 10 Mg Caps (Ramipril) .[name].. 1 tab by mouth two times a day 8)  Spiriva Handihaler 18 Mcg Caps (Tiotropium bromide monohydrate) .[name].. 1 inhalation daily 9)  Humibid Maximum Strength 1200 Mg Tb12 (Guaifenesin) .[name].. 1 tab by mouth two times a day 10)  Actos 15 Mg Tabs (Pioglitazone hcl) .[name].. 1 tab by mouth daily 11)  Flovent Hfa 110 Mcg/act Aero (Fluticasone propionate  hfa) .... 2 puffs two times a day --brush teeth and tongue after use. 12)  Lovaza 1 Gm Caps (Omega-3-acid ethyl esters) .... 4 caps by mouth daily 13)  Cymbalta 30 Mg Cpep (Duloxetine hcl) .... 2 caps by mouth daily 14)  Viagra 50 Mg Tabs (Sildenafil citrate) .[name].. 1-2 tabs by mouth 1/2 hur to 4 hours before activity as needed.  limit one dose daily 15)  Diabetic Shoes  .... Peripheral neuropathy 16)  Lipitor 80 Mg Tabs (Atorvastatin calcium) .[name].. 1 tab by mouth daily  Other Orders: Capillary Blood Glucose/CBG (64403) T-Protime, Auto ([id number])  Patient Instructions: 1)  Follow up with Dr. Delrae Alfred in 4 months  2)  Follow up with Coumadin clinic as per their orders as well.   Orders Added: 1)  Capillary Blood Glucose/CBG [82948] 2)  Gastroenterology Referral [GI] 3)  Est. Patient Level III [75643] 4)  T-Protime, Auto [[id number]] 5)  T-CBC No Diff [[id number]]

## 2010-10-10 NOTE — Progress Notes (Signed)
Summary: GI referral  Phone Note Outgoing Call   Summary of Call: Wayne [name]--GI referral for screen and also for hx of rectal bleeding. Initial call taken by: Julieanne Manson MD,  October 03, 2010 1:04 PM  Follow-up for Phone Call        PT HAS AN APPT 11-13-10 @ 10 AM  [facility]  LEFT A MESSAGE IN HIS ANSWER MACHINE   Follow-up by: Cheryll Dessert,  October 03, 2010 2:38 PM

## 2010-11-13 ENCOUNTER — Encounter: Payer: Self-pay | Admitting: Gastroenterology

## 2010-11-13 ENCOUNTER — Ambulatory Visit (INDEPENDENT_AMBULATORY_CARE_PROVIDER_SITE_OTHER): Payer: Medicaid Other | Admitting: Gastroenterology

## 2010-11-13 VITALS — BP 124/80 | HR 68 | Ht 68.0 in | Wt 198.0 lb

## 2010-11-13 DIAGNOSIS — K921 Melena: Secondary | ICD-10-CM

## 2010-11-13 DIAGNOSIS — K5900 Constipation, unspecified: Secondary | ICD-10-CM

## 2010-11-13 DIAGNOSIS — Z7901 Long term (current) use of anticoagulants: Secondary | ICD-10-CM

## 2010-11-13 DIAGNOSIS — E119 Type 2 diabetes mellitus without complications: Secondary | ICD-10-CM

## 2010-11-13 MED ORDER — PEG-KCL-NACL-NASULF-NA ASC-C 100 G PO SOLR: 1.0000 | Freq: Once | ORAL | Status: AC

## 2010-11-13 NOTE — Patient Instructions (Addendum)
You have been scheduled for a Colonoscopy. Separate instructions given. cc: Julieanne Manson, MD

## 2010-11-13 NOTE — Progress Notes (Signed)
History of Present Illness: This is a 65 year old male referred for evaluation of rectal bleeding. He relates 3 episodes of bright red blood per rectum in the stool and in the commode associated with bowel movements. His first episode was in February and he has had approximately one episode each month. He relates no change in bowel habits or change in stool caliber until the past 2 days when he has noted constipation and has used magnesium citrate. He denies abdominal pain, weight loss, nausea, vomiting, reflux symptoms, dysphagia. He has not previously had colonoscopy or barium enema. He is maintained on warfarin for history of atrial fibrillation since 2004 he has stable COPD, hyperlipidemia, diabetes mellitus, hypertension and chronic ischemic heart disease.  Past Medical History  Diagnosis Date  . Chronic ischemic heart disease   . Atrial fibrillation   . Peripheral vascular disease   . Hypertension   . Diabetes mellitus   . CAD (coronary artery disease)   . Hyperlipidemia   . Depression   . Actinic keratosis   . Degeneration of cervical intervertebral disc   . Osteoarthrosis, unspecified whether generalized or localized, shoulder region   . Other disorder of muscle, ligament, and fascia   . Carpal tunnel syndrome   . COPD (chronic obstructive pulmonary disease)    Past Surgical History  Procedure Date  . Coronary artery bypass graft   . Coronary angioplasty   . Carpal tunnel release     reports that he has been smoking.  He has never used smokeless tobacco. He reports that he does not drink alcohol or use illicit drugs. family history includes Diabetes in his mother; Gout in his brother; and Pneumonia in his father. Allergies  Allergen Reactions  . Codeine    Outpatient Encounter Prescriptions as of 11/13/2010  Medication Sig Dispense Refill  . amLODipine (NORVASC) 5 MG tablet Take 5 mg by mouth daily.        [name] atorvastatin (LIPITOR) 80 MG tablet Take 80 mg by mouth daily.          . DULoxetine (CYMBALTA) 30 MG capsule Take 60 mg by mouth daily.        . fluticasone (FLOVENT HFA) 110 MCG/ACT inhaler Inhale 2 puffs into the lungs 2 (two) times daily.        . furosemide (LASIX) 20 MG tablet Take 20 mg by mouth daily.        [name] gabapentin (NEURONTIN) 300 MG capsule Take 300 mg by mouth 2 (two) times daily.        [name] glimepiride (AMARYL) 2 MG tablet Take 2 mg by mouth 2 (two) times daily.        . GUAIFENESIN 1200 PO Take 1 tablet by mouth 2 (two) times daily.        [name] omega-3 acid ethyl esters (LOVAZA) 1 G capsule Take 1 g by mouth. 4 tablets by mouth daily       . pioglitazone (ACTOS) 15 MG tablet Take 15 mg by mouth daily.        . potassium chloride SA (K-DUR,KLOR-CON) 20 MEQ tablet Take 20 mEq by mouth daily.        . ramipril (ALTACE) 10 MG capsule Take 10 mg by mouth 2 (two) times daily.        [name] tiotropium (SPIRIVA) 18 MCG inhalation capsule Place 18 mcg into inhaler and inhale daily.        [name] warfarin (COUMADIN) 5 MG tablet Take 5 mg by mouth daily.        [name]  peg 3350 powder (MOVIPREP) 100 G SOLR Take 1 kit (100 g total) by mouth once.  1 kit  0  . DISCONTD: sildenafil (VIAGRA) 50 MG tablet Take 50 mg by mouth daily as needed.           Review of Systems: Pertinent positive and negative review of systems were noted in the above HPI section. All other review of systems were otherwise negative.  Physical Exam: General: Well developed , well nourished, no acute distress Head: Normocephalic and atraumatic Eyes:  sclerae anicteric, EOMI Ears: Normal auditory acuity Mouth: No deformity or lesions Neck: Supple, no masses or thyromegaly Lungs: Clear with coarse breath sounds throughout to auscultation Heart: Regular rate and rhythm; no murmurs, rubs or bruits Abdomen: Soft, non tender and non distended. No masses, hepatosplenomegaly or hernias noted. Normal Bowel sounds Rectal: Deferred to colonoscopy Musculoskeletal: Symmetrical with no gross deformities  Skin: No  lesions on visible extremities Pulses:  Normal pulses noted Extremities: No clubbing, cyanosis, edema or deformities noted Neurological: Alert oriented x 4, grossly nonfocal Cervical Nodes:  No significant cervical adenopathy Inguinal Nodes: No significant inguinal adenopathy Psychological:  Alert and cooperative. Normal mood and affect  Assessment and Recommendations:  1. Painless hematochezia. Recurrent episodes. Although he has a history of hemorrhoids, which could be the etiology of his bleeding, colorectal neoplasms, diverticulosis, AVMs and IBD need to be further excluded with colonoscopy. The risks, benefits, and alternatives to colonoscopy with possible biopsy and possible polypectomy were discussed with the patient and they consent to proceed. Due to his comorbidities and warfarin anticoagulation he is at a higher risk of sedation-related and procedure-related complications.  2. Constipation. High fiber diet with increased water intake. MiraLax once or twice daily as needed for management of constipation.  3. Atrial fibrillation with chronic warfarin anticoagulation. The risks, benefits and alternatives to a five-day hold of warfarin were discussed with the patient and he consents to proceed. Obtain clearance from his primary physician.   4. Diabetes mellitus. Management for his procedure by standard protocol.

## 2010-11-24 NOTE — Op Note (Signed)
NAME:  KAZDEN, LARGO NO.:  [id number]   MEDICAL RECORD NO.:  [id number]                   PATIENT TYPE:  INP   LOCATION:  5017                                 FACILITY:  [facility]   PHYSICIAN:  Artist Pais. Mina Marble, M.D.           DATE OF BIRTH:  04-Sep-1945   DATE OF PROCEDURE:  DATE OF DISCHARGE:                                 OPERATIVE REPORT   PREOPERATIVE DIAGNOSIS:  Left hand table saw injury.   POSTOPERATIVE DIAGNOSIS:  Left hand table saw injury.   PROCEDURE:  1. Irrigation and debridement, open reduction internal fixation, open     proximal phalangeal fracture, left index finger.  2. Extensive tendon repair of left index finger.  3. Extensive tendon repair of left long finger and debridement of open     fracture.  4. Complex wound closure.   SURGEON:  Artist Pais. Mina Marble, M.D.   ASSISTANT:  None.   ANESTHESIA:  General.   TOURNIQUET TIME:  One hour.   COMPLICATIONS:  No complications.   DRAINS:  No drains.   OPERATIVE REPORT:  The patient was taken to the operating room after the  induction of adequate general anesthesia. Left upper extremity was prepped  and draped in usual sterile fashion. An Esmarch was used to exsanguinate the  limb. The tourniquet was inflated to 250 mmHg. At this point in time, a  complex oblique laceration across the dorsal aspect of the left hand was  approached. There was an obvious open __________to the distal aspect of the  proximal phalanx left index finger with open fracture with complete  laceration of the extensor mechanism. Also complete extensor mechanism  laceration over the proximal phalanx of the long finger with a loss of  cortical integrity, mid shaft proximal phalanx and complex wounds of the  ring and little finger. The wounds were thoroughly irrigated and debrided of  clot and nonviable material. Once this was done, 035 K wires x2 were driven  from distal to proximal across the fracture site,  through the comminuted and  complex fracture of the proximal phalanx to restore anatomic alignment. The  extensor mechanism was then repaired using 3-0 Mersilene on the index  finger. This was followed by repair of the extensor mechanism over the long  finger and debridement of the open fracture and  complex wound closure of the other two fingers using 5-0 nylon. A sterile  dressing, Xeroform, 4x4s, Fluffs, and a splint with the wrist in slight  extension, the MPs slightly flexed and the PIPs extended was applied. The  patient tolerated the procedure well and came to the recovery room in a  stable fashion.  Artist Pais Mina Marble, M.D.    MAW/MEDQ  D:  11/12/2003  T:  11/13/2003  Job:  [id number]

## 2010-11-24 NOTE — Consult Note (Signed)
NAME:  Wayne [name], GENRICH NO.:  [id number]   MEDICAL RECORD NO.:  [id number]          PATIENT TYPE:  INP   LOCATION:  3707                         FACILITY:  [facility]   PHYSICIAN:  Nadara Mustard, MD     DATE OF BIRTH:  02-07-1946   DATE OF CONSULTATION:  10/24/2006  DATE OF DISCHARGE:                                 CONSULTATION   HISTORY OF PRESENT ILLNESS:  The patient is a 65 year old gentleman who  reports a 2-week history of a toothache-like pain in his left arm  proximal to the elbow radiating up to his neck.  The patient denies any  other systemic symptoms.  He denies any shortness of breath or crushing  chest pain.   PAST MEDICAL HISTORY:  1. CABG in 2004.  2. Most recent cardiac catheterization in 2005.  3. History of ejection fraction of approximately 35%.  4. Type 2 diabetes.   MEDICATIONS:  Glyburide, glipizide, Lasix, Caudet, Zetia, Coumadin,  Tricor, potassium, aspirin and fish oil.   SOCIAL HISTORY:  He is married and smokes half pack a day for over 50  years.   REVIEW OF SYSTEMS:  Unremarkable.   PHYSICAL EXAMINATION:  NECK:  The patient has tenderness over the  thoracic outlet, left neck.  Posterior cervical spine is tender to  palpation.  Axial compression with a rotation of his head reproduces  symptoms.  He has pain relieved with his arm abducted and is on over his  head which relieves his cervical radicular pain.  NEUROLOGIC:  Motor strength was 5/5 in all motor groups of both upper  extremities with no motor weakness.   ASSESSMENT:  Cervical radicular pain, left upper extremity.   RECOMMENDATIONS:  I will follow up as an outpatient in the office.  We  will obtain radiographs and MRI scan to further evaluate his cervical  disk disease.  Okay for discharge from an orthopedic standpoint at this  time.      Nadara Mustard, MD  Electronically Signed     MVD/MEDQ  D:  10/24/2006  T:  10/24/2006  Job:  [id number]

## 2010-11-24 NOTE — H&P (Signed)
NAME:  Wayne [name], Wayne [name] NO.:  [id number]   MEDICAL RECORD NO.:  [id number]                   PATIENT TYPE:  INP   LOCATION:                                       FACILITY:  [facility]   PHYSICIAN:  Cristy Hilts. Jacinto Halim, M.D.                  DATE OF BIRTH:  1946-05-04   DATE OF ADMISSION:  12/03/2002  DATE OF DISCHARGE:                                HISTORY & PHYSICAL   CHIEF COMPLAINT:  Cough, fever and shortness of breath.   HISTORY AND PHYSICAL:  The patient is a 65 year old Caucasian male with  history of new onset of chronic left ventricular systolic heart failure, new  onset of atrial fibrillation on October 10, 2002, who was admitted to [facility].  [facility] and eventually underwent further cardiac evaluation  including cardiac catheterization and was found to have three vessel  coronary artery disease.  He underwent coronary artery bypass grafting on  October 15, 2002, and __________ .  He presents now to the office complaining  of not feeling well with fever, cough and shortness of breath.  He states he  has been miserable.   He denies any chest pain.  He denies any palpitations.   He does complain of significant sputum production which is yellow in color.  He also has significant orthopnea.  He is, in fact, sitting up in a chair  all night.   MEDICATIONS:  1. Coumadin 2.5 mg per protime and INR. His protime and INR have been     subtherapeutic the last three times in the last three weeks.  2. He was suppose to be on amiodarone 200 b.i.d. but he has not been on     amiodarone.  3. Lopressor 50 mg p.o. b.i.d.  4. Accupril 10 mg p.o. daily.  5. Benadryl 25 g daily.  6. Lipitor 80 mg p.o. daily.  7. Aspirin 81 mg p.o. daily.   ALLERGIES:  No known drug allergies.   PAST MEDICAL HISTORY:  This is significant for hypertension, hyperlipidemia,  and chronic atrial fibrillation.   PAST SURGICAL HISTORY:  He has had bypass surgery on October 15, 2002, by Dr.  Kathlee Nations Trigt.   SOCIAL HISTORY:  He is separated.  He lives alone.  He cooks by himself.  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   REVIEW OF SYSTEMS:  He denies any bowel or bladder difficulties.  There are  no neurological weaknesses.  There is no recent weight gain or weight loss.  Other systems are negative.   HABITS:  He smokes about a pack of cigarettes per day.  He quit smoking for  the last four days only.  He has not been drinking alcohol and has been  abstinent from alcohol since about 18 to 20 months ago after he was  developing increasing shortness of  breath.  No history of illicit drug  abuse.   FAMILY HISTORY:  Mother died at the age of 30 with complications of  diabetes.  Father died at the age of 59 with pneumonia.  He has a sibling  who is 38 years of age who has had coronary artery bypass grafting.   PHYSICAL EXAMINATION:  GENERAL APPEARANCE:  He is moderately built, appears  to be in no acute distress.  He is ill looking.  VITAL SIGNS:  He is afebrile.  Heart rate is 140 beats per minute and  irregular, respiratory rate 16, blood pressure 170/110 mmHg.  CHEST:  Right hemithorax, extensive crackles.  Left has scattered crackles.  CARDIOVASCULAR:  S1 is variable, S2 is normal.  No gallop or murmur  appreciated.  ABDOMEN:  Soft, bowel sounds present in all quadrants.  No organomegaly.  EXTREMITIES:  No edema.  PERIPHERAL VASCULAR:  There are 2+ pulses bilaterally.   His EKG shows atrial fibrillation with rapid ventricular response.  There is  nonspecific STT wave changes suggestive of LHV with strain.  No significant  change from prior EKG along with nonprogression of R-wave in the anterior  chest lead.  However, the rate which is uncontrolled is new.   IMPRESSION:  1. Cough, fever.  This appears to be an acute exacerbation of chronic     obstructive pulmonary disease and chronic bronchitis.  2. Atrial fibrillation with rapid  ventricular response.  3. Coumadin coagulopathy with subtherapeutic INR.  4. Hypertension, uncontrolled.  5. Coronary artery disease, status post coronary artery bypass grafting on     October 15, 2002, with left internal mammary artery to left anterior     descending and saphenous vein graft to circumflex, saphenous vein graft     to posterior descending coronary artery.  Chronic left ventricular     systolic dysfunction, ejection fraction 30% by catheterization on     August 13, 2002.  6. Hyperlipidemia.  7. Continued smoking.   RECOMMENDATIONS:  1. Will admit the patient for atrial fibrillation rate control and also     control of hypertension.  2. Will obtain chest x-ray PA and lateral.  3. Will start the patient on antibiotics.  4. Will obtain sputum cultures also.  5. Discharge planning will be initiated.                                               Cristy Hilts. Jacinto Halim, M.D.    Pilar Plate  D:  12/03/2002  T:  12/03/2002  Job:  [id number]

## 2010-11-24 NOTE — Discharge Summary (Signed)
NAME:  Wayne, [name] NO.:  [id number]   MEDICAL RECORD NO.:  [id number]                   PATIENT TYPE:  INP   LOCATION:  2016                                 FACILITY:  [facility]   PHYSICIAN:  Kerin Perna, M.D.               DATE OF BIRTH:  1945/11/18   DATE OF ADMISSION:  10/10/2002  DATE OF DISCHARGE:  10/21/2002                                 DISCHARGE SUMMARY   PRIMARY ADMITTING DIAGNOSIS:  Shortness of breath.   ADDITIONAL/DISCHARGE DIAGNOSES:  1. New onset atrial fibrillation.  2. Congestive heart failure.  3. Hypertension.  4. Probable bronchitis.  5. Coronary artery disease.  6. Hyperlipidemia.  7. Long history of tobacco abuse.   PROCEDURES PERFORMED:  1. Cardiac catheterization.  2. Coronary artery bypass grafting x3 (left internal mammary artery to the     LAD, saphenous vein graft to the OM1, saphenous vein graft to PDA).  3. Endoscopic vein harvest to right thigh with open vein harvest to right     lower leg.   HISTORY OF PRESENT ILLNESS:  The patient is a 65 year old white male who had  not received medical care in at least seven to eight years.  He presented to  the ER at [facility]. [facility] on the date of this admission  complaining of worsening shortness of breath.  This has been going on for  approximately two weeks and has been not only persistent but has worsened in  severity.  On the day of admission, he felt that he was unable to breath at  all and came in for evaluation.  He denied any chest pain, specifically; but  because of his history of untreated hypertension in the past and his  presenting symptoms, an EKG was obtained.  This showed some ST and T-wave  changes as well as atrial fibrillation.  He was also found by examination  and chest x-ray to be in congestive heart failure.  Because of these  reasons, he was admitted for further evaluation and treatment.   HOSPITAL COURSE:  He was started on  antihypertensives as well as diuresis.  He was also started on anticoagulation as well as Cardizem for his atrial  fibrillation.  Cardiology consultation was obtained.  It was felt that in  light of his presenting problems and his risk factors, in addition to his  EKG changes, that he should probably undergo cardiac catheterization to  further delineate his coronary anatomy.  This was performed on October 12, 2002, by Dr. Jacinto Halim.  He was found to have severe three vessel coronary  artery disease with left ventricular dysfunction.  His EF was 25%.  He was  not felt to be a good candidate for percutaneous intervention, therefore  cardiothoracic surgery consultation was obtained.  The patient was seen by  Dr. Donata Clay and it was felt that he should proceed  with surgical  revascularization at this time.   He was taken to the operating room on October 15, 2002, and underwent CABG x3  with the above noted grafts performed by Dr. Donata Clay.  He tolerated the  procedure well and was transferred to the SICU in stable condition.  He was  extubated shortly after his surgery.  He was hemodynamically stable and  doing well on postoperative day #1.  Because of his poor pulmonary status  and his low EF, he was kept in the ICU for further observation.  He  continued to have atrial fibrillation.  His rates remained controlled on  amiodarone and digoxin.  He was continued on heparin and Coumadin was  restarted on postoperative day #2.   He began cardiac rehab phase I while still in the SICU.  He was treated with  aggressive pulmonary toilet measures.  He did require the addition of  bronchodilators to his pulmonary regimen.  He also developed productive  cough with some thick sputum and was started empirically on Ceftin.   By postoperative day #4, he was ready to be transferred to the floor.  Overall, he has done well postoperatively.  He has remained in atrial  fibrillation but his rate has been controlled,  running anywhere between 70  and 80.  He has been weaned off supplemental oxygen and is maintaining O2  saturations of greater than 90% on room air.  His cough and chest congestion  is improving on his current regimen.  He has remained afebrile and all other  vital signs have been stable.   His most recent labs show hemoglobin of 11.4, hematocrit 33.5, white blood  cell count 6.8, platelets 169.  Potassium 4.3, BUN 17, creatinine 0.9.  His  anticoagulation is almost therapeutic, currently with PT of 18.3 and INR of  1.6.   He has been ambulating in the halls without difficulty.  He is tolerating a  regular diet and having normal bowel and bladder function.  It was felt that  if he continues to remain stable and do well, he will be ready for discharge  home once his INR is therapeutic at greater than 2.0, and hopefully this  will be on October 21, 2002.   DISCHARGE MEDICATIONS:  1. Coumadin home dose to be determined by protime and INR on the day of     discharge.  2. Amiodarone 200 mg b.i.d.  3. Coreg 3.125 mg b.i.d.  4. Lanoxin 0.125 mg daily.  5. Ceftin 250 mg b.i.d. x1 week.  6. Humibid LA two tablets b.i.d. x1 week.  7. Tylox one to two q.4h. p.r.n. for pain.   DISCHARGE INSTRUCTIONS:  He is to refrain from driving, heavy lifting, or  strenuous activity.  He may continue daily walking and encouraged to use his  incentive spirometer regularly.  He is asked to shower daily and clean his  incisions with soap and water.  He will continue a low fat, low sodium diet.   FOLLOW UP:  He is asked to make an appointment to see Dr. Jacinto Halim in the next  two weeks.  He will also have a PT and INR drawn on Friday at Dr. Verl Dicker  office for further Coumadin dosing.  CVTS nurse will take out his staples on  Tuesday, October 27, 2002, at 10 a.m.  He will follow up with Dr. Donata Clay on  Friday, Nov 13, 2002, and is asked to have a chest x-ray prior to this appointment at  [facility]  for Dr. Zenaida Niece Trigt's review.  He  should call our office in the interim if he experiences any problems or has  questions.     Coral Ceo, P.A.                        Kerin Perna, M.D.    GC/MEDQ  D:  10/20/2002  T:  10/20/2002  Job:  [id number]   cc:   Dineen Kid. Reche Dixon, M.D.  [address]  [place] [address]  Fax: [phone]   Cristy Hilts. Jacinto Halim, M.D.  [address]  [place]  [place] [address]  Fax: [phone]

## 2010-11-24 NOTE — Cardiovascular Report (Signed)
NAME:  Wayne [name], Wayne [name] NO.:  [id number]   MEDICAL RECORD NO.:  [id number]                   PATIENT TYPE:  INP   LOCATION:  6524                                 FACILITY:  [facility]   PHYSICIAN:  Richard A. Alanda Amass, M.D.          DATE OF BIRTH:  05-11-1946   DATE OF PROCEDURE:  09/29/2003  DATE OF DISCHARGE:                              CARDIAC CATHETERIZATION   PROCEDURE:  Retrograde central aortic catheterization, selective coronary  angiography by Judkins technique, left ventricular angiogram by RAO and LAO  projections, saphenous vein graft angiography, selective left internal  mammary artery, abdominal aortic angiogram, mid stream PA projection, weight-  adjusted heparin, Aggrastat bolus, double bolus plus infusion, Plavix 600 mg  p.o., percutaneous transluminal coronary angioplasty and subsequent  overlapping DDS stents, high grade native right coronary artery distal mid  and proximal disease with diffusely diseased atretic saphenous vein graft-  posterior descending artery.   CARDIOLOGIST:  Richard A. Alanda Amass, M.D.   INDICATIONS:  This 64 year old separated unemployed white father of four and  grandfather, (estranged from children and grandchildren) has known coronary  disease, continues to smoke 1/2 pack a day.  He has been noncompliant with  medications and has had difficulty obtaining medications for financial  reasons.  He has chronic atrial fibrillation, duration unknown.  He was  catheterized in the setting of unstable angina August 14, 2003, by Dr.  Jacinto Halim with high grade three vessel coronary disease.  Ejection fraction 30%  with atrial fibrillation.  He underwent CABG x3 with LIMA to LAD, SVG to  distal OM and SVG to PDA by Dr. Kerin Perna on September 14, 2002.  He was  readmitted with CHF on Nov 11, 2002, and he has been treated medically since  that time.  He has recently been off most medications.  He was seen in the  office September 24, 2003, and started back on medication including chronic  Coumadin.  He was readmitted to the hospital despite restart of medication  on September 29, 2003, with chest pain compatible with ischemia and symptoms of  progressive shortness of breath compatible with recurrent congestive heart  failure.   He is now referred for repeat catheterization in this setting.   Coumadin has been held.  Preoperative renal function is normal ___________,  elevated glucose consistent with AODM, _____________ cholesterol ___________  coronary markers for MI.   DESCRIPTION OF PROCEDURE:  The patient was brought to the second floor CP  Lab in the post absorptive state after 5 mg of Valium p.o. premedication.  He was given incremental Nubain and Versed for sedation in the laboratory.  The CRFA was entered with a single anterior puncture using an 18 thin wall  needle and a 1% Xylocaine anesthesia and a 6 French short side-arm sheath  was inserted. Diagnostic catheterization was done with 6 French 4 cm taper.  Cordis preformed coronary and pigtail catheters  using the Omnipaque dye  throughout the procedure.  LV angiogram was done in the RAO and LAO projections at 25 mL, 14 mL per  second for each projection.  Pullback pressure across the aortic valve  showed no gradient.  The patient was in atrial fibrillation with controlled  ventricular response during the procedure with rates of 70-80.  Saphenous  vein graft angiography was done with the right coronary catheter and  selective LIMA was done with the right coronary catheter by hand injection.  IC nitroglycerin 0.4 mg was given at the left coronary circulation with  repeat injections obtained, the LIMA and the RCA.  The catheter was removed.  The side arm sheath was flushed.  The patient tolerated the diagnostic  procedure well.   RESULTS:  Fluoroscopy showed 2-3+ calcification of the proximal LAD,  circumflex and right coronary artery.   LEFT VENTRICULAR  ANGIOGRAM:  The LV angiogram in the RAO and LAO projection showed global hypokinesis  predominantly, focal wall motion abnormality of hypokinesis of the distal  anterolateral and distal inferior wall.  No definite focal wall motion  abnormalities in the LAO projection, no MR was present and EF was  approximately 35%.   CORONARY ANGIOGRAPHY:  The main left coronary was normal and was only slightly irregular.   The proximal LAD had irregularities, calcification but no high grade  stenosis.  There was 70-75% segmental stenosis beyond a moderate size DX-1  and two large SP branches.  There was 50% of LAD beyond this.  There was a  large DX-2 and beyond that was 90% LAD narrowing, fairly focal in the mid  junction of the proximal-mid LAD.   The remainder of the LAD had no significant stenosis but there was  competitive flow from the LIMA.   The first diagonal had no significant stenosis and was of moderate size.  The second diagonal had 50-70% proximal, 70-80% proximal-mid narrowing.  It  was a moderate size vessel that bifurcated distally.  It was jeopardized by  the LAD stenosis proximal to this.   The circumflex artery had 50% calcific narrowing in the proximal third  before the large PABG branch.  Beyond the PABG branch there was 95% stenosis  with a bifurcating marginal branch.  The inferior bifurcation branch had 85-  90% narrowing.  There was 70% segmental narrowing of the distal marginal  before the vein graft insertion.   The native right coronary had high grade proximal and mid distal disease.  There was 80% narrowing beyond the acute margin, 90% segmental narrowing in  the mid portion, associated 85-90% narrowing proximal in the mid portion, 70-  80% narrowing beyond the ostial and the large RV branch.   The saphenous vein graft to the distal marginal was widely patent with  excellent flow filling the distal marginal branches along with retrograde  filling of the circumflex  proper.   The left internal mammary artery was widely patent with an excellent  anastomosis to the mid LAD filling this antegrade and retrograde filling of  the DX-2 on hand injection.   Saphenous vein graft to the RCA was atretic and diffusely diseased  throughout its course.  There was a good anastomosis to the PDA, and there  was 70% narrowing of the PDA proximal to this.   DISCUSSION:  The patient has chronic atrial fibrillation, well controlled  rate and recurrent angina and CHF, recently off medications.  He has an  atretic graft to the distal right and  high grade diffuse native coronary  disease.  There were no focal lesions to address his right coronary artery  and he was a potential candidate for intervention of his RCA although there  was diffuse proximal mid and distal disease, this appeared to be the best  alternative for this gentleman.  He has a patent SVG to the distal  circumflex and a patent LIMA to the LAD.  There was jeopardized DX-2 but not  critical with native proximal RCA disease.   It was elected to proceed with PCI in this setting recognizing the  limitations.  We did not feel he was a reoperative candidate.   The patient was given double bolus Aggrastat and 300 mg of Plavix x2 and  Aggrastat infusion plus weight-adjusted heparin of 4800 units monitoring the  ACTs.  The right coronary was intubated with a JR-4 6 [place] guiding  catheter and the lesions were crossed with a 0.014 inch Asahi soft wire.  A  3.0/20 Maverick was used to dilate the distal and mid segmental lesions at 5-  20, 88-35 and 7-34.  There was focal dissection beyond the acute margin  after simple balloon inflation and significant elastic recoil so it was  necessary to do tandem stenting of the distal and mid RCA.  This was done  with a 3.0/33 Cypher that was deployed at 10-30, post dilated at 12-40.  An  overlapping 3.0/33 DES Cordis Cypher stent was positioned to cover the mid  lesion at  16-38 and 16-39.  The overlaps were dilated as well.   We then addressed the proximal RCA right after the large RV branch and it  was felt that this was 70% and with some haziness and should be dilated.  This was covered with a 3.0/13 Cypher stent positioned just beyond the RV  branch and deployed at 15-20 and it required 19 atmospheres to open the  stent completely. Following this there was dissection evident beyond the  stent up to the previously placed stents in the RCA which needed to be  covered.  This was done with another 3.0/33 Cypher stent which was deployed  overlapping the mid stent 14-30 and post dilated 16/34.   The ostia of the RCA was not stented and there was a small area between the  proximal and mid stents that were unstented where there was no significant  residual disease.  Finally injection showed excellent flow and good result  with good flow to the PLA, PDA, and good retrograde filling of the atretic graft.  The patient's final ACT was 222 seconds.   We intend to continue medical therapy, continue aspirin and Plavix for at  least one month or longer and then consider putting him on Coumadin and  Plavix after at least one month.  Recognizing the limitations of this  therapy and increased need for bleeding, he will need to be followed  carefully.  We will also need to get social service involved, smoking  cessation, vigorous lipid lowering therapy, are all necessary.   CATHETERIZATION DIAGNOSES:  1. Atherosclerotic heart disease, status post coronary artery bypass graft     x3 October 15, 2002 for significant three vessel coronary disease.  2. Patent left internal mammary artery to left anterior descending, patent     saphenous vein graft to distal circumflex, atretic to saphenous vein     graft to right coronary artery.  3. Left ventricular dysfunction, ejection fraction 35%.  4. Chronic atrial fibrillation.  5. Hyperlipidemia.  6. Cigarette abuse, chronic  obstructive pulmonary disease.  7. History of systemic hypertension.  8. Past alcohol abuse.  9. Adult onset diabetes mellitus.  10.      Successful culprit lesion, extensive native right coronary artery,     DES overlap stenting as outlined above.                                               Richard A. Alanda Amass, M.D.    RAW/MEDQ  D:  10/04/2003  T:  10/05/2003  Job:  [id number]   cc:   Cristy Hilts. Jacinto Halim, M.D.  [address]  [place]  [place] [address]  Fax: [phone]   CP Lab

## 2010-11-24 NOTE — Discharge Summary (Signed)
NAME:  Wayne [name], FLEMISTER NO.:  [id number]   MEDICAL RECORD NO.:  [id number]                    PATIENT TYPE:  INP   LOCATION:                                       FACILITY:  [facility]   PHYSICIAN:  Cristy Hilts. Jacinto Halim, M.D.                  DATE OF BIRTH:  1946-02-21   DATE OF ADMISSION:  12/03/2002  DATE OF DISCHARGE:  12/07/2002                                 DISCHARGE SUMMARY   DISCHARGE DIAGNOSES:  1. Chronic obstructive pulmonary disease exacerbation.  2. Atrial fibrillation, amiodarone discontinued this admission.  3. Moderate to severe left ventricular dysfunction.  4. Coronary disease.  History of coronary artery bypass grafting, April,     2004.  5. Hypertension.  6. Hyperlipidemia.   HOSPITAL COURSE:  The patient is a 65 year old male, followed by Cristy Hilts.  Jacinto Halim, M.D. with LV dysfunction and atrial fibrillation and coronary  disease.  He had catheterization in April this year and underwent bypass  surgery, April 8th.  He presented Dec 03, 2002 with cough and shortness of  breath.  He had apparently been discharged on amiodarone but had not taking  it.  He was admitted by Dr. Jacinto Halim.  He was in atrial fibrillation with rapid  ventricular response in the emergency room.  He also started inhalers and  antibiotics.  He was also on Coumadin as an outpatient and was  subtherapeutic.  His breathing improved over the next few days.  He did have  persistent atrial fibrillation.  Ultimately, it was decided to leave him off  amiodarone as it was felt this may not be tolerated with his significant  COPD.  Final disposition of this will be referred to Dr. Jacinto Halim.  We  ultimately thought he was stable for discharge on Dec 07, 2002.   DISCHARGE MEDICATIONS:  1. Lipitor 80 mg a day.  2. Lanoxin 0.25 daily.  3. Accupril 20 mg a day.  4. Levaquin 500 mg for seven days and then discontinue.  5. Lasix 40 mg a day.  6. Cardizem CD 180 daily.  7. Coumadin 2.5 mg a  day.   LABORATORY DATA:  Chest x-ray showed cardiomegaly with mild chronic  interstitial changes.  White count 6.8, hemoglobin 12.5, hematocrit 37.1,  platelets 147.  INR on admission was 1.8.  INR at discharge 2.5.  Sodium  137, potassium 4.0, BUN 12, creatinine 0.8.  Liver functions were normal.  BNP 242, digoxin level 0.5.  Urinalysis, unremarkable.    DISPOSITION:  The patient is discharged in stable condition and will  followup with Dr. Jacinto Halim in a few weeks.  He will have Coumadin level  checked, Friday, December 11, 2002.       Abelino Derrick, P.A.  Cristy Hilts. Jacinto Halim, M.D.    Lenard Lance  D:  12/29/2002  T:  12/30/2002  Job:  [id number]   cc:   Cristy Hilts. Jacinto Halim, M.D.  [address]  [place]  [place] [address]  Fax: [phone]    cc:   Cristy Hilts. Jacinto Halim, M.D.  [address]  [place]  [place] [address]  Fax: [phone]

## 2010-11-24 NOTE — Consult Note (Signed)
NAME:  Wayne [name], Wayne [name] NO.:  [id number]   MEDICAL RECORD NO.:  [id number]                   PATIENT TYPE:  INP   LOCATION:  5017                                 FACILITY:  [facility]   PHYSICIAN:  Artist Pais. Mina Marble, M.D.           DATE OF BIRTH:  [birth year]   DATE OF CONSULTATION:  11/12/2003  DATE OF DISCHARGE:  11/13/2003                                   CONSULTATION   REFERRING PHYSICIAN:  Doug Sou, M.D.   REASON FOR CONSULTATION:  Wayne [name] is a 65 year old right-handed dominant  male who was using a table saw earlier today.  He presents today with open  injuries to his left hand with open fractures, extensive laceration.  This  patient is 65 years old.   ALLERGIES:  He has no known drug allergies.   CURRENT MEDICATIONS:  Plavix, Digitek, aspirin, Lasix, quinapril, Lescol,  metoprolol, Zetia, and Corgard.   PAST SURGICAL HISTORY:  He is status post bypass surgery in the past and  stent placement in the past six months.  He denies any significant past  medical history or surgical history.   FAMILY HISTORY:  Noncontributory.   SOCIAL HISTORY:  He smokes and drinks.   PHYSICAL EXAMINATION:  GENERAL:  He is a well-developed, well-nourished  male, pleasant.  He is alert and oriented x 3.  EXTREMITIES:  Examination of his left hand shows he has obvious open  injuries to the index, long ring, and small finger with an oblique  laceration from the table saw.  He has an open intra-articular fracture of  the proximal phalanx of the index finger and open fracture of the proximal  phalanx of the long finger, what appears to be extensor tendon injuries to  the ring and small.   LABORATORY DATA:  X-rays show comminuted fractures of the left index and  left long proximal phalanges.   IMPRESSION:  A 65 year old male who is status post table saw injury with his  injuries to his nondominant left hand.   RECOMMENDATIONS:  We will take him to the  operating room for immediate  irrigation and debridement with exploration and repair as necessary.                                               Artist Pais Mina Marble, M.D.    MAW/MEDQ  D:  11/12/2003  T:  11/13/2003  Job:  [id number]

## 2010-11-24 NOTE — Op Note (Signed)
NAME:  Wayne [name], Wayne [name] NO.:  [id number]   MEDICAL RECORD NO.:  [id number]          PATIENT TYPE:  AMB   LOCATION:  DSC                          FACILITY:  [facility]   PHYSICIAN:  Mila Homer. Sherlean Foot, M.D. DATE OF BIRTH:  03-02-1946   DATE OF PROCEDURE:  DATE OF DISCHARGE:                                 OPERATIVE REPORT   SURGEON:  Mila Homer. Sherlean Foot, M.D.   ASSISTANT:  None.   ANESTHESIA:  MAC.   PREOPERATIVE DIAGNOSIS:  Right knee osteoarthritis and medial meniscus tear.  J   POSTOPERATIVE DIAGNOSIS:  Right knee osteoarthritis and medial meniscus  tear.   PROCEDURE:  Right knee arthroscopy with removal of bucket-handle tear of the  medial meniscus, chondroplasty in the medial patellofemoral compartments.  Informed consent was obtained.   INDICATIONS FOR PROCEDURE:  The patient is a 65 year old with mechanical  symptoms and evidence of a bucket-handle tear of the medial meniscus.  Informed consent was obtained.   DESCRIPTION OF THE PROCEDURE:  The patient was laid supine under general  anesthesia.  The right leg was prepped and draped in the usual sterile  fashion.  Inferolateral and inferomedial portals were created with a #11  blade, blunt trocar, and cannula.  Diagnostic arthroscopy revealed some  grade 3 and 4 chondromalacia of the patella.  Chondroplasty was done in this  compartment.  I then went into the medial compartment, and he had some grade  2 and 3 changes, which was debrided, which a chondroplasty was performed as  well.  What was most impressive was the very large bucket-handle tear of the  medial meniscus.  This was amputated anterior and posterior as it was  through the white-white zone, and this was debrided with straight basket  forceps and great white shaver.  I then went into the lateral compartment.  It was pristine.  I then took one further tour to make sure that the  meniscus had been debrided back to stable rim of tissue and that there  was  no loose debris in the joint.  I then closed with 4-0 nylon sutures, dressed  with Xeroform dressing, sponge, sterile Webril and Ace wrap.   COMPLICATIONS:  None.   DRAINS:  None.           ______________________________  Mila Homer. Sherlean Foot, M.D.     SDL/MEDQ  D:  01/16/2006  T:  01/16/2006  Job:  [id number]

## 2010-11-24 NOTE — Discharge Summary (Signed)
NAME:  Wayne [name], LIVERS NO.:  [id number]   MEDICAL RECORD NO.:  [id number]                   PATIENT TYPE:  INP   LOCATION:  6524                                 FACILITY:  [facility]   PHYSICIAN:  Cristy Hilts. Jacinto Halim, M.D.                  DATE OF BIRTH:  1946/06/21   DATE OF ADMISSION:  09/29/2003  DATE OF DISCHARGE:  10/06/2003                                 DISCHARGE SUMMARY   HISTORY OF PRESENT ILLNESS:  Mr. Wayne [name] is a 65 year old white male  with a prior history of coronary artery bypass grafting on October 11, 2002,  with an EF of 20-30%.  He also has chronic atrial fibrillation on Coumadin.  He apparently because of financial issues stopped his medicines.  He then  had increased shortness of breath, chest pain, and back pain with exertion  for three days prior to hospitalization.  He was seen by Nanetta Batty,  M.D., who decided to admit him for IV diuresis, for further cardiac  evaluation, and to get him back on his medications, such as Coumadin.  His  pro time was therapeutic when he came in.   HOSPITAL COURSE:  On September 30, 2003, he had EKG changes with inferolateral  ischemia not seen on his admission EKG.  It was decided that he should  undergo cardiac catheterization.  His Coumadin was held.  His Lopressor was  increased.  He was able to undergo cardiac catheterization on October 04, 2003, which revealed him LIMA to his LAD was patent.  He did have a 40%  right ICA stenosis.  He had an __________ SVG to his RCA.  His SVG to his OM  was patent.  He had multiple blockages in his native RCA and he had a  dissection.  Thus, he underwent stenting with Cypher stents 3.0 x 33, a 3.0  x 13, a 3.0 x 33, and a 3.0 x 33 to his RCA.  He was seen by case manager  for referral for financial assistance or help with getting his medications.  He did have asymptomatic 2.70 and 3.20 second pauses on October 05, 2003.  He  also had some NSVT on October 04, 2003.  His EF  was 40-45% by cardiac  catheterization.  He did have some global hypokinesis and continued to be in  atrial fibrillation.  He was seen by cardiac rehabilitation.  The case  manager was seen.  They thought that [facility] would be his best followup  for his health concerns.  The diabetes coordinator also saw him.  He was  seen by Dr. Jacinto Halim on October 06, 2003.  His blood pressure was 144/88, his  heart rate was 64, and his temperature was 97 degrees.  It was decided that  he was okay to be discharged home.   LABORATORIES:  His hemoglobin was 16.5, hematocrit 48.6, WBC 8.5,  and  platelets 152.  His INR was 1.2 at the time of discharge.  His sodium was  137, potassium 4.3, glucose 108, and creatinine 0.9.  Hemoglobin A1C was  8.8.  CK-MBs were all negative.  His BNP on admission was 888 and then on  September 30, 2003, it was 514.  The total cholesterol was 220, his HDL was 24,  and his LDL was 176.  Triglycerides were 99.  The digoxin level was 0.4.  The TSH was 0.498.  He did undergo Cardiolite test on September 29, 2003, which  showed an EF of 25-30%.  He had LAE and right ventricular dilatation.  The  right atrium was also mildly dilated.  His EKG showed mild CHF.   DISCHARGE MEDICATIONS:  1. Metoprolol 50 mg twice per day.  2. Digoxin 0.25 mg once per day.  3. Lesol XL 80 mg every day.  4. Aspirin 81 mg every day.  5. Accupril 20 mg twice per day.  6. Plavix 75 mg once a day.  7. He is not to stop his Lasix 40 mg once a day.  8. K-Dur 20 mEq once per day while on Lasix.   ACTIVITY:  He should do no strenuous activity.  No lifting or straining.   DIET:  He should be on a low-salt, low-saturated fat diet.   FOLLOWUP:  He will follow up with [facility].  He will follow up with Dr.  Jacinto Halim on October 22, 2003, at 2:30.   DISCHARGE DIAGNOSES:  1. 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  2. Early graft failure, status post  cardiac catheterization this admission     with __________ saphenous vein graft to right coronary artery.  Native     right coronary artery with multiple high-grade blockages, status post     multiple stents to his native right coronary artery as described above.  3. Ejection fraction of 40-45% by cardiac catheterization.  It was 25% by 2-     D echocardiogram.  4. Dyslipidemia.  5. Chronic atrial fibrillation.  Previously on Coumadin.  Now with Cypher     stents.  Needing Plavix for six months.  6. Financial problems.  Concerns with ability to pay for medications.  He     has been referred to [facility] at the time of this dictation for     followup and to get his medications cheaper.  Forms were also filled out     for drug indigent program.  He had discussions with case manager about     Medicaid.  7. Diabetes mellitus.  He was seen by the diabetic coordinator.  At this     time, he was sent home without any diabetes medications.  He will follow     up with [facility] and medications will be recommended per his     outpatient Accu-Cheks.      Lezlie Octave, N.P.                        Cristy Hilts. Jacinto Halim, M.D.    BB/MEDQ  D:  11/10/2003  T:  11/11/2003  Job:  [id number]   cc:   Dala Dock

## 2010-11-24 NOTE — Op Note (Signed)
NAME:  DANE, KOPKE NO.:  [id number]   MEDICAL RECORD NO.:  [id number]                   PATIENT TYPE:  INP   LOCATION:  2301                                 FACILITY:  [facility]   PHYSICIAN:  Kerin Perna III, M.D.           DATE OF BIRTH:  07-27-1945   DATE OF PROCEDURE:  10/15/2002  DATE OF DISCHARGE:                                 OPERATIVE REPORT   OPERATION:  Coronary artery bypass grafting x3 (left internal mammary artery  to LAD, saphenous vein graft to circumflex marginal, saphenous vein graft to  posterior descending).   PREOPERATIVE DIAGNOSES:  Class 4 congestive heart failure, class 4 unstable  angina, positive non-Q-wave myocardial infarction, severe three vessel  coronary disease with poor left ventricular function.   POSTOPERATIVE DIAGNOSES:  Class 4 congestive heart failure, class 4 unstable  angina, positive non-Q-wave myocardial infarction,  severe three vessel  coronary disease with poor left ventricular function.   SURGEON:  Kerin Perna, M.D.   ASSISTANT:  Gwenith Daily. Tyrone Sage, M.D. and Toribio Harbour, N.P.   ANESTHESIA:  General by Dr. Krista Blue.   INDICATIONS FOR PROCEDURE:  The patient is a 65 year old white male smoker  who presented with acute onset of CHF and pulmonary edema. Cardiac enzymes  were positive. A 2-D echo demonstrated EF of 20% and he underwent cardiac  catheterization which demonstrated severe three vessel disease. He was felt  to be a candidate for surgical coronary revascularization. Prior to surgery,  I examined the patient in his hospital room and reviewed the results of the  cardiac catheterization with the patient. I discussed the indications and  expected benefits of coronary bypass surgery for treatment of his coronary  artery disease as well as the alternatives to surgical therapy. I reviewed  with the patient the major aspects of the procedure including the location  of the surgical  incisions, the choice of conduit for grafting, the use of  general anesthesia and cardiopulmonary bypass and the expected postoperative  hospital recovery. I discussed with the patient the risks to him of coronary  bypass surgery including risks of MI, CVA, bleeding, infection and death. He  understood these implications for the surgery and agreed to proceed with the  operation as planned under what I felt was an informed consent.   OPERATIVE FINDINGS:  The left ventricle was dilated and scarred. The  coronaries were heavily disease. The mammary artery was slightly sclerotic  but an adequate conduit. The vein was taken from the right thigh and the  right lower leg.   Following surgery after coming off bypass, the patient had a coagulopathy  and was transfused with a unit of platelets and FFP which improved his  coagulation function. He had been Coumadin preoperatively when he presented  in atrial fibrillation. After cardioversion, he maintained a sinus rhythm.   DESCRIPTION OF PROCEDURE:  The patient was brought  to the operating room and  placed supine on the operating room table where general anesthesia was  induced on invasive hemodynamic monitoring. The chest, abdomen and legs were  prepped with Betadine and draped as a sterile field. A sternal incision was  made, the saphenous vein was harvested from the right leg. The left  inframammary artery was harvested at a pedicle graft from its origin at the  subclavian vessels. Heparin was administered and the ACT was documented as  being therapeutic. The sternal retractor was placed. The pursestring was  placed in the ascending aorta and right atrium. The patient was cannulated  and placed on bypass and cooled to 32 degrees. The coronaries were  identified for grafting and the mammary artery and vein grafts were prepared  for the distal anastomoses. A cardioplegia cannula was placed for both  antegrade and retrograde delivery of  cardioplegia. The patient was cooled to  28 degrees and an aortic cross clamp was applied.  A total of 600 mL of  split doses between the antegrade aortic and retrograde coronary sinus  catheters was delivered. There was good cardioplegic arrest with septal  temperature down to less than 14 degrees. Topical ice saline slush was used  to augment myocardial preservation and a pericardial insulator pad was used  to protect the left phrenic nerve.   The distal coronary anastomosis was then performed. The first distal  anastomosis was the posterior descending. There was a 1.7 mm vessel 90%  stenosis and the saphenous vein was sewn end-to-side with a running 7-0  Prolene. The second distal anastomosis was at the circumflex marginal. This  was a 1.5 mm vessel, proximal 95% stenosis. The saphenous vein was sewn end-  to-side with a running 7-0 Prolene with a good flow through the graft. The  third distal anastomosis was at the distal third of the LAD. This was a 1.5  mm vessel which was intramyocardial more proximally. The left inframammary  artery was brought down and sewn end-to-side with running 8-0 Prolene and  there was a good flow through the anastomosis with immediate rise in septal  temperature after release of the pedicle clamp on the mammary artery. The  mammary pedicle was secured to the epicardium and aortic cross clamp was  removed.   The heart was cardioverted back to a regular rhythm. Two proximal vein  anastomoses were placed on the ascending aorta which was somewhat thickened  and friable. The partial clamp was removed and vein grafts were perfused. He  has had good flow and hemostasis was documented at the proximal and distal  anastomoses. The patient was rewarmed to 37 degrees. Temporary pacing wires  were applied. The lungs were reexpanded and ventilator was resumed. The  patient was weaned from bypass on renal dose dopamine and low dose milrinone with good hemodynamics and  cardiac output. Protamine was administered  without adverse reaction. The cannula was removed. The mediastinum was  irrigated with warm antibiotic irrigation. The leg incision was irrigated  and closed in a standard fashion. The pericardium was loosely  reapproximated. Two mediastinal and a left pleural chest tube were placed  and brought out through separate incisions. The sternum was reapproximated  with interrupted wire. The pectoralis fascia was closed with interrupted  Vicryl. The subcutaneous skin were closed with a running Vicryl. Total  bypass time was 120 minutes with aortic cross clamp time of 45 minutes.  Mikey Bussing, M.D.    PV/MEDQ  D:  10/15/2002  T:  10/17/2002  Job:  [id number]   cc:   Cristy Hilts. Jacinto Halim, M.D.  [address]  [place]  [place] [address]  Fax: [phone]   CVTS Office

## 2010-11-24 NOTE — Cardiovascular Report (Signed)
NAME:  Wayne [name], Wayne [name] NO.:  [id number]   MEDICAL RECORD NO.:  [id number]                   PATIENT TYPE:  INP   LOCATION:  4729                                 FACILITY:  [facility]   PHYSICIAN:  Cristy Hilts. Jacinto Halim, M.D.                  DATE OF BIRTH:  05/21/1946   DATE OF PROCEDURE:  10/12/2002  DATE OF DISCHARGE:                              CARDIAC CATHETERIZATION   PROCEDURE PERFORMED:  1. Left ventriculography.  2. Selective right and left coronary arteriography.  3. Abdominal aortogram.  4. Selective right innominate and left subclavian arteriography including     visualization of  left internal mammary artery right internal mammary     artery.   INDICATIONS FOR PROCEDURE:  The patient is a 65 year old gentleman with no  previous significant cardiac history who was admitted to the hospital, who  was found to have hyperlipidemia, history of alcohol abuse in the past, was  admitted with atrial fibrillation with rapid ventricular response. Due to  his multiple cardiac risk factors, he was brought directly to the cardiac  catheterization lab to evaluate his coronary anatomy.  A high suspicion for  coronary artery disease was suspected.   HEMODYNAMIC DATA:  The left ventricular pressure 127/17 with end diastolic  pressure of 24 mmHg  The aortic pressure was 126/88 with a mean of 104 mmHg.  There was no pressure gradient across the aortic valve.   ANGIOGRAPHIC DATA:  Left ventricle:  The left ventricular systolic function  was markedly depressed with global hypokinesis.  The left ventricle is  mildly dilated.  Ejection fraction is estimated at 30%.   Right coronary artery:  The right coronary artery is a large-caliber vessel.  In the proximal segment, it gives origin to a large RV branch.  After the RV  branch, there is an 80% long-segment stenosis.  There is mild luminal  irregularity in the mid segment.   Left ventricle:  The left ventricle is a  large caliber vessel.  It is  normal.   Circumflex artery:  The circumflex artery is codominant with the right  coronary artery.  The mid proximal to mid segment has about 30% stenosis.  It gives origin to a PDA branch, and after the PDA branch, the circumflex  continues as a large obtuse marginal 1.  This obtuse marginal 1 has ostial  80% and tandem 80 and 70% long segment stenosis.   Ramus intermedius:  The ramus intermedius is a moderate-caliber vessel.  It  is normal.   Left anterior descending artery:  The left anterior descending artery is a  large-caliber vessel.  It gives origin to a small  diagonal 1 and a moderate-  size diagonal 2.  After the origin of diagonal 1, there is a 30% stenosis in  the mid LAD.  Right internal mammary artery and  left internal mammary  artery  including subclavian is widely patent.   IMPRESSION:  Cardiomyopathy, etiology ischemic versus alcohol abuse in the  past.  Would give benefit of doubt.  Probably, I would suspect that the  cardiomyopathy is secondary to three-vessel coronary artery disease.   RECOMMENDATIONS:  We will proceed with CVTS consultation for coronary artery  bypass grafting given his three-vessel coronary disease and left ventricular  dysfunction.   TECHNIQUES OF PROCEDURE:  Under usual sterile precautions, using 6-French  right femoral access, a 6-French multipurpose B2 catheter was advanced into  the ascending aorta over a 0.035 J wire.  The catheter was then advanced  into the left ventricle.  Left ventricular pressures were monitored.  Hand  contrast injection of the left ventricle was performed both in the LAO and  RAO projections.  The catheter was flushed with saline and pulled back into  the ascending aorta.  The _______ was monitored.  The right coronary artery  was selectively engaged, and angiography was performed.  Then the catheter  was manipulated to engage the left main coronary artery and angiography was   performed.  Then the catheter was manipulated to engage the right innominate  and left subclavian artery, and angiography was performed.  Abdominal  aortogram was also performed.  Then the catheter was pulled out of the body.  A 6-French diagnostic Judkins left catheter was re-advanced into the  ascending aorta, and the left main coronary artery was selectively engaged.  Angiography was performed.  In the right coronary artery, intracoronary  nitroglycerin was also administered to evaluate for coronary spasm, and 200  micrograms were administered.   After obtaining adequate views of the left coronary system, the catheter was  pulled out of the body in the usual fashion.  The patient tolerated the  procedure well.                                               Cristy Hilts. Jacinto Halim, M.D.    Pilar Plate  D:  10/12/2002  T:  10/14/2002  Job:  [id number]   cc:   Dineen Kid. Reche Dixon, M.D.  [address]  [place] [address]  Fax: [phone]   [facility]   [facility]

## 2010-11-24 NOTE — Discharge Summary (Signed)
NAME:  Wayne [name], Wayne [name] NO.:  [id number]   MEDICAL RECORD NO.:  [id number]          PATIENT TYPE:  INP   LOCATION:  3707                         FACILITY:  [facility]   PHYSICIAN:  Cristy Hilts. Jacinto Halim, MD       DATE OF BIRTH:  10/02/1945   DATE OF ADMISSION:  10/23/2006  DATE OF DISCHARGE:  10/25/2006                               DISCHARGE SUMMARY   DISCHARGE DIAGNOSES:  1. Left arm pain felt to be musculoskeletal.  2. Coronary disease, coronary artery bypass grafting in 2004 with      stenting to the native RCA in March 2005.  3. Cardiomyopathy with an EF of 35% currently compensated.  4. Atrial fibrillation.  The patient did not tolerate the addition of      beta blocker because of bradycardia.  5. History of smoking.  6. Non-insulin-dependent diabetes.  7. Treated dyslipidemia.  8. Treated hypertension.  9. Coumadin therapy.   HOSPITAL COURSE:  The patient is a 64 year old male followed by Dr.  Jacinto Halim and [facility] with history of coronary disease.  He had bypass  in 2004 and then in March 2005 had a total SVG to the PDA and underwent  native RCA stenting.  The vein graft to the OM and the LIMA to the LAD  were patent.  His EF was 35%.  He presented to the emergency room October 23, 2006 with left arm pain which he had been having for a couple days.  He got no relief from nitroglycerin.  His enzymes were negative x3.  He  was admitted for 24-hour observation.  He was seen in consult by Dr.  Aldean Baker.  Please see his dictated consult note for complete details.  We feel his pain is most likely a radiculopathy.  Dr. Lajoyce Corners will follow  him up as an outpatient.  We did try and add a beta-blocker to his  current medications, but he had bradycardia and pauses.  This was  discontinued at discharge.  The patient will follow up with Dr. Jacinto Halim in  a few weeks.   DISCHARGE MEDICATIONS:  1. Coated aspirin once a day 81 mg.  2. Caduet 540 daily.  3. Zetia 10 mg a day.  4.  Amaryl 2 mg a day.  5. Tricor 145 daily.  6. Altace 10 mg twice a day.  7. Neurontin 300 mg twice a day.  8. Furosemide 20 mg a day.  9. Potassium 20 mEq a day.  10.Coumadin 5 mg a day as taken at home.  11.Darvocet-N 100.   LABORATORY DATA:  CK-MB and troponins were negative x3.  White count  7.4, hemoglobin 15, hematocrit 44.1, platelets 150.  Sodium 135,  potassium 3.7, BUN 8, creatinine 0.79.  LFTs are normal. Hemoglobin A1c  is 7.1.  BNP is 224.  PSA 0.3.  TSH 1.61.   Chest x-ray shows cardiomegaly and some congestion.  No acute findings.  EKG shows atrial fibrillation, controlled ventricular response.   DISPOSITION:  The patient discharged in stable condition.  He will  follow up with Dr. Jacinto Halim and  Dr. Lajoyce Corners.  It should be noted he did not  tolerate the addition of beta blocker during this admission.      Abelino Derrick, P.A.      Cristy Hilts. Jacinto Halim, MD  Electronically Signed    LKK/MEDQ  D:  10/25/2006  T:  10/25/2006  Job:  [id number]   cc:   Dorothey Baseman, M.D.  Cristy Hilts. Jacinto Halim, MD  Nadara Mustard, MD

## 2010-11-28 ENCOUNTER — Ambulatory Visit (AMBULATORY_SURGERY_CENTER): Payer: Medicaid Other | Admitting: Gastroenterology

## 2010-11-28 ENCOUNTER — Other Ambulatory Visit: Payer: Medicaid Other | Admitting: Gastroenterology

## 2010-11-28 ENCOUNTER — Encounter: Payer: Self-pay | Admitting: Gastroenterology

## 2010-11-28 DIAGNOSIS — K649 Unspecified hemorrhoids: Secondary | ICD-10-CM

## 2010-11-28 DIAGNOSIS — K921 Melena: Secondary | ICD-10-CM

## 2010-11-28 DIAGNOSIS — D126 Benign neoplasm of colon, unspecified: Secondary | ICD-10-CM

## 2010-11-28 MED ORDER — SODIUM CHLORIDE 0.9 % IV SOLN: 500.0000 mL | INTRAVENOUS | Status: DC

## 2010-11-29 ENCOUNTER — Telehealth: Payer: Self-pay

## 2010-11-29 NOTE — Telephone Encounter (Signed)

## 2010-12-06 ENCOUNTER — Encounter: Payer: Self-pay | Admitting: Gastroenterology

## 2011-06-26 ENCOUNTER — Other Ambulatory Visit: Payer: Self-pay | Admitting: Internal Medicine

## 2011-06-26 DIAGNOSIS — R413 Other amnesia: Secondary | ICD-10-CM

## 2011-06-26 DIAGNOSIS — R51 Headache: Secondary | ICD-10-CM

## 2011-06-29 ENCOUNTER — Ambulatory Visit (HOSPITAL_COMMUNITY)
Admission: RE | Admit: 2011-06-29 | Discharge: 2011-06-29 | Disposition: A | Discharge status: Home / Self Care | Payer: Medicare Other | Source: Ambulatory Visit | Attending: Internal Medicine | Admitting: Internal Medicine

## 2011-06-29 DIAGNOSIS — R51 Headache: Secondary | ICD-10-CM | POA: Insufficient documentation

## 2011-06-29 DIAGNOSIS — R413 Other amnesia: Secondary | ICD-10-CM | POA: Insufficient documentation

## 2011-06-29 DIAGNOSIS — H538 Other visual disturbances: Secondary | ICD-10-CM | POA: Insufficient documentation

## 2012-04-24 ENCOUNTER — Emergency Department (INDEPENDENT_AMBULATORY_CARE_PROVIDER_SITE_OTHER)
Admission: EM | Admit: 2012-04-24 | Discharge: 2012-04-24 | Disposition: A | Discharge status: Home / Self Care | Payer: Medicare Other | Source: Home / Self Care | Attending: Emergency Medicine | Admitting: Emergency Medicine

## 2012-04-24 ENCOUNTER — Emergency Department (INDEPENDENT_AMBULATORY_CARE_PROVIDER_SITE_OTHER): Payer: Medicare Other

## 2012-04-24 ENCOUNTER — Encounter (HOSPITAL_COMMUNITY): Payer: Self-pay | Admitting: Emergency Medicine

## 2012-04-24 DIAGNOSIS — M25519 Pain in unspecified shoulder: Secondary | ICD-10-CM

## 2012-04-24 DIAGNOSIS — M25511 Pain in right shoulder: Secondary | ICD-10-CM

## 2012-04-24 MED ORDER — HYDROCODONE-ACETAMINOPHEN 5-500 MG PO TABS: 1.0000 | ORAL_TABLET | Freq: Four times a day (QID) | ORAL | Status: DC | PRN

## 2012-04-24 NOTE — ED Notes (Addendum)
Reports falling from a ladder 3 weeks ago.  Did not see a physician after the accident.  Reports he was approx 20 feet high on ladder when he fell , landing on grass.  No loc.  Also reports ladder fell on top of him.  Reports right shoulder pain, now pain has spread up to left neck and down left arm.  Denies numbness or tingling in right arm.  Reports shoulder feels better if he lies on shoulder, puts pressure on shoulder.  Patient reports he did not pass out, he just lost his footing as to the reason he fell off ladder.

## 2012-04-24 NOTE — ED Notes (Signed)
Delay in discharge secondary to department acuity 

## 2012-04-24 NOTE — ED Provider Notes (Signed)
History     CSN: [id number]  Arrival date & time 04/24/12  1112   First MD Initiated Contact with Patient 04/24/12 1250      Chief Complaint  Patient presents with  . Shoulder Pain    (Consider location/radiation/quality/duration/timing/severity/associated sxs/prior treatment) Patient is a 66 y.o. male presenting with shoulder pain. The history is provided by the patient.  Shoulder Pain  Patient reports he fell off ladder 3 weeks ago injuring right shoulder.  Immediate pain, able to move hand/arm after incident.  Reports over the past weeks pain has gotten increasingly worse.  Pain is sharp and intermittent in nature, worse with movement, no medications taken at home.  No change in hand or arm function.  Denies past injury to right shoulder, pt is diabetic. Past Medical History  Diagnosis Date  . Chronic ischemic heart disease   . Atrial fibrillation   . Peripheral vascular disease   . Hypertension   . Diabetes mellitus   . CAD (coronary artery disease)   . Hyperlipidemia   . Depression   . Actinic keratosis   . Degeneration of cervical intervertebral disc   . Osteoarthrosis, unspecified whether generalized or localized, shoulder region   . Other disorder of muscle, ligament, and fascia   . Carpal tunnel syndrome   . COPD (chronic obstructive pulmonary disease)     Past Surgical History  Procedure Date  . Coronary artery bypass graft   . Coronary angioplasty   . Carpal tunnel release     Family History  Problem Relation Age of Onset  . Diabetes Mother   . Pneumonia Father   . Gout Brother     History  Substance Use Topics  . Smoking status: Current Every Day Smoker  . Smokeless tobacco: Never Used  . Alcohol Use: No      Review of Systems  Constitutional: Negative.   Respiratory: Negative.   Cardiovascular: Negative.   Musculoskeletal: Positive for arthralgias.  Neurological: Negative.     Allergies  Codeine  Home Medications   Current  Outpatient Rx  Name Route Sig Dispense Refill  . AMLODIPINE BESYLATE 5 MG PO TABS Oral Take 5 mg by mouth daily.      . ATORVASTATIN CALCIUM 80 MG PO TABS Oral Take 80 mg by mouth daily.      . DULOXETINE HCL 30 MG PO CPEP Oral Take 60 mg by mouth daily.      [name] FLUTICASONE PROPIONATE  HFA 110 MCG/ACT IN AERO Inhalation Inhale 2 puffs into the lungs 2 (two) times daily.      . FUROSEMIDE 20 MG PO TABS Oral Take 20 mg by mouth daily.      [name] GABAPENTIN 300 MG PO CAPS Oral Take 300 mg by mouth 2 (two) times daily.      [name] GLIMEPIRIDE 2 MG PO TABS Oral Take 2 mg by mouth 2 (two) times daily.      . GUAIFENESIN 1200 PO Oral Take 1 tablet by mouth 2 (two) times daily.      [name] HYDROCODONE-ACETAMINOPHEN 5-500 MG PO TABS Oral Take 1-2 tablets by mouth every 6 (six) hours as needed for pain. 15 tablet 0  . OMEGA-3-ACID ETHYL ESTERS 1 G PO CAPS Oral Take 1 g by mouth. 4 tablets by mouth daily     . PIOGLITAZONE HCL 15 MG PO TABS Oral Take 15 mg by mouth daily.      [name] POTASSIUM CHLORIDE CRYS ER 20 MEQ PO TBCR Oral Take 20  mEq by mouth daily.      [name] RAMIPRIL 10 MG PO CAPS Oral Take 10 mg by mouth 2 (two) times daily.      [name] TIOTROPIUM BROMIDE MONOHYDRATE 18 MCG IN CAPS Inhalation Place 18 mcg into inhaler and inhale daily.      . WARFARIN SODIUM 5 MG PO TABS Oral Take 5 mg by mouth daily.        BP 165/75  Pulse 64  Temp 98 F (36.7 C) (Oral)  Resp 20  SpO2 99%  Physical Exam  Nursing note and vitals reviewed. Constitutional: He is oriented to person, place, and time. Vital signs are normal. He appears well-developed and well-nourished. He is active and cooperative.  HENT:  Head: Normocephalic.  Eyes: Conjunctivae normal are normal. Pupils are equal, round, and reactive to light. No scleral icterus.  Neck: Trachea normal. Neck supple.  Cardiovascular: Normal rate, regular rhythm and intact distal pulses.   Pulmonary/Chest: Effort normal and breath sounds normal.  Musculoskeletal: Normal range of  motion.       Right shoulder: Normal.       Left shoulder: Normal.       Cervical back: Normal.  Neurological: He is alert and oriented to person, place, and time. He has normal strength. No cranial nerve deficit or sensory deficit. Coordination and gait normal. GCS eye subscore is 4. GCS verbal subscore is 5. GCS motor subscore is 6.  Skin: Skin is warm, dry and intact. No bruising and no ecchymosis noted.  Psychiatric: He has a normal mood and affect. His speech is normal and behavior is normal. Judgment and thought content normal. Cognition and memory are normal.    ED Course  Procedures (including critical care time)  Labs Reviewed - No data to display Dg Shoulder Right  04/24/2012  *RADIOLOGY REPORT*  Clinical Data: Status post fall 2 weeks ago.  Pain.  RIGHT SHOULDER - 2+ VIEW  Comparison: None.  Findings: There is no acute bony or joint abnormality.  The patient has advanced glenohumeral degenerative disease.  Moderate acromioclavicular osteoarthritis is also seen.  Soft tissue structures unremarkable.  IMPRESSION:  1.  No acute finding. 2.  Advanced glenohumeral and moderate acromioclavicular degenerative change.   Original Report Authenticated By: Bernadene Bell. D'ALESSIO, M.D.      1. Shoulder pain, right       MDM  Continue medications as prescribed, vicodin as needed for pain.  Follow up with orthopedist in one week if symptoms are not improved.         Johnsie Kindred, NP 04/24/12 1352

## 2012-04-25 NOTE — ED Provider Notes (Signed)
Medical screening examination/treatment/procedure(s) were performed by non-physician practitioner and as supervising physician I was immediately available for consultation/collaboration.  Osker Ayoub, M.D.   Anvitha Hutmacher C Reice Bienvenue, MD 04/25/12 0134 

## 2012-05-14 ENCOUNTER — Observation Stay (HOSPITAL_COMMUNITY)
Admission: EM | Admit: 2012-05-14 | Discharge: 2012-05-16 | Disposition: A | Discharge status: Home / Self Care | Payer: Medicare Other | Attending: Family Medicine | Admitting: Family Medicine

## 2012-05-14 ENCOUNTER — Emergency Department (HOSPITAL_COMMUNITY): Payer: Medicare Other

## 2012-05-14 ENCOUNTER — Encounter (HOSPITAL_COMMUNITY): Payer: Self-pay

## 2012-05-14 DIAGNOSIS — Z7901 Long term (current) use of anticoagulants: Secondary | ICD-10-CM | POA: Insufficient documentation

## 2012-05-14 DIAGNOSIS — Z9889 Other specified postprocedural states: Secondary | ICD-10-CM

## 2012-05-14 DIAGNOSIS — F3289 Other specified depressive episodes: Secondary | ICD-10-CM

## 2012-05-14 DIAGNOSIS — I739 Peripheral vascular disease, unspecified: Secondary | ICD-10-CM

## 2012-05-14 DIAGNOSIS — R0602 Shortness of breath: Secondary | ICD-10-CM | POA: Insufficient documentation

## 2012-05-14 DIAGNOSIS — R51 Headache: Secondary | ICD-10-CM

## 2012-05-14 DIAGNOSIS — F17200 Nicotine dependence, unspecified, uncomplicated: Secondary | ICD-10-CM

## 2012-05-14 DIAGNOSIS — IMO0001 Reserved for inherently not codable concepts without codable children: Secondary | ICD-10-CM

## 2012-05-14 DIAGNOSIS — Z9861 Coronary angioplasty status: Secondary | ICD-10-CM

## 2012-05-14 DIAGNOSIS — M25519 Pain in unspecified shoulder: Secondary | ICD-10-CM

## 2012-05-14 DIAGNOSIS — E785 Hyperlipidemia, unspecified: Secondary | ICD-10-CM

## 2012-05-14 DIAGNOSIS — R079 Chest pain, unspecified: Principal | ICD-10-CM

## 2012-05-14 DIAGNOSIS — I10 Essential (primary) hypertension: Secondary | ICD-10-CM

## 2012-05-14 DIAGNOSIS — I2589 Other forms of chronic ischemic heart disease: Secondary | ICD-10-CM

## 2012-05-14 DIAGNOSIS — I4891 Unspecified atrial fibrillation: Secondary | ICD-10-CM

## 2012-05-14 DIAGNOSIS — F329 Major depressive disorder, single episode, unspecified: Secondary | ICD-10-CM

## 2012-05-14 DIAGNOSIS — I498 Other specified cardiac arrhythmias: Secondary | ICD-10-CM | POA: Insufficient documentation

## 2012-05-14 DIAGNOSIS — J40 Bronchitis, not specified as acute or chronic: Secondary | ICD-10-CM

## 2012-05-14 DIAGNOSIS — G5600 Carpal tunnel syndrome, unspecified upper limb: Secondary | ICD-10-CM

## 2012-05-14 DIAGNOSIS — J4489 Other specified chronic obstructive pulmonary disease: Secondary | ICD-10-CM

## 2012-05-14 DIAGNOSIS — M5030 Other cervical disc degeneration, unspecified cervical region: Secondary | ICD-10-CM

## 2012-05-14 DIAGNOSIS — L570 Actinic keratosis: Secondary | ICD-10-CM

## 2012-05-14 DIAGNOSIS — L821 Other seborrheic keratosis: Secondary | ICD-10-CM

## 2012-05-14 DIAGNOSIS — R61 Generalized hyperhidrosis: Secondary | ICD-10-CM | POA: Insufficient documentation

## 2012-05-14 DIAGNOSIS — Z79899 Other long term (current) drug therapy: Secondary | ICD-10-CM | POA: Insufficient documentation

## 2012-05-14 DIAGNOSIS — D485 Neoplasm of uncertain behavior of skin: Secondary | ICD-10-CM

## 2012-05-14 DIAGNOSIS — I2510 Atherosclerotic heart disease of native coronary artery without angina pectoris: Secondary | ICD-10-CM

## 2012-05-14 DIAGNOSIS — Z8719 Personal history of other diseases of the digestive system: Secondary | ICD-10-CM

## 2012-05-14 DIAGNOSIS — J449 Chronic obstructive pulmonary disease, unspecified: Secondary | ICD-10-CM

## 2012-05-14 DIAGNOSIS — M19019 Primary osteoarthritis, unspecified shoulder: Secondary | ICD-10-CM

## 2012-05-14 HISTORY — DX: Shortness of breath: R06.02

## 2012-05-14 HISTORY — DX: Acute myocardial infarction, unspecified: I21.9

## 2012-05-14 LAB — CBC
Hemoglobin: 14.4 g/dL (ref 13.0–17.0)
MCH: 32 pg (ref 26.0–34.0)
MCV: 92 fL (ref 78.0–100.0)
Platelets: 136 10*3/uL — ABNORMAL LOW (ref 150–400)
RBC: 4.5 MIL/uL (ref 4.22–5.81)
WBC: 11 10*3/uL — ABNORMAL HIGH (ref 4.0–10.5)

## 2012-05-14 LAB — POCT I-STAT TROPONIN I: Troponin i, poc: 0 ng/mL (ref 0.00–0.08)

## 2012-05-14 LAB — POCT I-STAT, CHEM 8
BUN: 15 mg/dL (ref 6–23)
Calcium, Ion: 1.15 mmol/L (ref 1.13–1.30)
Chloride: 105 mEq/L (ref 96–112)
Creatinine, Ser: 0.9 mg/dL (ref 0.50–1.35)
Sodium: 140 mEq/L (ref 135–145)
TCO2: 22 mmol/L (ref 0–100)

## 2012-05-14 MED ORDER — ASPIRIN 81 MG PO CHEW: 324.0000 mg | CHEWABLE_TABLET | Freq: Once | ORAL | Status: AC

## 2012-05-14 NOTE — ED Provider Notes (Signed)
History     CSN: [id number]  Arrival date & time 05/14/12  2323   First MD Initiated Contact with Patient 05/14/12 2327      Chief Complaint  Patient presents with  . Chest Pain  . Arm Pain    (Consider location/radiation/quality/duration/timing/severity/associated sxs/prior treatment) HPI HX per PT. L arm pain from shoulder to wrist, severe and worse with movement, also has CP that he feels is not related, substernal mild to moderate, no SOB, nausea, or diaphoresis. Has cardiac history and denies h/o similar arm pain symptoms. Called EMS, NTG and ASA without relief of severe arm pain. No rash, or swelling. No numbness or tingling.  Past Medical History  Diagnosis Date  . Chronic ischemic heart disease   . Atrial fibrillation   . Peripheral vascular disease   . Hypertension   . Diabetes mellitus   . CAD (coronary artery disease)   . Hyperlipidemia   . Depression   . Actinic keratosis   . Degeneration of cervical intervertebral disc   . Osteoarthrosis, unspecified whether generalized or localized, shoulder region   . Other disorder of muscle, ligament, and fascia   . Carpal tunnel syndrome   . COPD (chronic obstructive pulmonary disease)     Past Surgical History  Procedure Date  . Coronary artery bypass graft   . Coronary angioplasty   . Carpal tunnel release     Family History  Problem Relation Age of Onset  . Diabetes Mother   . Pneumonia Father   . Gout Brother     History  Substance Use Topics  . Smoking status: Current Every Day Smoker  . Smokeless tobacco: Never Used  . Alcohol Use: No      Review of Systems  Constitutional: Negative for fever and chills.  HENT: Negative for sore throat, neck pain and neck stiffness.   Eyes: Negative for pain.  Respiratory: Negative for shortness of breath.   Cardiovascular: Positive for chest pain.  Gastrointestinal: Negative for nausea, vomiting and abdominal pain.  Genitourinary: Negative for dysuria.    Musculoskeletal: Negative for back pain.  Skin: Negative for rash and wound.  Neurological: Negative for headaches.  All other systems reviewed and are negative.    Allergies  Codeine  Home Medications   Current Outpatient Rx  Name  Route  Sig  Dispense  Refill  . AMLODIPINE BESYLATE 5 MG PO TABS   Oral   Take 5 mg by mouth daily.           . ATORVASTATIN CALCIUM 80 MG PO TABS   Oral   Take 80 mg by mouth daily.           . DULOXETINE HCL 30 MG PO CPEP   Oral   Take 60 mg by mouth daily.           [name] FLUTICASONE PROPIONATE  HFA 110 MCG/ACT IN AERO   Inhalation   Inhale 2 puffs into the lungs 2 (two) times daily.           . FUROSEMIDE 20 MG PO TABS   Oral   Take 20 mg by mouth daily.           [name] GABAPENTIN 300 MG PO CAPS   Oral   Take 300 mg by mouth 2 (two) times daily.           [name] GLIMEPIRIDE 2 MG PO TABS   Oral   Take 2 mg by mouth 2 (two) times daily.           [name]  GUAIFENESIN 1200 PO   Oral   Take 1 tablet by mouth 2 (two) times daily.           [name] HYDROCODONE-ACETAMINOPHEN 5-500 MG PO TABS   Oral   Take 1-2 tablets by mouth every 6 (six) hours as needed for pain.   15 tablet   0   . OMEGA-3-ACID ETHYL ESTERS 1 G PO CAPS   Oral   Take 1 g by mouth. 4 tablets by mouth daily          . PIOGLITAZONE HCL 15 MG PO TABS   Oral   Take 15 mg by mouth daily.           [name] POTASSIUM CHLORIDE CRYS ER 20 MEQ PO TBCR   Oral   Take 20 mEq by mouth daily.           [name] RAMIPRIL 10 MG PO CAPS   Oral   Take 10 mg by mouth 2 (two) times daily.           [name] TIOTROPIUM BROMIDE MONOHYDRATE 18 MCG IN CAPS   Inhalation   Place 18 mcg into inhaler and inhale daily.           . WARFARIN SODIUM 5 MG PO TABS   Oral   Take 5 mg by mouth daily.             BP 102/70  Pulse 72  Temp 98.6 F (37 C) (Oral)  Resp 16  SpO2 89%  Physical Exam  Constitutional: He is oriented to person, place, and time. He appears well-developed and  well-nourished.  HENT:  Head: Normocephalic and atraumatic.  Eyes: Conjunctivae normal and EOM are normal. Pupils are equal, round, and reactive to light.  Neck: Trachea normal. Neck supple.       No cervical spine tenderness  Cardiovascular: Normal rate, regular rhythm, S1 normal, S2 normal and normal pulses.     No systolic murmur is present   No diastolic murmur is present  Pulses:      Radial pulses are 2+ on the right side, and 2+ on the left side.  Pulmonary/Chest: Effort normal and breath sounds normal. He has no wheezes. He has no rhonchi. He has no rales. He exhibits no tenderness.  Abdominal: Soft. Normal appearance and bowel sounds are normal. There is no tenderness. There is no CVA tenderness and negative Murphy's sign.  Musculoskeletal:       LUE: no point tenderness, pain reproducible with shoulder movement, no erythema or swelling, distal N/V intact, no deformity.   Neurological: He is alert and oriented to person, place, and time. He has normal strength. No cranial nerve deficit or sensory deficit. GCS eye subscore is 4. GCS verbal subscore is 5. GCS motor subscore is 6.  Skin: Skin is warm and dry. No rash noted. He is not diaphoretic.  Psychiatric: His speech is normal.       Cooperative and appropriate    ED Course  Procedures (including critical care time)  Results for orders placed during the hospital encounter of 05/14/12  CBC      Component Value Range   WBC 11.0 (*) 4.0 - 10.5 K/uL   RBC 4.50  4.22 - 5.81 MIL/uL   Hemoglobin 14.4  13.0 - 17.0 g/dL   HCT 98.1  19.1 - 47.8 %   MCV 92.0  78.0 - 100.0 fL   MCH 32.0  26.0 - 34.0 pg   MCHC 34.8  30.0 -  36.0 g/dL   RDW 46.9  62.9 - 52.8 %   Platelets 136 (*) 150 - 400 K/uL  POCT I-STAT, CHEM 8      Component Value Range   Sodium 140  135 - 145 mEq/L   Potassium 3.9  3.5 - 5.1 mEq/L   Chloride 105  96 - 112 mEq/L   BUN 15  6 - 23 mg/dL   Creatinine, Ser 4.13  0.50 - 1.35 mg/dL   Glucose, Bld 244 (*) 70 - 99  mg/dL   Calcium, Ion 0.10  2.72 - 1.30 mmol/L   TCO2 22  0 - 100 mmol/L   Hemoglobin 14.6  13.0 - 17.0 g/dL   HCT 53.6  64.4 - 03.4 %  POCT I-STAT TROPONIN I      Component Value Range   Troponin i, poc 0.00  0.00 - 0.08 ng/mL   Comment 3            Dg Cervical Spine Complete  05/15/2012  *RADIOLOGY REPORT*  Clinical Data: Neck pain radiating into the left upper extremity. No recent injuries.  CERVICAL SPINE - COMPLETE 4+ VIEW  Comparison: Cervical spine x-rays 02/19/2010, 08/31/2008.  Findings: Anatomic alignment.  No visible fractures.  Disc space narrowing and endplate hypertrophic changes at C5-6 and C6-7, unchanged from the prior examination.  Remaining disc spaces well preserved.  Normal prevertebral soft tissues.  Facet joints intact with mild diffuse degenerative changes.  Mild bilateral foraminal stenoses at C5-6 and C6-7.  No static evidence of instability.  No significant interval change.  IMPRESSION: Degenerative disc disease and spondylosis at C5-6 and C6-7.  No acute osseous abnormality.  Stable examination.   Original Report Authenticated By: Hulan Saas, M.D.    Dg Chest Portable 1 View  05/14/2012  *RADIOLOGY REPORT*  Clinical Data: Acute onset left upper extremity pain and chest pain.  Shortness of breath.  Prior CABG.  PORTABLE CHEST - 1 VIEW 05/14/2012 2341 hours  Comparison: Two-view chest x-ray 11/04/2009, 10/15/2008, 10/23/2006.  Findings: Prior sternotomy for CABG.  Cardiac silhouette moderately enlarged but stable.  Lungs clear.  Bronchovascular markings normal.  Pulmonary vascularity normal.  No pneumothorax.  No pleural effusions.  IMPRESSION: Stable cardiomegaly.  No acute cardiopulmonary disease.   Original Report Authenticated By: Hulan Saas, M.D.      Date: 05/14/2012  Rate: 77   Rhythm: atrial fibrillation  QRS Axis: normal  Intervals: normal  ST/T Wave abnormalities: nonspecific ST/T changes  Conduction Disutrbances:none  Narrative Interpretation:    Old EKG Reviewed: unchanged  IV. Cardiac monitoring. IV narcotics pain control. Imaging and labs obtained/ reviewed as above  4:06 AM d/w Dr Kara Pacer, plan admit  MDM    CP with h/o ACS. ECG and work up as above with labs and imaging. MED admit.   Also L arm pain - reproducible not felt to be related to CP, likely MSK in nature, xrays obtained/ reviewed.         Sunnie Nielsen, MD 05/15/12 [phone]

## 2012-05-14 NOTE — ED Notes (Addendum)
From home; c/o sudden onset of left arm pain (9/10), chest pain (5/10) and sob while watching tv. Hx A-fib, HTN, COPD and open heart surgery. VSS. 12 lead A-fib with rate of 74. Received ASA 324 mg, Nitro x 1. Patient's primary complaint is arm pain. To note, left lower arm is wrapped. At ED arrival, patient requests "something to get rid of this arm pain".

## 2012-05-15 ENCOUNTER — Observation Stay (HOSPITAL_COMMUNITY): Payer: Medicare Other

## 2012-05-15 ENCOUNTER — Emergency Department (HOSPITAL_COMMUNITY): Payer: Medicare Other

## 2012-05-15 ENCOUNTER — Encounter (HOSPITAL_COMMUNITY): Payer: Self-pay | Admitting: Internal Medicine

## 2012-05-15 DIAGNOSIS — R079 Chest pain, unspecified: Secondary | ICD-10-CM | POA: Diagnosis present

## 2012-05-15 DIAGNOSIS — R6889 Other general symptoms and signs: Secondary | ICD-10-CM

## 2012-05-15 DIAGNOSIS — M25519 Pain in unspecified shoulder: Secondary | ICD-10-CM

## 2012-05-15 DIAGNOSIS — I4891 Unspecified atrial fibrillation: Secondary | ICD-10-CM

## 2012-05-15 LAB — URINALYSIS, ROUTINE W REFLEX MICROSCOPIC
Glucose, UA: NEGATIVE mg/dL
Hgb urine dipstick: NEGATIVE
Leukocytes, UA: NEGATIVE
Protein, ur: NEGATIVE mg/dL
Specific Gravity, Urine: 1.013 (ref 1.005–1.030)
pH: 5 (ref 5.0–8.0)

## 2012-05-15 LAB — COMPREHENSIVE METABOLIC PANEL
ALT: 19 U/L (ref 0–53)
Alkaline Phosphatase: 75 U/L (ref 39–117)
BUN: 14 mg/dL (ref 6–23)
CO2: 23 mEq/L (ref 19–32)
GFR calc Af Amer: >90 mL/min (ref >90)
GFR calc non Af Amer: >90 mL/min (ref >90)
Glucose, Bld: 158 mg/dL — ABNORMAL HIGH (ref 70–99)
Potassium: 4.2 mEq/L (ref 3.5–5.1)
Sodium: 138 mEq/L (ref 135–145)
Total Protein: 6.6 g/dL (ref 6.0–8.3)

## 2012-05-15 LAB — CBC
MCHC: 34.3 g/dL (ref 30.0–36.0)
Platelets: 132 10*3/uL — ABNORMAL LOW (ref 150–400)
RDW: 13.2 % (ref 11.5–15.5)
WBC: 9.6 10*3/uL (ref 4.0–10.5)

## 2012-05-15 LAB — GLUCOSE, CAPILLARY
Glucose-Capillary: 175 mg/dL — ABNORMAL HIGH (ref 70–99)
Glucose-Capillary: 129 mg/dL — ABNORMAL HIGH (ref 70–99)
Glucose-Capillary: 133 mg/dL — ABNORMAL HIGH (ref 70–99)

## 2012-05-15 LAB — PROTIME-INR: INR: 2.16 — ABNORMAL HIGH (ref 0.00–1.49)

## 2012-05-15 MED ORDER — ONDANSETRON HCL 4 MG PO TABS: 4.0000 mg | ORAL_TABLET | Freq: Four times a day (QID) | ORAL | Status: DC | PRN

## 2012-05-15 MED ORDER — GUAIFENESIN ER 600 MG PO TB12
600.0000 mg | ORAL_TABLET | Freq: Two times a day (BID) | ORAL | Status: DC
  Administered 2012-05-15 – 2012-05-16 (×3): 600 mg via ORAL
  Filled 2012-05-15 (×4): qty 1

## 2012-05-15 MED ORDER — WARFARIN SODIUM 5 MG PO TABS
5.0000 mg | ORAL_TABLET | Freq: Every day | ORAL | Status: DC
  Administered 2012-05-15: 5 mg via ORAL
  Filled 2012-05-15 (×2): qty 1

## 2012-05-15 MED ORDER — ONDANSETRON HCL 4 MG/2ML IJ SOLN: 4.0000 mg | Freq: Four times a day (QID) | INTRAMUSCULAR | Status: DC | PRN

## 2012-05-15 MED ORDER — GABAPENTIN 300 MG PO CAPS
300.0000 mg | ORAL_CAPSULE | Freq: Two times a day (BID) | ORAL | Status: DC
  Administered 2012-05-15 – 2012-05-16 (×3): 300 mg via ORAL
  Filled 2012-05-15 (×4): qty 1

## 2012-05-15 MED ORDER — ONDANSETRON HCL 4 MG/2ML IJ SOLN
4.0000 mg | Freq: Once | INTRAMUSCULAR | Status: AC
  Administered 2012-05-15: 4 mg via INTRAVENOUS
  Filled 2012-05-15: qty 2

## 2012-05-15 MED ORDER — GLIMEPIRIDE 2 MG PO TABS
2.0000 mg | ORAL_TABLET | Freq: Two times a day (BID) | ORAL | Status: DC
  Administered 2012-05-15 – 2012-05-16 (×3): 2 mg via ORAL
  Filled 2012-05-15 (×4): qty 1

## 2012-05-15 MED ORDER — ASPIRIN EC 81 MG PO TBEC
81.0000 mg | DELAYED_RELEASE_TABLET | Freq: Every day | ORAL | Status: DC
  Administered 2012-05-15 – 2012-05-16 (×2): 81 mg via ORAL
  Filled 2012-05-15 (×2): qty 1

## 2012-05-15 MED ORDER — RAMIPRIL 10 MG PO CAPS
10.0000 mg | ORAL_CAPSULE | Freq: Two times a day (BID) | ORAL | Status: DC
  Administered 2012-05-15 – 2012-05-16 (×2): 10 mg via ORAL
  Filled 2012-05-15 (×4): qty 1

## 2012-05-15 MED ORDER — FUROSEMIDE 20 MG PO TABS
20.0000 mg | ORAL_TABLET | Freq: Every day | ORAL | Status: DC
  Administered 2012-05-15 – 2012-05-16 (×2): 20 mg via ORAL
  Filled 2012-05-15 (×2): qty 1

## 2012-05-15 MED ORDER — DULOXETINE HCL 60 MG PO CPEP
60.0000 mg | ORAL_CAPSULE | Freq: Every day | ORAL | Status: DC
  Administered 2012-05-15 – 2012-05-16 (×2): 60 mg via ORAL
  Filled 2012-05-15 (×2): qty 1

## 2012-05-15 MED ORDER — POTASSIUM CHLORIDE CRYS ER 20 MEQ PO TBCR
20.0000 meq | EXTENDED_RELEASE_TABLET | Freq: Every day | ORAL | Status: DC
  Administered 2012-05-15 – 2012-05-16 (×2): 20 meq via ORAL
  Filled 2012-05-15 (×2): qty 1

## 2012-05-15 MED ORDER — DOCUSATE SODIUM 100 MG PO CAPS
100.0000 mg | ORAL_CAPSULE | Freq: Two times a day (BID) | ORAL | Status: DC
  Administered 2012-05-15 – 2012-05-16 (×3): 100 mg via ORAL
  Filled 2012-05-15 (×4): qty 1

## 2012-05-15 MED ORDER — AMLODIPINE BESYLATE 5 MG PO TABS
5.0000 mg | ORAL_TABLET | Freq: Every day | ORAL | Status: DC
  Administered 2012-05-15 – 2012-05-16 (×2): 5 mg via ORAL
  Filled 2012-05-15 (×2): qty 1

## 2012-05-15 MED ORDER — INSULIN ASPART 100 UNIT/ML ~~LOC~~ SOLN: 0.0000 [IU] | Freq: Every day | SUBCUTANEOUS | Status: DC

## 2012-05-15 MED ORDER — FLUTICASONE PROPIONATE HFA 110 MCG/ACT IN AERO
2.0000 | INHALATION_SPRAY | Freq: Two times a day (BID) | RESPIRATORY_TRACT | Status: DC
  Administered 2012-05-15 – 2012-05-16 (×2): 2 via RESPIRATORY_TRACT
  Filled 2012-05-15: qty 12

## 2012-05-15 MED ORDER — HYDROMORPHONE HCL PF 1 MG/ML IJ SOLN
1.0000 mg | Freq: Once | INTRAMUSCULAR | Status: AC
  Administered 2012-05-15: 1 mg via INTRAVENOUS
  Filled 2012-05-15: qty 1

## 2012-05-15 MED ORDER — WARFARIN - PHARMACIST DOSING INPATIENT: Freq: Every day | Status: DC

## 2012-05-15 MED ORDER — MORPHINE SULFATE 4 MG/ML IJ SOLN
4.0000 mg | Freq: Once | INTRAMUSCULAR | Status: AC
  Administered 2012-05-15: 4 mg via INTRAVENOUS
  Filled 2012-05-15: qty 1

## 2012-05-15 MED ORDER — IPRATROPIUM BROMIDE 0.02 % IN SOLN
0.5000 mg | Freq: Four times a day (QID) | RESPIRATORY_TRACT | Status: DC
  Administered 2012-05-15: 0.5 mg via RESPIRATORY_TRACT
  Filled 2012-05-15: qty 2.5

## 2012-05-15 MED ORDER — ACETAMINOPHEN 325 MG PO TABS: 650.0000 mg | ORAL_TABLET | Freq: Four times a day (QID) | ORAL | Status: DC | PRN

## 2012-05-15 MED ORDER — ATORVASTATIN CALCIUM 80 MG PO TABS
80.0000 mg | ORAL_TABLET | Freq: Every day | ORAL | Status: DC
  Administered 2012-05-15 – 2012-05-16 (×2): 80 mg via ORAL
  Filled 2012-05-15 (×2): qty 1

## 2012-05-15 MED ORDER — HYDROCODONE-ACETAMINOPHEN 5-325 MG PO TABS
1.0000 | ORAL_TABLET | ORAL | Status: DC | PRN
  Administered 2012-05-15: 2 via ORAL
  Administered 2012-05-16: 1 via ORAL
  Filled 2012-05-15: qty 1
  Filled 2012-05-15: qty 2

## 2012-05-15 MED ORDER — SODIUM CHLORIDE 0.9 % IJ SOLN
3.0000 mL | Freq: Two times a day (BID) | INTRAMUSCULAR | Status: DC
  Administered 2012-05-15 – 2012-05-16 (×3): 3 mL via INTRAVENOUS

## 2012-05-15 MED ORDER — ACETAMINOPHEN 650 MG RE SUPP: 650.0000 mg | Freq: Four times a day (QID) | RECTAL | Status: DC | PRN

## 2012-05-15 MED ORDER — INSULIN ASPART 100 UNIT/ML ~~LOC~~ SOLN
0.0000 [IU] | Freq: Three times a day (TID) | SUBCUTANEOUS | Status: DC
  Administered 2012-05-15: 2 [IU] via SUBCUTANEOUS
  Administered 2012-05-15: 3 [IU] via SUBCUTANEOUS
  Administered 2012-05-15: 2 [IU] via SUBCUTANEOUS

## 2012-05-15 MED ORDER — IPRATROPIUM BROMIDE 0.02 % IN SOLN: 0.5000 mg | Freq: Four times a day (QID) | RESPIRATORY_TRACT | Status: DC | PRN

## 2012-05-15 MED ORDER — ALBUTEROL SULFATE (5 MG/ML) 0.5% IN NEBU
2.5000 mg | INHALATION_SOLUTION | RESPIRATORY_TRACT | Status: DC | PRN
  Administered 2012-05-15 – 2012-05-16 (×2): 2.5 mg via RESPIRATORY_TRACT
  Filled 2012-05-15 (×2): qty 0.5

## 2012-05-15 NOTE — Progress Notes (Signed)
Subjective: Patient seen , admitted with chest pain. Also has left shoulder pain. Filed Vitals:   05/15/12 0550  BP: 146/90  Pulse: 87  Temp: 98.6 F (37 C)  Resp: 18    Chest: Clear Bilaterally Heart : S1S2 RRR Abdomen: Soft, nontender Ext : Left shoulder ROM is limited due to pain Neuro: Alert, oriented x 3  A/P  Left shoulder pain Will obtain Xray of left shoulder Continue vicodin prn  Chest pain Will follow the cardiac enzymes.     Meredeth Ide [facility]ist/Palliative Medicine Team [phone]

## 2012-05-15 NOTE — Progress Notes (Signed)
ANTIBIOTIC CONSULT NOTE - INITIAL  Pharmacy Consult for Coumadin Indication: Afib  Allergies  Allergen Reactions  . Codeine Itching    Patient Measurements:    Vital Signs: Temp: 99.7 F (37.6 C) (11/07 0108) Temp src: Oral (11/07 0108) BP: 138/90 mmHg (11/07 0445) Pulse Rate: 80  (11/07 0445) Intake/Output from previous day:   Intake/Output from this shift:    Labs:  Basename 05/14/12 2347 05/14/12 2331  WBC -- 11.0*  HGB 14.6 14.4  PLT -- 136*  LABCREA -- --  CREATININE 0.90 --   The CrCl is unknown because both a height and weight (above a minimum accepted value) are required for this calculation. No results found for this basename: VANCOTROUGH:2,VANCOPEAK:2,VANCORANDOM:2,GENTTROUGH:2,GENTPEAK:2,GENTRANDOM:2,TOBRATROUGH:2,TOBRAPEAK:2,TOBRARND:2,AMIKACINPEAK:2,AMIKACINTROU:2,AMIKACIN:2, in the last 72 hours   Microbiology: No results found for this or any previous visit (from the past 720 hour(s)).  Medical History: Past Medical History  Diagnosis Date  . Chronic ischemic heart disease   . Atrial fibrillation   . Peripheral vascular disease   . Hypertension   . Diabetes mellitus   . CAD (coronary artery disease)   . Hyperlipidemia   . Depression   . Actinic keratosis   . Degeneration of cervical intervertebral disc   . Osteoarthrosis, unspecified whether generalized or localized, shoulder region   . Other disorder of muscle, ligament, and fascia   . Carpal tunnel syndrome   . COPD (chronic obstructive pulmonary disease)     Medications:  Prescriptions prior to admission  Medication Sig Dispense Refill  . amLODipine (NORVASC) 5 MG tablet Take 5 mg by mouth daily.        [name] atorvastatin (LIPITOR) 80 MG tablet Take 80 mg by mouth daily.        . DULoxetine (CYMBALTA) 30 MG capsule Take 60 mg by mouth daily.        . fluticasone (FLOVENT HFA) 110 MCG/ACT inhaler Inhale 2 puffs into the lungs 2 (two) times daily.        . furosemide (LASIX) 20 MG tablet  Take 20 mg by mouth daily.        [name] gabapentin (NEURONTIN) 300 MG capsule Take 300 mg by mouth 2 (two) times daily.        [name] glimepiride (AMARYL) 2 MG tablet Take 2 mg by mouth 2 (two) times daily.        . GUAIFENESIN 1200 PO Take 1 tablet by mouth 2 (two) times daily.        [name] HYDROcodone-acetaminophen (VICODIN) 5-500 MG per tablet Take 1-2 tablets by mouth every 6 (six) hours as needed for pain.  15 tablet  0  . omega-3 acid ethyl esters (LOVAZA) 1 G capsule Take 1 g by mouth. 4 tablets by mouth daily       . pioglitazone (ACTOS) 15 MG tablet Take 15 mg by mouth daily.        . potassium chloride SA (K-DUR,KLOR-CON) 20 MEQ tablet Take 20 mEq by mouth daily.        . ramipril (ALTACE) 10 MG capsule Take 10 mg by mouth 2 (two) times daily.        [name] tiotropium (SPIRIVA) 18 MCG inhalation capsule Place 18 mcg into inhaler and inhale daily.        [name] warfarin (COUMADIN) 5 MG tablet Take 5 mg by mouth daily.         Assessment: 66 y.o. male presents with chest pain. On coumadin PTA for afib. Baseline INR 2.16. Last dose taken 11/6. CBC  stable.  Goal of Therapy:  INR 2-3  Plan:  1. Coumadin 5mg  po daily 2. Daily INR  Christoper Fabian, PharmD, BCPS Clinical pharmacist, pager [phone] 05/15/2012,5:43 AM

## 2012-05-15 NOTE — H&P (Signed)
PCP:  MULBERRY,ELIZABETH, MD  Cardiology:  Jacinto Halim  Chief Complaint:   Chest pain  HPI: Wayne [name] is a 66 y.o. male   has a past medical history of Chronic ischemic heart disease; Atrial fibrillation; Peripheral vascular disease; Hypertension; Diabetes mellitus; CAD (coronary artery disease); Hyperlipidemia; Depression; Actinic keratosis; Degeneration of cervical intervertebral disc; Osteoarthrosis, unspecified whether generalized or localized, shoulder region; Other disorder of muscle, ligament, and fascia; Carpal tunnel syndrome; and COPD (chronic obstructive pulmonary disease).   Presented with  He was in bed and started to have diaphoresis, chills and shortness of breath. He had some arm pain and some chest pain associated with that. The arm pain is induced by movement and has been going on for the past 3 weeks. He has severe pain on abduction and have very limited range of motion in Left shoulder.  No fever no cough. Reports good control of his diabetes.   Review of Systems:    Pertinent positives include: chest pain,  Dizziness, nausea,  shortness of breath at rest.  Constitutional:  No weight loss, night sweats, Fevers, chills, fatigue, weight loss  HEENT:  No headaches, Difficulty swallowing,Tooth/dental problems,Sore throat,  No sneezing, itching, ear ache, nasal congestion, post nasal drip,  Cardio-vascular:  NoOrthopnea, PND, anasarca, palpitations.no Bilateral lower extremity swelling  GI:  No heartburn, indigestion, abdominal pain, vomiting, diarrhea, change in bowel habits, loss of appetite, melena, blood in stool, hematemesis Resp:  no No dyspnea on exertion, No excess mucus, no productive cough, No non-productive cough, No coughing up of blood.No change in color of mucus.No wheezing. Skin:  no rash or lesions. No jaundice GU:  no dysuria, change in color of urine, no urgency or frequency. No straining to urinate.  No flank pain.  Musculoskeletal:  No joint pain or  no joint swelling. No decreased range of motion. No back pain.  Psych:  No change in mood or affect. No depression or anxiety. No memory loss.  Neuro: no localizing neurological complaints, no tingling, no weakness, no double vision, no gait abnormality, no slurred speech, no confusion  Otherwise ROS are negative except for above, 10 systems were reviewed  Past Medical History: Past Medical History  Diagnosis Date  . Chronic ischemic heart disease   . Atrial fibrillation   . Peripheral vascular disease   . Hypertension   . Diabetes mellitus   . CAD (coronary artery disease)   . Hyperlipidemia   . Depression   . Actinic keratosis   . Degeneration of cervical intervertebral disc   . Osteoarthrosis, unspecified whether generalized or localized, shoulder region   . Other disorder of muscle, ligament, and fascia   . Carpal tunnel syndrome   . COPD (chronic obstructive pulmonary disease)    Past Surgical History  Procedure Date  . Coronary artery bypass graft   . Coronary angioplasty   . Carpal tunnel release      Medications: Prior to Admission medications   Medication Sig Start Date End Date Taking? Authorizing Provider  amLODipine (NORVASC) 5 MG tablet Take 5 mg by mouth daily.     Yes Historical Provider, MD  atorvastatin (LIPITOR) 80 MG tablet Take 80 mg by mouth daily.     Yes Historical Provider, MD  DULoxetine (CYMBALTA) 30 MG capsule Take 60 mg by mouth daily.     Yes Historical Provider, MD  fluticasone (FLOVENT HFA) 110 MCG/ACT inhaler Inhale 2 puffs into the lungs 2 (two) times daily.     Yes Historical Provider, MD  furosemide (LASIX) 20 MG tablet Take 20 mg by mouth daily.     Yes Historical Provider, MD  gabapentin (NEURONTIN) 300 MG capsule Take 300 mg by mouth 2 (two) times daily.     Yes Historical Provider, MD  glimepiride (AMARYL) 2 MG tablet Take 2 mg by mouth 2 (two) times daily.     Yes Historical Provider, MD  GUAIFENESIN 1200 PO Take 1 tablet by mouth 2  (two) times daily.     Yes Historical Provider, MD  HYDROcodone-acetaminophen (VICODIN) 5-500 MG per tablet Take 1-2 tablets by mouth every 6 (six) hours as needed for pain. 04/24/12  Yes Johnsie Kindred, NP  omega-3 acid ethyl esters (LOVAZA) 1 G capsule Take 1 g by mouth. 4 tablets by mouth daily    Yes Historical Provider, MD  pioglitazone (ACTOS) 15 MG tablet Take 15 mg by mouth daily.     Yes Historical Provider, MD  potassium chloride SA (K-DUR,KLOR-CON) 20 MEQ tablet Take 20 mEq by mouth daily.     Yes Historical Provider, MD  ramipril (ALTACE) 10 MG capsule Take 10 mg by mouth 2 (two) times daily.     Yes Historical Provider, MD  tiotropium (SPIRIVA) 18 MCG inhalation capsule Place 18 mcg into inhaler and inhale daily.     Yes Historical Provider, MD  warfarin (COUMADIN) 5 MG tablet Take 5 mg by mouth daily.     Yes Historical Provider, MD    Allergies:   Allergies  Allergen Reactions  . Codeine Itching    Social History:  Ambulatory  Independently  Lives at home with wife    reports that he has been smoking.  He has never used smokeless tobacco. He reports that he does not drink alcohol or use illicit drugs.   Family History: family history includes Diabetes in his mother; Gout in his brother; and Pneumonia in his father.    Physical Exam: Patient Vitals for the past 24 hrs:  BP Temp Temp src Pulse Resp SpO2  05/15/12 0315 119/81 mmHg - - 85  14  91 %  05/15/12 0245 131/74 mmHg - - 72  15  91 %  05/15/12 0215 130/112 mmHg - - 59  14  90 %  05/15/12 0145 122/92 mmHg - - 90  - 93 %  05/15/12 0115 124/63 mmHg - - 74  - 92 %  05/15/12 0108 135/86 mmHg 99.7 F (37.6 C) Oral 57  18  95 %  05/15/12 0015 126/74 mmHg - - 83  14  90 %  05/14/12 2345 115/79 mmHg - - 78  19  92 %  05/14/12 2334 102/70 mmHg 98.6 F (37 C) Oral 72  16  89 %  05/14/12 2329 - - - - - 100 %    1. General:  in No Acute distress 2. Psychological: Alert and  Oriented 3. Head/ENT:     Dry Mucous  Membranes                          Head Non traumatic, neck supple                          Poor Dentition 4. SKIN: normal   Skin turgor,  Skin clean Dry and intact no rash 5. Heart: Regular rate and rhythm no Murmur, Rub or gallop 6. Lungs:occasional mild wheezes no crackles   7. Abdomen: Soft, non-tender, Non distended 8.  Lower extremities: no clubbing, cyanosis, or edema 9. Neurologically Grossly intact, moving all 4 extremities equally 10. MSK: Normal range of motion except in Left shoulder that is very limitted  body mass index is unknown because there is no height or weight on file.   Labs on Admission:   [facility] 05/14/12 2347  NA 140  K 3.9  CL 105  CO2 --  GLUCOSE 168*  BUN 15  CREATININE 0.90  CALCIUM --  MG --  PHOS --   No results found for this basename: AST:2,ALT:2,ALKPHOS:2,BILITOT:2,PROT:2,ALBUMIN:2 in the last 72 hours No results found for this basename: LIPASE:2,AMYLASE:2 in the last 72 hours  Basename 05/14/12 2347 05/14/12 2331  WBC -- 11.0*  NEUTROABS -- --  HGB 14.6 14.4  HCT 43.0 41.4  MCV -- 92.0  PLT -- 136*   No results found for this basename: CKTOTAL:3,CKMB:3,CKMBINDEX:3,TROPONINI:3 in the last 72 hours No results found for this basename: TSH,T4TOTAL,FREET3,T3FREE,THYROIDAB in the last 72 hours No results found for this basename: VITAMINB12:2,FOLATE:2,FERRITIN:2,TIBC:2,IRON:2,RETICCTPCT:2 in the last 72 hours Lab Results  Component Value Date   HGBA1C 6.5 02/04/2009    The CrCl is unknown because both a height and weight (above a minimum accepted value) are required for this calculation. ABG    Component Value Date/Time   TCO2 22 05/14/2012 2347     No results found for this basename: DDIMER     Other results:  I have pearsonaly reviewed this: ECG REPORT  Rate: 77  Rhythm: atrial fibrilation ST&T Change: no ischemic changes   Cultures: No results found for this basename: sdes, specrequest, cult, reptstatus         Radiological Exams on Admission: Dg Cervical Spine Complete  05/15/2012  *RADIOLOGY REPORT*  Clinical Data: Neck pain radiating into the left upper extremity. No recent injuries.  CERVICAL SPINE - COMPLETE 4+ VIEW  Comparison: Cervical spine x-rays 02/19/2010, 08/31/2008.  Findings: Anatomic alignment.  No visible fractures.  Disc space narrowing and endplate hypertrophic changes at C5-6 and C6-7, unchanged from the prior examination.  Remaining disc spaces well preserved.  Normal prevertebral soft tissues.  Facet joints intact with mild diffuse degenerative changes.  Mild bilateral foraminal stenoses at C5-6 and C6-7.  No static evidence of instability.  No significant interval change.  IMPRESSION: Degenerative disc disease and spondylosis at C5-6 and C6-7.  No acute osseous abnormality.  Stable examination.   Original Report Authenticated By: Hulan Saas, M.D.    Dg Chest Portable 1 View  05/14/2012  *RADIOLOGY REPORT*  Clinical Data: Acute onset left upper extremity pain and chest pain.  Shortness of breath.  Prior CABG.  PORTABLE CHEST - 1 VIEW 05/14/2012 2341 hours  Comparison: Two-view chest x-ray 11/04/2009, 10/15/2008, 10/23/2006.  Findings: Prior sternotomy for CABG.  Cardiac silhouette moderately enlarged but stable.  Lungs clear.  Bronchovascular markings normal.  Pulmonary vascularity normal.  No pneumothorax.  No pleural effusions.  IMPRESSION: Stable cardiomegaly.  No acute cardiopulmonary disease.   Original Report Authenticated By: Hulan Saas, M.D.     Chart has been reviewed  Assessment/Plan  66 yo M w hx of CAD and DM with severe shoulder pain and also chest pain  Present on Admission:  . Chest pain - - given risk factors will admit, monitor on telemetry, cycle cardiac enzymes, obtain serial ECG. Further risk stratify with lipid panel, hgA1C, obtain TSH. Make sure patient is on Aspirin. Further treatment based on the currently pending results.   [name] DIABETES MELLITUS, TYPE  II, UNCONTROLLED -  hold actos, SSI . ESSENTIAL HYPERTENSION - continue home medicaitons . CAD - continue statin and aspirin, cycle cardiac enzymes, monitor on telemetry. Please notify Dr. Jacinto Halim in AM about his patient . FIBRILLATION, ATRIAL - rate controlled, on coumadin  . Shoulder pain - possible frozen shoulder, would have evaluated as an outpatient by orthopedics.    Prophylaxis: Warfarin  CODE STATUS: FULL CODE  Other plan as per orders.  I have spent a total of 55 min on this admission  Wayne [name] 05/15/2012, 4:10 AM

## 2012-05-16 DIAGNOSIS — J449 Chronic obstructive pulmonary disease, unspecified: Secondary | ICD-10-CM

## 2012-05-16 DIAGNOSIS — I10 Essential (primary) hypertension: Secondary | ICD-10-CM

## 2012-05-16 LAB — URINE CULTURE

## 2012-05-16 LAB — GLUCOSE, CAPILLARY: Glucose-Capillary: 86 mg/dL (ref 70–99)

## 2012-05-16 LAB — MAGNESIUM: Magnesium: 1.9 mg/dL (ref 1.5–2.5)

## 2012-05-16 MED ORDER — HYDROCODONE-ACETAMINOPHEN 5-500 MG PO TABS: 1.0000 | ORAL_TABLET | Freq: Four times a day (QID) | ORAL | Status: DC | PRN

## 2012-05-16 NOTE — Progress Notes (Signed)
Alerted by NS/MT that patient HR  Dropping to 30's and 40's  Pauses/brady rhythm.  Remains in afib  / flutter Went to patient room, patient awaken from sleep.   Vs  B/P 133/77  HR 52 afib   O2 sat 97% room air  Some inspiratory wheezes noted.    Dr. Lovell Sheehan notified by [facility]  About decrease HR and pauses.  Governor Specking, RN

## 2012-05-16 NOTE — Progress Notes (Signed)
Pt for discharge home today,  IV D/C . D/C instructions and Rx given with verbalized understanding. Pt walked off unit by staff, with plans to take a bus home.

## 2012-05-16 NOTE — Discharge Summary (Addendum)
Physician Discharge Summary  Wayne [name] OZH:[id number] DOB: Oct 14, 1945 DOA: 05/14/2012  PCP: Julieanne Manson, MD  Admit date: 05/14/2012 Discharge date: 05/16/2012  Time spent: 45  minutes  Recommendations for Outpatient Follow-up:  1. Follow up Ortho in 2 weeks 2. Follow up cardiology in 2 weeks  Discharge Diagnoses:  Active Problems:  DIABETES MELLITUS, TYPE II, UNCONTROLLED  ESSENTIAL HYPERTENSION  CAD  FIBRILLATION, ATRIAL  Chest pain  Shoulder pain   Discharge Condition: Stable  Diet recommendation: Low salt , heart healthy diet  Filed Weights   05/15/12 0550  Weight: 88.4 kg (194 lb 14.2 oz)    History of present illness:  Wayne [name] is a 66 y.o. male  has a past medical history of Chronic ischemic heart disease; Atrial fibrillation; Peripheral vascular disease; Hypertension; Diabetes mellitus; CAD (coronary artery disease); Hyperlipidemia; Depression; Actinic keratosis; Degeneration of cervical intervertebral disc; Osteoarthrosis, unspecified whether generalized or localized, shoulder region; Other disorder of muscle, ligament, and fascia; Carpal tunnel syndrome; and COPD (chronic obstructive pulmonary disease).   Hospital Course:   Chest pain Cardiac enzymes x 3 are negative,. Chest pain is resolved, called and discussed with Dr  Jacinto Halim, he recently had negative cardiac stress test in may this year,  Bradycardia Patient had the HR dropped to 40's while sleeping. Called and discussed with Dr Jacinto Halim, no intervention required at this time. He will follow up with him in 2 weeks  Shoulder pain Xray of the shoulder was negative, Will get a sling and he will see ortho in three days. Outpatient appointment has been made.  CAD Continue statin, aspirin.  Essential hypertension Continue home medications.  Atrial fibrillation Continue coumadin, PT/INR is therapeutic. Heart rate controlled.  Procedures:  None  Consultations:  None  Discharge Exam: Filed  Vitals:   05/15/12 2138 05/16/12 0100 05/16/12 0619 05/16/12 0750  BP: 143/77 133/77 140/81   Pulse: 63 52 47   Temp: 97.9 F (36.6 C)  97.3 F (36.3 C)   TempSrc: Oral  Oral   Resp: 18 18 18    Height:      Weight:      SpO2: 100% 97% 97% 97%    General: In no acute distress Cardiovascular: S1S2 RRR Respiratory: Clear bilatrerally Ext : no edema  Discharge Instructions     Medication List     As of 05/16/2012 10:42 AM    TAKE these medications         amLODipine 5 MG tablet   Commonly known as: NORVASC   Take 5 mg by mouth daily.      atorvastatin 80 MG tablet   Commonly known as: LIPITOR   Take 80 mg by mouth daily.      DULoxetine 30 MG capsule   Commonly known as: CYMBALTA   Take 60 mg by mouth daily.      fluticasone 110 MCG/ACT inhaler   Commonly known as: FLOVENT HFA   Inhale 2 puffs into the lungs 2 (two) times daily.      furosemide 20 MG tablet   Commonly known as: LASIX   Take 20 mg by mouth daily.      gabapentin 300 MG capsule   Commonly known as: NEURONTIN   Take 300 mg by mouth 2 (two) times daily.      glimepiride 2 MG tablet   Commonly known as: AMARYL   Take 2 mg by mouth 2 (two) times daily.      GUAIFENESIN 1200 PO   Take 1 tablet  by mouth 2 (two) times daily.      HYDROcodone-acetaminophen 5-500 MG per tablet   Commonly known as: VICODIN   Take 1-2 tablets by mouth every 6 (six) hours as needed for pain.      LOVAZA 1 G capsule   Generic drug: omega-3 acid ethyl esters   Take 1 g by mouth. 4 tablets by mouth daily      pioglitazone 15 MG tablet   Commonly known as: ACTOS   Take 15 mg by mouth daily.      potassium chloride SA 20 MEQ tablet   Commonly known as: K-DUR,KLOR-CON   Take 20 mEq by mouth daily.      ramipril 10 MG capsule   Commonly known as: ALTACE   Take 10 mg by mouth 2 (two) times daily.      tiotropium 18 MCG inhalation capsule   Commonly known as: SPIRIVA   Place 18 mcg into inhaler and inhale daily.        warfarin 5 MG tablet   Commonly known as: COUMADIN   Take 5 mg by mouth daily.           Follow-up Information    Follow up with [facility], MD. In 1 week.   Contact information:   [address] [place] [address] [phone]       Follow up with Javier Docker, MD. On 05/19/2012. (At 2 45 pm)    Contact information:   [address] [place] [address] [phone]       Follow up with Pamella Pert, MD. On 06/04/2012. (At 11 45 am)    Contact information:   [address] [place] [address] [phone]           The results of significant diagnostics from this hospitalization (including imaging, microbiology, ancillary and laboratory) are listed below for reference.    Significant Diagnostic Studies: Dg Cervical Spine Complete  05/15/2012  *RADIOLOGY REPORT*  Clinical Data: Neck pain radiating into the left upper extremity. No recent injuries.  CERVICAL SPINE - COMPLETE 4+ VIEW  Comparison: Cervical spine x-rays 02/19/2010, 08/31/2008.  Findings: Anatomic alignment.  No visible fractures.  Disc space narrowing and endplate hypertrophic changes at C5-6 and C6-7, unchanged from the prior examination.  Remaining disc spaces well preserved.  Normal prevertebral soft tissues.  Facet joints intact with mild diffuse degenerative changes.  Mild bilateral foraminal stenoses at C5-6 and C6-7.  No static evidence of instability.  No significant interval change.  IMPRESSION: Degenerative disc disease and spondylosis at C5-6 and C6-7.  No acute osseous abnormality.  Stable examination.   Original Report Authenticated By: Hulan Saas, M.D.    Dg Shoulder Right  04/24/2012  *RADIOLOGY REPORT*  Clinical Data: Status post fall 2 weeks ago.  Pain.  RIGHT SHOULDER - 2+ VIEW  Comparison: None.  Findings: There is no acute bony or joint abnormality.  The patient has advanced glenohumeral degenerative  disease.  Moderate acromioclavicular osteoarthritis is also seen.  Soft tissue structures unremarkable.  IMPRESSION:  1.  No acute finding. 2.  Advanced glenohumeral and moderate acromioclavicular degenerative change.   Original Report Authenticated By: Bernadene Bell. D'ALESSIO, M.D.    Dg Chest Portable 1 View  05/14/2012  *RADIOLOGY REPORT*  Clinical Data: Acute onset left upper extremity pain and chest pain.  Shortness of breath.  Prior CABG.  PORTABLE CHEST - 1 VIEW 05/14/2012 2341 hours  Comparison: Two-view chest  x-ray 11/04/2009, 10/15/2008, 10/23/2006.  Findings: Prior sternotomy for CABG.  Cardiac silhouette moderately enlarged but stable.  Lungs clear.  Bronchovascular markings normal.  Pulmonary vascularity normal.  No pneumothorax.  No pleural effusions.  IMPRESSION: Stable cardiomegaly.  No acute cardiopulmonary disease.   Original Report Authenticated By: Hulan Saas, M.D.    Dg Shoulder Left  05/15/2012  *RADIOLOGY REPORT*  Clinical Data: Shoulder pain  LEFT SHOULDER - 2+ VIEW  Comparison: 06/17/2010  Findings: Marked glenohumeral and acromioclavicular osteoarthritis is identified.  No acute fracture or subluxation.  No radiopaque foreign body or soft tissue calcification.  IMPRESSION:  1.  Glenohumeral and acromioclavicular osteoarthritis. 2.  No acute findings.   Original Report Authenticated By: Signa Kell, M.D.     Microbiology: No results found for this or any previous visit (from the past 240 hour(s)).   Labs: Basic Metabolic Panel:  Lab 05/16/12 1610 05/15/12 0640 05/14/12 2347  NA -- 138 140  K -- 4.2 3.9  CL -- 102 105  CO2 -- 23 --  GLUCOSE -- 158* 168*  BUN -- 14 15  CREATININE -- 0.61 0.90  CALCIUM -- 9.1 --  MG 1.9 -- --  PHOS 3.0 -- --   Liver Function Tests:  Lab 05/15/12 0640  AST 28  ALT 19  ALKPHOS 75  BILITOT 0.6  PROT 6.6  ALBUMIN 3.2*   No results found for this basename: LIPASE:5,AMYLASE:5 in the last 168 hours No results found for this  basename: AMMONIA:5 in the last 168 hours CBC:  Lab 05/15/12 0640 05/14/12 2347 05/14/12 2331  WBC 9.6 -- 11.0*  NEUTROABS -- -- --  HGB 14.2 14.6 14.4  HCT 41.4 43.0 41.4  MCV 93.7 -- 92.0  PLT 132* -- 136*   Cardiac Enzymes:  Lab 05/15/12 1719 05/15/12 1011 05/15/12 0640  CKTOTAL -- -- --  CKMB -- -- --  CKMBINDEX -- -- --  TROPONINI <0.30 <0.30 <0.30   BNP: BNP (last 3 results) No results found for this basename: PROBNP:3 in the last 8760 hours CBG:  Lab 05/16/12 0635 05/15/12 2136 05/15/12 1626 05/15/12 1130 05/15/12 0602  GLUCAP 86 84 133* 129* 175*       Signed:  Genevia Bouldin S  [facility] 05/16/2012, 10:42 AM

## 2012-05-16 NOTE — Progress Notes (Signed)
Pt had a pause of 2.66 sec at rest and her HR dropped to 30-40s. RN went into the room and noted pt resting in bed alseep. No indications of distress noted. V/S WNL. MD on-call notified and No order received. Will continue to monitor.

## 2012-05-16 NOTE — Progress Notes (Signed)
ANTICOAGULATION CONSULT NOTE - Follow Up Consult  Pharmacy Consult for coumadin Indication: atrial fibrillation  Allergies  Allergen Reactions  . Codeine Itching    Patient Measurements: Height: 5\' 8"  (172.7 cm) Weight: 194 lb 14.2 oz (88.4 kg) IBW/kg (Calculated) : 68.4    Vital Signs: Temp: 97.3 F (36.3 C) (11/08 0619) Temp src: Oral (11/08 0619) BP: 140/81 mmHg (11/08 0619) Pulse Rate: 47  (11/08 0619)  Labs:  Basename 05/16/12 0450 05/15/12 1719 05/15/12 1011 05/15/12 0640 05/15/12 0408 05/14/12 2347 05/14/12 2331  HGB -- -- -- 14.2 -- 14.6 --  HCT -- -- -- 41.4 -- 43.0 41.4  PLT -- -- -- 132* -- -- 136*  APTT -- -- -- -- -- -- --  LABPROT 25.5* -- -- -- 23.2* -- --  INR 2.46* -- -- -- 2.16* -- --  HEPARINUNFRC -- -- -- -- -- -- --  CREATININE -- -- -- 0.61 -- 0.90 --  CKTOTAL -- -- -- -- -- -- --  CKMB -- -- -- -- -- -- --  TROPONINI -- <0.30 <0.30 <0.30 -- -- --    Estimated Creatinine Clearance: 98.2 ml/min (by C-G formula based on Cr of 0.61).   Medications:  Prescriptions prior to admission  Medication Sig Dispense Refill  . amLODipine (NORVASC) 5 MG tablet Take 5 mg by mouth daily.        [name] atorvastatin (LIPITOR) 80 MG tablet Take 80 mg by mouth daily.        . DULoxetine (CYMBALTA) 30 MG capsule Take 60 mg by mouth daily.        . fluticasone (FLOVENT HFA) 110 MCG/ACT inhaler Inhale 2 puffs into the lungs 2 (two) times daily.        . furosemide (LASIX) 20 MG tablet Take 20 mg by mouth daily.        [name] gabapentin (NEURONTIN) 300 MG capsule Take 300 mg by mouth 2 (two) times daily.        [name] glimepiride (AMARYL) 2 MG tablet Take 2 mg by mouth 2 (two) times daily.        . GUAIFENESIN 1200 PO Take 1 tablet by mouth 2 (two) times daily.        [name] HYDROcodone-acetaminophen (VICODIN) 5-500 MG per tablet Take 1-2 tablets by mouth every 6 (six) hours as needed for pain.  15 tablet  0  . omega-3 acid ethyl esters (LOVAZA) 1 G capsule Take 1 g by mouth. 4 tablets  by mouth daily       . pioglitazone (ACTOS) 15 MG tablet Take 15 mg by mouth daily.        . potassium chloride SA (K-DUR,KLOR-CON) 20 MEQ tablet Take 20 mEq by mouth daily.        . ramipril (ALTACE) 10 MG capsule Take 10 mg by mouth 2 (two) times daily.        [name] tiotropium (SPIRIVA) 18 MCG inhalation capsule Place 18 mcg into inhaler and inhale daily.        [name] warfarin (COUMADIN) 5 MG tablet Take 5 mg by mouth daily.          Assessment: 66 yo M admitted with chest pain. On coumadin PTA for afib.  INR = 2.46 on home dose of 5 mg daily.   Goal of Therapy:  INR 2-3   Plan:  1. Continue coumadin 5 mg po daily 2. INR q MWF - next INR 05/19/12 Herby Abraham, Pharm.D. [phone] 05/16/2012 10:26 AM

## 2013-01-20 ENCOUNTER — Ambulatory Visit (HOSPITAL_COMMUNITY)
Admission: RE | Admit: 2013-01-20 | Discharge: 2013-01-20 | Disposition: A | Discharge status: Home / Self Care | Payer: Medicare Other | Source: Ambulatory Visit | Attending: Internal Medicine | Admitting: Internal Medicine

## 2013-01-20 ENCOUNTER — Other Ambulatory Visit (HOSPITAL_COMMUNITY): Payer: Self-pay | Admitting: Internal Medicine

## 2013-01-20 DIAGNOSIS — J449 Chronic obstructive pulmonary disease, unspecified: Secondary | ICD-10-CM | POA: Insufficient documentation

## 2013-01-20 DIAGNOSIS — J4489 Other specified chronic obstructive pulmonary disease: Secondary | ICD-10-CM | POA: Insufficient documentation

## 2013-04-21 ENCOUNTER — Other Ambulatory Visit: Payer: Self-pay | Admitting: Internal Medicine

## 2013-04-21 DIAGNOSIS — J984 Other disorders of lung: Secondary | ICD-10-CM

## 2013-04-23 ENCOUNTER — Other Ambulatory Visit: Payer: Medicare Other

## 2013-04-27 ENCOUNTER — Ambulatory Visit
Admission: RE | Admit: 2013-04-27 | Discharge: 2013-04-27 | Disposition: A | Discharge status: Home / Self Care | Payer: Medicare Other | Source: Ambulatory Visit | Attending: Internal Medicine | Admitting: Internal Medicine

## 2013-04-27 DIAGNOSIS — J984 Other disorders of lung: Secondary | ICD-10-CM

## 2013-04-27 MED ORDER — IOHEXOL 300 MG/ML  SOLN
75.0000 mL | Freq: Once | INTRAMUSCULAR | Status: AC | PRN
  Administered 2013-04-27: 75 mL via INTRAVENOUS

## 2014-01-19 ENCOUNTER — Emergency Department (HOSPITAL_COMMUNITY): Payer: Medicare Other

## 2014-01-19 ENCOUNTER — Emergency Department (HOSPITAL_COMMUNITY)
Admission: EM | Admit: 2014-01-19 | Discharge: 2014-01-19 | Disposition: A | Discharge status: Home / Self Care | Payer: Medicare Other | Attending: Emergency Medicine | Admitting: Emergency Medicine

## 2014-01-19 ENCOUNTER — Encounter (HOSPITAL_COMMUNITY): Payer: Self-pay | Admitting: Emergency Medicine

## 2014-01-19 DIAGNOSIS — J4489 Other specified chronic obstructive pulmonary disease: Secondary | ICD-10-CM | POA: Insufficient documentation

## 2014-01-19 DIAGNOSIS — Z872 Personal history of diseases of the skin and subcutaneous tissue: Secondary | ICD-10-CM | POA: Diagnosis not present

## 2014-01-19 DIAGNOSIS — Y9389 Activity, other specified: Secondary | ICD-10-CM | POA: Insufficient documentation

## 2014-01-19 DIAGNOSIS — R002 Palpitations: Secondary | ICD-10-CM | POA: Insufficient documentation

## 2014-01-19 DIAGNOSIS — Z9861 Coronary angioplasty status: Secondary | ICD-10-CM | POA: Diagnosis not present

## 2014-01-19 DIAGNOSIS — E119 Type 2 diabetes mellitus without complications: Secondary | ICD-10-CM | POA: Diagnosis not present

## 2014-01-19 DIAGNOSIS — IMO0002 Reserved for concepts with insufficient information to code with codable children: Secondary | ICD-10-CM | POA: Insufficient documentation

## 2014-01-19 DIAGNOSIS — Z8739 Personal history of other diseases of the musculoskeletal system and connective tissue: Secondary | ICD-10-CM | POA: Insufficient documentation

## 2014-01-19 DIAGNOSIS — S01309A Unspecified open wound of unspecified ear, initial encounter: Secondary | ICD-10-CM | POA: Diagnosis not present

## 2014-01-19 DIAGNOSIS — Z7901 Long term (current) use of anticoagulants: Secondary | ICD-10-CM | POA: Insufficient documentation

## 2014-01-19 DIAGNOSIS — F329 Major depressive disorder, single episode, unspecified: Secondary | ICD-10-CM | POA: Insufficient documentation

## 2014-01-19 DIAGNOSIS — S0990XA Unspecified injury of head, initial encounter: Secondary | ICD-10-CM | POA: Diagnosis not present

## 2014-01-19 DIAGNOSIS — Y929 Unspecified place or not applicable: Secondary | ICD-10-CM | POA: Diagnosis not present

## 2014-01-19 DIAGNOSIS — J449 Chronic obstructive pulmonary disease, unspecified: Secondary | ICD-10-CM | POA: Diagnosis not present

## 2014-01-19 DIAGNOSIS — W1809XA Striking against other object with subsequent fall, initial encounter: Secondary | ICD-10-CM | POA: Insufficient documentation

## 2014-01-19 DIAGNOSIS — W11XXXA Fall on and from ladder, initial encounter: Secondary | ICD-10-CM | POA: Diagnosis not present

## 2014-01-19 DIAGNOSIS — I10 Essential (primary) hypertension: Secondary | ICD-10-CM | POA: Insufficient documentation

## 2014-01-19 DIAGNOSIS — I252 Old myocardial infarction: Secondary | ICD-10-CM | POA: Diagnosis not present

## 2014-01-19 DIAGNOSIS — F3289 Other specified depressive episodes: Secondary | ICD-10-CM | POA: Insufficient documentation

## 2014-01-19 DIAGNOSIS — Z8669 Personal history of other diseases of the nervous system and sense organs: Secondary | ICD-10-CM | POA: Insufficient documentation

## 2014-01-19 DIAGNOSIS — I4891 Unspecified atrial fibrillation: Secondary | ICD-10-CM | POA: Insufficient documentation

## 2014-01-19 DIAGNOSIS — W19XXXA Unspecified fall, initial encounter: Secondary | ICD-10-CM

## 2014-01-19 DIAGNOSIS — S298XXA Other specified injuries of thorax, initial encounter: Secondary | ICD-10-CM | POA: Insufficient documentation

## 2014-01-19 DIAGNOSIS — E782 Mixed hyperlipidemia: Secondary | ICD-10-CM | POA: Diagnosis not present

## 2014-01-19 DIAGNOSIS — S3981XA Other specified injuries of abdomen, initial encounter: Secondary | ICD-10-CM | POA: Diagnosis not present

## 2014-01-19 DIAGNOSIS — F17200 Nicotine dependence, unspecified, uncomplicated: Secondary | ICD-10-CM | POA: Insufficient documentation

## 2014-01-19 DIAGNOSIS — I2510 Atherosclerotic heart disease of native coronary artery without angina pectoris: Secondary | ICD-10-CM | POA: Diagnosis not present

## 2014-01-19 DIAGNOSIS — Z951 Presence of aortocoronary bypass graft: Secondary | ICD-10-CM | POA: Insufficient documentation

## 2014-01-19 LAB — CBC WITH DIFFERENTIAL/PLATELET
Basophils Absolute: 0 10*3/uL (ref 0.0–0.1)
Basophils Relative: 0 % (ref 0–1)
EOS ABS: 0.2 10*3/uL (ref 0.0–0.7)
Eosinophils Relative: 2 % (ref 0–5)
HCT: 43.6 % (ref 39.0–52.0)
HEMOGLOBIN: 14.5 g/dL (ref 13.0–17.0)
LYMPHS ABS: 3.1 10*3/uL (ref 0.7–4.0)
LYMPHS PCT: 33 % (ref 12–46)
MCH: 31.8 pg (ref 26.0–34.0)
MCHC: 33.3 g/dL (ref 30.0–36.0)
MCV: 95.6 fL (ref 78.0–100.0)
MONOS PCT: 5 % (ref 3–12)
Monocytes Absolute: 0.5 10*3/uL (ref 0.1–1.0)
NEUTROS PCT: 60 % (ref 43–77)
Neutro Abs: 5.8 10*3/uL (ref 1.7–7.7)
PLATELETS: 139 10*3/uL — AB (ref 150–400)
RBC: 4.56 MIL/uL (ref 4.22–5.81)
RDW: 13.6 % (ref 11.5–15.5)
WBC: 9.6 10*3/uL (ref 4.0–10.5)

## 2014-01-19 LAB — COMPREHENSIVE METABOLIC PANEL
ALBUMIN: 3.7 g/dL (ref 3.5–5.2)
ALT: 27 U/L (ref 0–53)
AST: 45 U/L — AB (ref 0–37)
Alkaline Phosphatase: 107 U/L (ref 39–117)
Anion gap: 14 (ref 5–15)
BUN: 13 mg/dL (ref 6–23)
CO2: 25 meq/L (ref 19–32)
Calcium: 9.3 mg/dL (ref 8.4–10.5)
Chloride: 102 mEq/L (ref 96–112)
Creatinine, Ser: 0.83 mg/dL (ref 0.50–1.35)
GFR calc Af Amer: >90 mL/min (ref >90)
GFR calc non Af Amer: 89 mL/min — ABNORMAL LOW (ref >90)
GLUCOSE: 124 mg/dL — AB (ref 70–99)
POTASSIUM: 4.1 meq/L (ref 3.7–5.3)
SODIUM: 141 meq/L (ref 137–147)
Total Bilirubin: 0.8 mg/dL (ref 0.3–1.2)
Total Protein: 7.1 g/dL (ref 6.0–8.3)

## 2014-01-19 LAB — PROTIME-INR
INR: 1.4 (ref 0.00–1.49)
PROTHROMBIN TIME: 17.2 s — AB (ref 11.6–15.2)

## 2014-01-19 MED ORDER — HYDROCODONE-ACETAMINOPHEN 5-325 MG PO TABS: 2.0000 | ORAL_TABLET | ORAL | Status: DC | PRN

## 2014-01-19 MED ORDER — IOHEXOL 300 MG/ML  SOLN
100.0000 mL | Freq: Once | INTRAMUSCULAR | Status: AC | PRN
  Administered 2014-01-19: 100 mL via INTRAVENOUS

## 2014-01-19 NOTE — ED Notes (Signed)
MD at bedside. 

## 2014-01-19 NOTE — ED Notes (Signed)
Pt in via EMS after falling while on a ladder approx 9ft up today, pt was working on the roof and the ladder fell and landed on him, pt hit left side of body, head hit concrete, denies LOC, pt on xarelto, also c/o left flank pain, and left upper leg pain- given 50 mcg of fentanyl en route, 20g IV in right hand, pt noted to be in controlled rate of AFib on monitor en route, unknown history of same- pt had episode of dizziness and diaphoresis with EMS, symptoms resolved upon arrival. Pt with laceration noted to left upper ear and multiple abrasions to left arm and leg.

## 2014-01-19 NOTE — ED Provider Notes (Addendum)
I performed a history and physical examination of  Wayne [name] and discussed his management with Dr. Pierce Crane. I agree with the history, physical, assessment, and plan of care, with the following exceptions: None I was present for the following procedures: None  Time Spent in Critical Care of the patient: None  Time spent in discussions with the patient and family: 5 minutes  Sherrita Riederer    Date: 01/19/2014  Rate: 68  Rhythm: normal sinus rhythm  QRS Axis: normal  Intervals: normal  ST/T Wave abnormalities: nonspecific ST/T changes  Conduction Disutrbances:none  Narrative Interpretation:   Old EKG Reviewed: unchanged  Pt comes in post fall from a 9 feet high ladder. Pt has some chest tenderness mainly on my exam. Imaging ordered - and is showing no acute findings. Pt is on xarelto. Stable for discharge.    Varney Biles, MD 01/26/14 1644  Ultrasound limited abdominal and limited transthoracic ultrasound (FAST)  Indication: Fall, abdominal pain, anticoagulated Four views were obtained using the low frequency transducer: Splenorenal, Hepatorenal, Retrovesical, Pericardial subxyphoid Interpretation: No free fluid visualized surrounding the kidneys, pelvis or pericardium. Images archived electronically Dr. Kathrynn Humble personally performed and interpreted the images   Varney Biles, MD 01/26/14 1645

## 2014-01-19 NOTE — Discharge Instructions (Signed)
Nodule found on scans requiring further work-up please see you PCP in regards to this        CT Chest W Contrast (Final result)  Result time: 01/19/14 16:54:48    Final result by Rad Results In Interface (01/19/14 16:54:48)    Narrative:   CLINICAL DATA: Fall. Trauma. The patient is on blood thinners.  EXAM: CT CHEST, ABDOMEN, AND PELVIS WITH CONTRAST  TECHNIQUE: Multidetector CT imaging of the chest, abdomen and pelvis was performed following the standard protocol during bolus administration of intravenous contrast.  CONTRAST: 131mL OMNIPAQUE IOHEXOL 300 MG/ML SOLN  COMPARISON: CT chest 04/27/2013.  FINDINGS: CT CHEST FINDINGS  The heart is mildly enlarged. The patient is status post median sternotomy for CABG. Coronary artery calcifications are present. No significant adenopathy is present. No extra thoracic trauma is evident. The thoracic inlet is within normal limits.  An 8.5 mm right lower lobe nodule demonstrates interval growth. No additional nodules are present. Mild dependent atelectasis is present bilaterally.  Bone windows demonstrate degenerative changes of the thoracic spine without evidence for acute trauma. No focal lytic or blastic lesions are evident.  CT ABDOMEN AND PELVIS FINDINGS  The liver and spleen are unremarkable. The stomach, duodenum, and pancreas are within normal limits. Common bile duct is normal. A focal calcification in the gallbladder likely represents a stone. There is no evidence for gallbladder inflammation. A lower pole right renal cyst measures 16 mm. A posterior cyst of the left kidney measures 13 mm. The ureters are within normal limits. Extensive atherosclerotic calcifications are present in the aorta and branch vessels without aneurysm.  The rectosigmoid colon is within normal limits. The more proximal colon is unremarkable. The appendix is visualized and normal. The small bowel is unremarkable.  Stranding just above the  left gluteal area may represent focal trauma and edema. No other superficial injuries are evident.  The bone windows demonstrate degenerative changes of the lumbar spine with chronic endplate changes at K0-9, L4-5, and L5-S1. Leftward curvature of the lumbar spine is centered at L1-2. The urinary bladder is unremarkable.  IMPRESSION: 1. Subcutaneous stranding may be related to acute trauma adjacent to the left iliac bone without an underlying fracture. 2. No other evidence for acute trauma. 3. Moderate degenerative changes throughout the thoracolumbar spine. 4. Interval increase in size of a right lower lobe pulmonary nodule. Recommend PET scan of the chest for further evaluation and possible biopsy. 5. Extensive atherosclerotic changes. 6. Stable cardiomegaly without failure. 7. Status post median sternotomy for CABG.   Electronically Signed By: Lawrence Santiago M.D. On: 01/19/2014 16:54             CT Abdomen Pelvis W Contrast (Final result)  Result time: 01/19/14 16:54:48    Final result by Rad Results In Interface (01/19/14 16:54:48)    Narrative:   CLINICAL DATA: Fall. Trauma. The patient is on blood thinners.  EXAM: CT CHEST, ABDOMEN, AND PELVIS WITH CONTRAST  TECHNIQUE: Multidetector CT imaging of the chest, abdomen and pelvis was performed following the standard protocol during bolus administration of intravenous contrast.  CONTRAST: 170mL OMNIPAQUE IOHEXOL 300 MG/ML SOLN  COMPARISON: CT chest 04/27/2013.  FINDINGS: CT CHEST FINDINGS  The heart is mildly enlarged. The patient is status post median sternotomy for CABG. Coronary artery calcifications are present. No significant adenopathy is present. No extra thoracic trauma is evident. The thoracic inlet is within normal limits.  An 8.5 mm right lower lobe nodule demonstrates interval growth. No additional nodules are present.  Mild dependent atelectasis is present bilaterally.  Bone windows demonstrate  degenerative changes of the thoracic spine without evidence for acute trauma. No focal lytic or blastic lesions are evident.  CT ABDOMEN AND PELVIS FINDINGS  The liver and spleen are unremarkable. The stomach, duodenum, and pancreas are within normal limits. Common bile duct is normal. A focal calcification in the gallbladder likely represents a stone. There is no evidence for gallbladder inflammation. A lower pole right renal cyst measures 16 mm. A posterior cyst of the left kidney measures 13 mm. The ureters are within normal limits. Extensive atherosclerotic calcifications are present in the aorta and branch vessels without aneurysm.  The rectosigmoid colon is within normal limits. The more proximal colon is unremarkable. The appendix is visualized and normal. The small bowel is unremarkable.  Stranding just above the left gluteal area may represent focal trauma and edema. No other superficial injuries are evident.  The bone windows demonstrate degenerative changes of the lumbar spine with chronic endplate changes at I9-5, L4-5, and L5-S1. Leftward curvature of the lumbar spine is centered at L1-2. The urinary bladder is unremarkable.  IMPRESSION: 1. Subcutaneous stranding may be related to acute trauma adjacent to the left iliac bone without an underlying fracture. 2. No other evidence for acute trauma. 3. Moderate degenerative changes throughout the thoracolumbar spine. 4. Interval increase in size of a right lower lobe pulmonary nodule. Recommend PET scan of the chest for further evaluation and possible biopsy. 5. Extensive atherosclerotic changes. 6. Stable cardiomegaly without failure. 7. Status post median sternotomy for CABG.   Electronically Signed By: Bretta Bang.D.

## 2014-01-19 NOTE — ED Provider Notes (Signed)
CSN: [id number]     Arrival date & time 01/19/14  1454 History   First MD Initiated Contact with Patient 01/19/14 1511     Chief Complaint  Patient presents with  . Fall     (Consider location/radiation/quality/duration/timing/severity/associated sxs/prior Treatment) HPI 68 y/o male with PMH of A fib and CAD that presents after a fall from a ladder. Patient states that he lost his balance and fell with no component of dizziness or syncope involved in the fall. Patient states he landed on his left side hitting his head but not having LOC. Patient denies nausea/vomiting does have mild headache. Endorses side pain that is sharp, constant and worse with breathing. Patient does have a small laceration on the left ear that is hemostatic.   Past Medical History  Diagnosis Date  . Chronic ischemic heart disease   . Atrial fibrillation   . Peripheral vascular disease   . Hypertension   . CAD (coronary artery disease)   . Hyperlipidemia   . Depression   . Actinic keratosis   . Degeneration of cervical intervertebral disc   . Osteoarthrosis, unspecified whether generalized or localized, shoulder region   . Other disorder of muscle, ligament, and fascia   . Carpal tunnel syndrome   . COPD (chronic obstructive pulmonary disease)   . Myocardial infarction   . Shortness of breath   . Diabetes mellitus     type 2   Past Surgical History  Procedure Laterality Date  . Coronary artery bypass graft    . Coronary angioplasty    . Carpal tunnel release     Family History  Problem Relation Age of Onset  . Diabetes Mother   . Pneumonia Father   . Gout Brother    History  Substance Use Topics  . Smoking status: Current Every Day Smoker -- 1.50 packs/day for 55 years    Types: Cigarettes  . Smokeless tobacco: Never Used  . Alcohol Use: No    Review of Systems  Constitutional: Negative for activity change.  HENT: Negative for congestion.   Eyes: Negative for visual disturbance.   Respiratory: Negative for cough and shortness of breath.   Cardiovascular: Positive for chest pain. Negative for leg swelling.  Gastrointestinal: Positive for abdominal pain. Negative for blood in stool.  Genitourinary: Negative for dysuria and hematuria.  Musculoskeletal: Negative for back pain.  Skin: Positive for wound. Negative for color change.  Neurological: Negative for syncope and headaches.  Psychiatric/Behavioral: Negative for agitation.      Allergies  Codeine  Home Medications   Prior to Admission medications   Medication Sig Start Date End Date Taking? Authorizing Provider  amLODipine (NORVASC) 5 MG tablet Take 5 mg by mouth daily.      Historical Provider, MD  atorvastatin (LIPITOR) 80 MG tablet Take 80 mg by mouth daily.      Historical Provider, MD  DULoxetine (CYMBALTA) 30 MG capsule Take 60 mg by mouth daily.      Historical Provider, MD  fluticasone (FLOVENT HFA) 110 MCG/ACT inhaler Inhale 2 puffs into the lungs 2 (two) times daily.      Historical Provider, MD  furosemide (LASIX) 20 MG tablet Take 20 mg by mouth daily.      Historical Provider, MD  gabapentin (NEURONTIN) 300 MG capsule Take 300 mg by mouth 2 (two) times daily.      Historical Provider, MD  glimepiride (AMARYL) 2 MG tablet Take 2 mg by mouth 2 (two) times daily.  Historical Provider, MD  GUAIFENESIN 1200 PO Take 1 tablet by mouth 2 (two) times daily.      Historical Provider, MD  HYDROcodone-acetaminophen (VICODIN) 5-500 MG per tablet Take 1-2 tablets by mouth every 6 (six) hours as needed for pain. 05/16/12   Oswald Hillock, MD  omega-3 acid ethyl esters (LOVAZA) 1 G capsule Take 1 g by mouth. 4 tablets by mouth daily     Historical Provider, MD  pioglitazone (ACTOS) 15 MG tablet Take 15 mg by mouth daily.      Historical Provider, MD  potassium chloride SA (K-DUR,KLOR-CON) 20 MEQ tablet Take 20 mEq by mouth daily.      Historical Provider, MD  ramipril (ALTACE) 10 MG capsule Take 10 mg by mouth  2 (two) times daily.      Historical Provider, MD  tiotropium (SPIRIVA) 18 MCG inhalation capsule Place 18 mcg into inhaler and inhale daily.      Historical Provider, MD  warfarin (COUMADIN) 5 MG tablet Take 5 mg by mouth daily.      Historical Provider, MD   BP 121/66  Pulse 46  Temp(Src) 97.5 F (36.4 C) (Oral)  Resp 19  Ht 5\' 9"  (1.753 m)  Wt 180 lb (81.647 kg)  BMI 26.57 kg/m2  SpO2 96% Physical Exam  Nursing note and vitals reviewed. Constitutional: He is oriented to person, place, and time. He appears well-developed and well-nourished.  HENT:  Head: Normocephalic.  Laceration to left   Eyes: EOM are normal. Pupils are equal, round, and reactive to light.  Neck: Neck supple.  Cardiovascular: Normal rate and regular rhythm.  Exam reveals no gallop and no friction rub.   No murmur heard. Pulmonary/Chest: Effort normal. No respiratory distress.  Abdominal: Soft. He exhibits no distension. There is tenderness in the epigastric area and left upper quadrant.  Musculoskeletal: He exhibits no edema.  No pain or deformity of all extremities. Pulse, motor, sensation in all extremities.      Neurological: He is alert and oriented to person, place, and time. No cranial nerve deficit.  Skin: Skin is warm.  No bruising located   Psychiatric: He has a normal mood and affect.   Cranial nerves III-XII grossly intact Strength 5+/5+ to upper and lower extremities bilaterally with resistance applied, equal distribution noted Strength intact to MCP, PIP, DIP joints of  hand Negative arm drift Fine motor skills intact Heel to knee down shin normal bilaterally Gait: deferred     ED Course  Procedures (including critical care time) Labs Review Labs Reviewed  CBC WITH DIFFERENTIAL - Abnormal; Notable for the following:    Platelets 139 (*)    All other components within normal limits  COMPREHENSIVE METABOLIC PANEL - Abnormal; Notable for the following:    Glucose, Bld 124 (*)     AST 45 (*)    GFR calc non Af Amer 89 (*)    All other components within normal limits  PROTIME-INR - Abnormal; Notable for the following:    Prothrombin Time 17.2 (*)    All other components within normal limits    Imaging Review No results found.   EKG Interpretation   Date/Time:  Tuesday January 19 2014 15:03:26 EDT Ventricular Rate:  68 PR Interval:    QRS Duration: 99 QT Interval:  420 QTC Calculation: 447 R Axis:   70 Text Interpretation:  Atrial fibrillation Abnormal T, consider ischemia,  lateral leads ED PHYSICIAN INTERPRETATION AVAILABLE IN [facility]  Confirmed by TEST, Record (  [id number]) on 01/21/2014 7:39:14 AM      MDM   Final diagnoses:  Fall, initial encounter   68 y/o male with known history of a fib. Presents after fall from ladder, he states to physician no syncope of pre-syncope noted prior to fall. Is on coumadin. Patient has pain to the chest wall with palpation, and RUQ.  EKG- noted to be in a fib with no increased rate.   Patient noted to be hemodynamically stable during stay. Did have transient episodes of bradycardia that recovered with out intervention. Patient noted on past EKG to have low baseline heart rate.   Patient evaluated with full trauma scans that did not show acute pathology. It was determined that he would be safe for discharge with return precautions given.     Claudean Severance, MD 01/21/14 [place], MD 01/21/14 [phone]

## 2014-02-03 ENCOUNTER — Encounter (HOSPITAL_COMMUNITY): Payer: Self-pay | Admitting: Emergency Medicine

## 2014-02-03 ENCOUNTER — Emergency Department (HOSPITAL_COMMUNITY)
Admission: EM | Admit: 2014-02-03 | Discharge: 2014-02-03 | Disposition: A | Discharge status: Home / Self Care | Payer: Medicare Other | Attending: Emergency Medicine | Admitting: Emergency Medicine

## 2014-02-03 ENCOUNTER — Emergency Department (HOSPITAL_COMMUNITY): Payer: Medicare Other

## 2014-02-03 DIAGNOSIS — Z9889 Other specified postprocedural states: Secondary | ICD-10-CM | POA: Diagnosis not present

## 2014-02-03 DIAGNOSIS — I4891 Unspecified atrial fibrillation: Secondary | ICD-10-CM | POA: Insufficient documentation

## 2014-02-03 DIAGNOSIS — F329 Major depressive disorder, single episode, unspecified: Secondary | ICD-10-CM | POA: Insufficient documentation

## 2014-02-03 DIAGNOSIS — E119 Type 2 diabetes mellitus without complications: Secondary | ICD-10-CM | POA: Diagnosis not present

## 2014-02-03 DIAGNOSIS — F3289 Other specified depressive episodes: Secondary | ICD-10-CM | POA: Insufficient documentation

## 2014-02-03 DIAGNOSIS — Z7901 Long term (current) use of anticoagulants: Secondary | ICD-10-CM | POA: Insufficient documentation

## 2014-02-03 DIAGNOSIS — M19212 Secondary osteoarthritis, left shoulder: Secondary | ICD-10-CM

## 2014-02-03 DIAGNOSIS — I252 Old myocardial infarction: Secondary | ICD-10-CM | POA: Insufficient documentation

## 2014-02-03 DIAGNOSIS — E785 Hyperlipidemia, unspecified: Secondary | ICD-10-CM | POA: Insufficient documentation

## 2014-02-03 DIAGNOSIS — J4489 Other specified chronic obstructive pulmonary disease: Secondary | ICD-10-CM | POA: Insufficient documentation

## 2014-02-03 DIAGNOSIS — M25519 Pain in unspecified shoulder: Secondary | ICD-10-CM | POA: Insufficient documentation

## 2014-02-03 DIAGNOSIS — M19219 Secondary osteoarthritis, unspecified shoulder: Secondary | ICD-10-CM | POA: Insufficient documentation

## 2014-02-03 DIAGNOSIS — Z79899 Other long term (current) drug therapy: Secondary | ICD-10-CM | POA: Diagnosis not present

## 2014-02-03 DIAGNOSIS — R29898 Other symptoms and signs involving the musculoskeletal system: Secondary | ICD-10-CM | POA: Insufficient documentation

## 2014-02-03 DIAGNOSIS — M25512 Pain in left shoulder: Secondary | ICD-10-CM

## 2014-02-03 DIAGNOSIS — F17200 Nicotine dependence, unspecified, uncomplicated: Secondary | ICD-10-CM | POA: Insufficient documentation

## 2014-02-03 DIAGNOSIS — I10 Essential (primary) hypertension: Secondary | ICD-10-CM | POA: Diagnosis not present

## 2014-02-03 DIAGNOSIS — J449 Chronic obstructive pulmonary disease, unspecified: Secondary | ICD-10-CM | POA: Diagnosis not present

## 2014-02-03 DIAGNOSIS — I2510 Atherosclerotic heart disease of native coronary artery without angina pectoris: Secondary | ICD-10-CM | POA: Insufficient documentation

## 2014-02-03 DIAGNOSIS — Z951 Presence of aortocoronary bypass graft: Secondary | ICD-10-CM | POA: Insufficient documentation

## 2014-02-03 LAB — BASIC METABOLIC PANEL
Anion gap: 15 (ref 5–15)
BUN: 15 mg/dL (ref 6–23)
CO2: 25 meq/L (ref 19–32)
Calcium: 9.2 mg/dL (ref 8.4–10.5)
Chloride: 98 mEq/L (ref 96–112)
Creatinine, Ser: 0.68 mg/dL (ref 0.50–1.35)
GFR calc Af Amer: >90 mL/min (ref >90)
GLUCOSE: 127 mg/dL — AB (ref 70–99)
POTASSIUM: 4.5 meq/L (ref 3.7–5.3)
SODIUM: 138 meq/L (ref 137–147)

## 2014-02-03 LAB — CBC
HCT: 40.2 % (ref 39.0–52.0)
HEMOGLOBIN: 13.5 g/dL (ref 13.0–17.0)
MCH: 31.8 pg (ref 26.0–34.0)
MCHC: 33.6 g/dL (ref 30.0–36.0)
MCV: 94.6 fL (ref 78.0–100.0)
Platelets: 266 10*3/uL (ref 150–400)
RBC: 4.25 MIL/uL (ref 4.22–5.81)
RDW: 13.2 % (ref 11.5–15.5)
WBC: 9.8 10*3/uL (ref 4.0–10.5)

## 2014-02-03 LAB — I-STAT TROPONIN, ED: TROPONIN I, POC: 0 ng/mL (ref 0.00–0.08)

## 2014-02-03 MED ORDER — HYDROMORPHONE HCL PF 1 MG/ML IJ SOLN
2.0000 mg | Freq: Once | INTRAMUSCULAR | Status: AC
  Administered 2014-02-03: 2 mg via INTRAMUSCULAR
  Filled 2014-02-03: qty 2

## 2014-02-03 MED ORDER — ONDANSETRON 4 MG PO TBDP
8.0000 mg | ORAL_TABLET | Freq: Once | ORAL | Status: AC
  Administered 2014-02-03: 8 mg via ORAL
  Filled 2014-02-03: qty 2

## 2014-02-03 MED ORDER — OXYCODONE-ACETAMINOPHEN 5-325 MG PO TABS: 1.0000 | ORAL_TABLET | ORAL | Status: DC | PRN

## 2014-02-03 NOTE — ED Notes (Signed)
Patient transported to X-ray 

## 2014-02-03 NOTE — ED Provider Notes (Signed)
CSN: [id number]     Arrival date & time 02/03/14  89 History   First MD Initiated Contact with Patient 02/03/14 1630     Chief Complaint  Patient presents with  . Extremity Weakness  . Chest Pain     (Consider location/radiation/quality/duration/timing/severity/associated sxs/prior Treatment) Patient is a 68 y.o. male presenting with extremity weakness and chest pain. The history is provided by the patient.  Extremity Weakness Associated symptoms include chest pain.  Chest Pain   He complains of left shoulder pain, and inability to move the left shoulder because of pain that started after a fall 2 weeks ago. He was evaluated at that time and told that he had "bruises". He, states that he needs a shot for the pain, and would like a sling for the left shoulder. He denies additional injury. He denies chest pain, to me. He, states that he has only left shoulder pain. He denies fever, chills, cough, or shortness of breath, weakness, or dizziness. He's not taking anything for the pain, currently. There are no other known modifying factors.  Past Medical History  Diagnosis Date  . Chronic ischemic heart disease   . Atrial fibrillation   . Peripheral vascular disease   . Hypertension   . CAD (coronary artery disease)   . Hyperlipidemia   . Depression   . Actinic keratosis   . Degeneration of cervical intervertebral disc   . Osteoarthrosis, unspecified whether generalized or localized, shoulder region   . Other disorder of muscle, ligament, and fascia   . Carpal tunnel syndrome   . COPD (chronic obstructive pulmonary disease)   . Myocardial infarction   . Shortness of breath   . Diabetes mellitus     type 2   Past Surgical History  Procedure Laterality Date  . Coronary artery bypass graft    . Coronary angioplasty    . Carpal tunnel release     Family History  Problem Relation Age of Onset  . Diabetes Mother   . Pneumonia Father   . Gout Brother    History  Substance Use  Topics  . Smoking status: Current Every Day Smoker -- 1.50 packs/day for 55 years    Types: Cigarettes  . Smokeless tobacco: Never Used  . Alcohol Use: No    Review of Systems  Cardiovascular: Positive for chest pain.  Musculoskeletal: Positive for extremity weakness.  All other systems reviewed and are negative.     Allergies  Codeine  Home Medications   Prior to Admission medications   Medication Sig Start Date End Date Taking? Authorizing Provider  amLODipine (NORVASC) 5 MG tablet Take 5 mg by mouth daily.     Yes Historical Provider, MD  atorvastatin (LIPITOR) 80 MG tablet Take 80 mg by mouth daily.     Yes Historical Provider, MD  DULoxetine (CYMBALTA) 30 MG capsule Take 60 mg by mouth daily.     Yes Historical Provider, MD  fluticasone (FLOVENT HFA) 110 MCG/ACT inhaler Inhale 2 puffs into the lungs daily as needed.    Yes Historical Provider, MD  furosemide (LASIX) 20 MG tablet Take 20 mg by mouth daily.     Yes Historical Provider, MD  gabapentin (NEURONTIN) 300 MG capsule Take 300 mg by mouth 2 (two) times daily.     Yes Historical Provider, MD  glimepiride (AMARYL) 2 MG tablet Take 2 mg by mouth 2 (two) times daily.     Yes Historical Provider, MD  omega-3 acid ethyl esters (LOVAZA) 1 G  capsule Take 1 g by mouth. 4 tablets by mouth daily    Yes Historical Provider, MD  pioglitazone (ACTOS) 15 MG tablet Take 15 mg by mouth daily.     Yes Historical Provider, MD  potassium chloride SA (K-DUR,KLOR-CON) 20 MEQ tablet Take 20 mEq by mouth daily.     Yes Historical Provider, MD  ramipril (ALTACE) 10 MG capsule Take 10 mg by mouth 2 (two) times daily.     Yes Historical Provider, MD  rivaroxaban (XARELTO) 20 MG TABS tablet Take 20 mg by mouth daily. Patient and family member states he had xarelto today per patient   Yes Historical Provider, MD  tiotropium (SPIRIVA) 18 MCG inhalation capsule Place 18 mcg into inhaler and inhale daily.     Yes Historical Provider, MD  traMADol  (ULTRAM) 50 MG tablet Take 50 mg by mouth every 6 (six) hours as needed for moderate pain.   Yes Historical Provider, MD   BP 117/59  Pulse 95  Temp(Src) 98 F (36.7 C) (Oral)  Resp 21  SpO2 100% Physical Exam  Nursing note and vitals reviewed. Constitutional: He is oriented to person, place, and time. He appears well-developed and well-nourished.  HENT:  Head: Normocephalic and atraumatic.  Right Ear: External ear normal.  Left Ear: External ear normal.  Eyes: Conjunctivae and EOM are normal. Pupils are equal, round, and reactive to light.  Neck: Normal range of motion and phonation normal. Neck supple.  Cardiovascular: Normal rate, regular rhythm, normal heart sounds and intact distal pulses.   Pulmonary/Chest: Effort normal and breath sounds normal. He exhibits no bony tenderness.  Abdominal: Soft. There is no tenderness.  Musculoskeletal: He exhibits no edema.  Left shoulder is tender to palpation, and he resists passive range of motion secondary to pain in the left shoulder. There is no left shoulder deformity.  Neurological: He is alert and oriented to person, place, and time. No cranial nerve deficit or sensory deficit. He exhibits normal muscle tone. Coordination normal.  Normal strength and sensation in the arms, and legs, bilaterally. There is no evidence for left arm neuropathy.  Skin: Skin is warm, dry and intact.  Psychiatric: He has a normal mood and affect. His behavior is normal. Judgment and thought content normal.    ED Course  Procedures (including critical care time) Medications  HYDROmorphone (DILAUDID) injection 2 mg (2 mg Intramuscular Given 02/03/14 1745)  ondansetron (ZOFRAN-ODT) disintegrating tablet 8 mg (8 mg Oral Given 02/03/14 1744)    Patient Vitals for the past 24 hrs:  BP Temp Temp src Pulse Resp SpO2  02/03/14 1730 109/63 mmHg - - 80 16 90 %  02/03/14 1645 115/65 mmHg - - 86 25 95 %  02/03/14 1557 117/59 mmHg - - 95 21 100 %  02/03/14 1538 97/61  mmHg 98 F (36.7 C) Oral 85 18 95 %    6:30 PM Reevaluation with update and discussion. After initial assessment and treatment, an updated evaluation reveals he feels better, at this time. Findings discussed with patient, all questions answered. [place] METABOLIC PANEL - Abnormal; Notable for the following:    Glucose, Bld 127 (*)    All other components within normal limits  CBC  I-STAT TROPOININ, ED    Imaging Review Dg Chest 2 View  02/03/2014   CLINICAL DATA:  Intermittent chest pain and decreased range of motion in the left arm; history of atrial fibrillation and coronary artery disease  EXAM: CHEST  2 VIEW  COMPARISON:  Portable chest x-ray of January 19, 2014.  FINDINGS: The lungs are adequately inflated. There is a small amount of fluid posteriorly in the costophrenic gutter on the left. The heart is mildly enlarged. The pulmonary vascularity is not engorged. The patient has undergone previous CABG. The bony structures exhibit no acute abnormalities. There are degenerative changes of both shoulders and of the thoracic discs.  IMPRESSION: There is a small left pleural effusion. There is no pulmonary edema nor alveolar pneumonia.   Electronically Signed   By: David  Martinique   On: 02/03/2014 16:20     EKG Interpretation   Date/Time:  Wednesday February 03 2014 15:34:34 EDT Ventricular Rate:  88 PR Interval:    QRS Duration: 86 QT Interval:  342 QTC Calculation: 413 R Axis:   79 Text Interpretation:  Atrial fibrillation T wave abnormality, consider  inferior ischemia Abnormal ECG since last tracing no significant change  Confirmed by Eulis Foster  MD, [place] [phone]) on 02/03/2014 4:56:55 PM       EKG Interpretation  Date/Time:  Wednesday February 03 2014 15:59:43 EDT Ventricular Rate:  89 PR Interval:    QRS Duration: 97 QT Interval:  401 QTC Calculation: 488 R Axis:   73 Text Interpretation:  Atrial fibrillation Borderline low voltage,  extremity leads Nonspecific T abnormalities, lateral leads Borderline prolonged QT interval Since last tracing QT has lengthened and inferior ischemic abnormality is less apparent Confirmed by Eulis Foster  MD, [place] ([id number]) on 02/03/2014 5:23:53 PM       MDM   Final diagnoses:  Shoulder pain, left  Secondary osteoarthritis of left shoulder    Shoulder pain, and arthritis related to degenerative joint disease and subacute trauma. Doubt fracture, cervical myelopathy or septic arthritis  Nursing Notes Reviewed/ Care Coordinated Applicable Imaging Reviewed Interpretation of Laboratory Data incorporated into ED treatment  The patient appears reasonably screened and/or stabilized for discharge and I doubt any other medical condition or other [facility] requiring further screening, evaluation, or treatment in the ED at this time prior to discharge.  Plan: Home Medications- Percocet; Home Treatments- Sling prn, Heat; return here if the recommended treatment, does not improve the symptoms; Recommended follow up- Ortho or PCP prn    Richarda Blade, MD 02/05/14 1159

## 2014-02-03 NOTE — ED Notes (Signed)
Carollee Sires [phone] call with updates

## 2014-02-03 NOTE — Discharge Instructions (Signed)
Arthritis, Nonspecific °Arthritis is inflammation of a joint. This usually means pain, redness, warmth or swelling are present. One or more joints may be involved. There are a number of types of arthritis. Your caregiver may not be able to tell what type of arthritis you have right away. °CAUSES  °The most common cause of arthritis is the wear and tear on the joint (osteoarthritis). This causes damage to the cartilage, which can break down over time. The knees, hips, back and neck are most often affected by this type of arthritis. °Other types of arthritis and common causes of joint pain include: °· Sprains and other injuries near the joint. Sometimes minor sprains and injuries cause pain and swelling that develop hours later. °· Rheumatoid arthritis. This affects hands, feet and knees. It usually affects both sides of your body at the same time. It is often associated with chronic ailments, fever, weight loss and general weakness. °· Crystal arthritis. Gout and pseudo gout can cause occasional acute severe pain, redness and swelling in the foot, ankle, or knee. °· Infectious arthritis. Bacteria can get into a joint through a break in overlying skin. This can cause infection of the joint. Bacteria and viruses can also spread through the blood and affect your joints. °· Drug, infectious and allergy reactions. Sometimes joints can become mildly painful and slightly swollen with these types of illnesses. °SYMPTOMS  °· Pain is the main symptom. °· Your joint or joints can also be red, swollen and warm or hot to the touch. °· You may have a fever with certain types of arthritis, or even feel overall ill. °· The joint with arthritis will hurt with movement. Stiffness is present with some types of arthritis. °DIAGNOSIS  °Your caregiver will suspect arthritis based on your description of your symptoms and on your exam. Testing may be needed to find the type of arthritis: °· Blood and sometimes urine tests. °· X-ray tests  and sometimes CT or MRI scans. °· Removal of fluid from the joint (arthrocentesis) is done to check for bacteria, crystals or other causes. Your caregiver (or a specialist) will numb the area over the joint with a local anesthetic, and use a needle to remove joint fluid for examination. This procedure is only minimally uncomfortable. °· Even with these tests, your caregiver may not be able to tell what kind of arthritis you have. Consultation with a specialist (rheumatologist) may be helpful. °TREATMENT  °Your caregiver will discuss with you treatment specific to your type of arthritis. If the specific type cannot be determined, then the following general recommendations may apply. °Treatment of severe joint pain includes: °· Rest. °· Elevation. °· Anti-inflammatory medication (for example, ibuprofen) may be prescribed. Avoiding activities that cause increased pain. °· Only take over-the-counter or prescription medicines for pain and discomfort as recommended by your caregiver. °· Cold packs over an inflamed joint may be used for 10 to 15 minutes every hour. Hot packs sometimes feel better, but do not use overnight. Do not use hot packs if you are diabetic without your caregiver's permission. °· A cortisone shot into arthritic joints may help reduce pain and swelling. °· Any acute arthritis that gets worse over the next 1 to 2 days needs to be looked at to be sure there is no joint infection. °Long-term arthritis treatment involves modifying activities and lifestyle to reduce joint stress jarring. This can include weight loss. Also, exercise is needed to nourish the joint cartilage and remove waste. This helps keep the muscles   around the joint strong. HOME CARE INSTRUCTIONS   Do not take aspirin to relieve pain if gout is suspected. This elevates uric acid levels.  Only take over-the-counter or prescription medicines for pain, discomfort or fever as directed by your caregiver.  Rest the joint as much as  possible.  If your joint is swollen, keep it elevated.  Use crutches if the painful joint is in your leg.  Drinking plenty of fluids may help for certain types of arthritis.  Follow your caregiver's dietary instructions.  Try low-impact exercise such as:  Swimming.  Water aerobics.  Biking.  Walking.  Morning stiffness is often relieved by a warm shower.  Put your joints through regular range-of-motion. SEEK MEDICAL CARE IF:   You do not feel better in 24 hours or are getting worse.  You have side effects to medications, or are not getting better with treatment. SEEK IMMEDIATE MEDICAL CARE IF:   You have a fever.  You develop severe joint pain, swelling or redness.  Many joints are involved and become painful and swollen.  There is severe back pain and/or leg weakness.  You have loss of bowel or bladder control. Document Released: 08/02/2004 Document Revised: 09/17/2011 Document Reviewed: 08/18/2008 [facility] Patient Information 2015 [place], [place]. This information is not intended to replace advice given to you by your health care provider. Make sure you discuss any questions you have with your health care provider.   Arthralgia Your caregiver has diagnosed you as suffering from an arthralgia. Arthralgia means there is pain in a joint. This can come from many reasons including:  Bruising the joint which causes soreness (inflammation) in the joint.  Wear and tear on the joints which occur as we grow older (osteoarthritis).  Overusing the joint.  Various forms of arthritis.  Infections of the joint. Regardless of the cause of pain in your joint, most of these different pains respond to anti-inflammatory drugs and rest. The exception to this is when a joint is infected, and these cases are treated with antibiotics, if it is a bacterial infection. HOME CARE INSTRUCTIONS   Rest the injured area for as long as directed by your caregiver. Then slowly start using  the joint as directed by your caregiver and as the pain allows. Crutches as directed may be useful if the ankles, knees or hips are involved. If the knee was splinted or casted, continue use and care as directed. If an stretchy or elastic wrapping bandage has been applied today, it should be removed and re-applied every 3 to 4 hours. It should not be applied tightly, but firmly enough to keep swelling down. Watch toes and feet for swelling, bluish discoloration, coldness, numbness or excessive pain. If any of these problems (symptoms) occur, remove the ace bandage and re-apply more loosely. If these symptoms persist, contact your caregiver or return to this location.  For the first 24 hours, keep the injured extremity elevated on pillows while lying down.  Apply ice for 15-20 minutes to the sore joint every couple hours while awake for the first half day. Then 03-04 times per day for the first 48 hours. Put the ice in a plastic bag and place a towel between the bag of ice and your skin.  Wear any splinting, casting, elastic bandage applications, or slings as instructed.  Only take over-the-counter or prescription medicines for pain, discomfort, or fever as directed by your caregiver. Do not use aspirin immediately after the injury unless instructed by your physician. Aspirin can  cause increased bleeding and bruising of the tissues.  If you were given crutches, continue to use them as instructed and do not resume weight bearing on the sore joint until instructed. Persistent pain and inability to use the sore joint as directed for more than 2 to 3 days are warning signs indicating that you should see a caregiver for a follow-up visit as soon as possible. Initially, a hairline fracture (break in bone) may not be evident on X-rays. Persistent pain and swelling indicate that further evaluation, non-weight bearing or use of the joint (use of crutches or slings as instructed), or further X-rays are indicated.  X-rays may sometimes not show a small fracture until a week or 10 days later. Make a follow-up appointment with your own caregiver or one to whom we have referred you. A radiologist (specialist in reading X-rays) may read your X-rays. Make sure you know how you are to obtain your X-ray results. Do not assume everything is normal if you do not hear from [place]. SEEK MEDICAL CARE IF: Bruising, swelling, or pain increases. SEEK IMMEDIATE MEDICAL CARE IF:   Your fingers or toes are numb or blue.  The pain is not responding to medications and continues to stay the same or get worse.  The pain in your joint becomes severe.  You develop a fever over 102 F (38.9 C).  It becomes impossible to move or use the joint. MAKE SURE YOU:   Understand these instructions.  Will watch your condition.  Will get help right away if you are not doing well or get worse. Document Released: 06/25/2005 Document Revised: 09/17/2011 Document Reviewed: 02/11/2008 [facility] Patient Information 2015 [place], [place]. This information is not intended to replace advice given to you by your health care provider. Make sure you discuss any questions you have with your health care provider.

## 2014-02-03 NOTE — ED Notes (Signed)
Pt reports waking up this am with pain and unable to move left arm. Arm and grip are weak. Speech is fine, no facial droop noted, pt denies any weakness to left leg. Reports also having intermittent chest pains. Was seen here on 7/14 for fall and had ct scan done at that time.

## 2014-02-03 NOTE — ED Notes (Signed)
Pt placed in gown and in bed. Pt monitored by pulse ox, 12-lead, and bp cuff.

## 2014-03-03 ENCOUNTER — Ambulatory Visit (INDEPENDENT_AMBULATORY_CARE_PROVIDER_SITE_OTHER): Payer: Medicare Other | Admitting: Internal Medicine

## 2014-03-03 ENCOUNTER — Encounter: Payer: Self-pay | Admitting: Internal Medicine

## 2014-03-03 VITALS — BP 130/72 | HR 104 | Ht 68.0 in | Wt 166.0 lb

## 2014-03-03 DIAGNOSIS — R911 Solitary pulmonary nodule: Secondary | ICD-10-CM

## 2014-03-03 NOTE — Progress Notes (Signed)
Subjective:    Patient ID: Wayne [name], male    DOB: 10-05-1945, 68 y.o.   MRN: [id number] PCP Philis Fendt, MD   HPI  IOV 03/03/2014   Chief Complaint  Patient presents with  . Pulmonary Consult    Referred by Dr. Einar Gip for lung nodules.    68 year old male accompanied by his grandson. He is a very poor historian. History is gained by referring physician cardiologist Dr. Einar Gip notes and the grandson. Patient is a smoker with coronary artery disease.  In October 2014 review of the charts indicate that his primary care physician did a CT scan of the chest based on a chest x-ray suggesting pulmonary nodule. The CT scan of the chest showed a right lower lobe noncalcified smooth 6.5 mm nodule. Patient is unaware of this. In vegetables and 15 he had a fall from a ladder and this was noted in the [facility] CT protocol that showed an increase in this nodule 8.5 mm right lower lobe smooth nodule. Therefore he is being referred. Patient also denies any baseline dyspnea, cough. Prior to the fall he was extremely active mowing the yard. Since the fall he continues to have muscular skeletal pain although the body.  His coronary artery disease and according to the referral notes get a normal ejection fraction of 50% in May 2013 and appears that he had a nuclear cardiac stress test probably April 2008 that was normal the patient and his grandson and says that cardiologist has ruled out active coronary artery disease    Past Medical History  Diagnosis Date  . Chronic ischemic heart disease   . Atrial fibrillation   . Peripheral vascular disease   . Hypertension   . CAD (coronary artery disease)   . Hyperlipidemia   . Depression   . Actinic keratosis   . Degeneration of cervical intervertebral disc   . Osteoarthrosis, unspecified whether generalized or localized, shoulder region   . Other disorder of muscle, ligament, and fascia   . Carpal tunnel syndrome   . COPD (chronic obstructive  pulmonary disease)   . Myocardial infarction   . Shortness of breath   . Diabetes mellitus     type 2     Family History  Problem Relation Age of Onset  . Diabetes Mother   . Pneumonia Father   . Gout Brother      History   Social History  . Marital Status: Legally Separated    Spouse Name: N/A    Number of Children: 3  . Years of Education: N/A   Occupational History  . DISABLED    Social History Main Topics  . Smoking status: Current Every Day Smoker -- 0.50 packs/day for 56 years    Types: Cigarettes  . Smokeless tobacco: Never Used  . Alcohol Use: No  . Drug Use: No  . Sexual Activity: Not on file   Other Topics Concern  . Not on file   Social History Narrative  . No narrative on file     Allergies  Allergen Reactions  . Codeine Itching     Outpatient Prescriptions Prior to Visit  Medication Sig Dispense Refill  . atorvastatin (LIPITOR) 80 MG tablet Take 80 mg by mouth daily.        . DULoxetine (CYMBALTA) 30 MG capsule Take 60 mg by mouth daily.        [name] gabapentin (NEURONTIN) 300 MG capsule Take 300 mg by mouth 2 (two) times daily.        [name]  glimepiride (AMARYL) 2 MG tablet Take 2 mg by mouth 2 (two) times daily.        [name] omega-3 acid ethyl esters (LOVAZA) 1 G capsule Take 1 g by mouth. 4 tablets by mouth daily       . pioglitazone (ACTOS) 15 MG tablet Take 15 mg by mouth daily.        . rivaroxaban (XARELTO) 20 MG TABS tablet Take 20 mg by mouth daily. Patient and family member states he had xarelto today per patient      . traMADol (ULTRAM) 50 MG tablet Take 50 mg by mouth every 6 (six) hours as needed for moderate pain.      [name] amLODipine (NORVASC) 5 MG tablet Take 5 mg by mouth daily.        . fluticasone (FLOVENT HFA) 110 MCG/ACT inhaler Inhale 2 puffs into the lungs daily as needed.       . furosemide (LASIX) 20 MG tablet Take 20 mg by mouth daily.        [name] oxyCODONE-acetaminophen (PERCOCET) 5-325 MG per tablet Take 1 tablet by mouth every 4 (four)  hours as needed for severe pain.  20 tablet  0  . potassium chloride SA (K-DUR,KLOR-CON) 20 MEQ tablet Take 20 mEq by mouth daily.        . ramipril (ALTACE) 10 MG capsule Take 10 mg by mouth 2 (two) times daily.        [name] tiotropium (SPIRIVA) 18 MCG inhalation capsule Place 18 mcg into inhaler and inhale daily.         No facility-administered medications prior to visit.       Review of Systems  Constitutional: Negative for fever and unexpected weight change.  HENT: Negative for congestion, dental problem, ear pain, nosebleeds, postnasal drip, rhinorrhea, sinus pressure, sneezing, sore throat and trouble swallowing.   Eyes: Negative for redness and itching.  Respiratory: Positive for cough and shortness of breath. Negative for chest tightness and wheezing.   Cardiovascular: Positive for chest pain, palpitations and leg swelling.  Gastrointestinal: Negative for nausea and vomiting.  Genitourinary: Negative for dysuria.  Musculoskeletal: Negative for joint swelling.  Skin: Negative for rash.  Neurological: Negative for headaches.  Hematological: Does not bruise/bleed easily.  Psychiatric/Behavioral: Negative for dysphoric mood. The patient is not nervous/anxious.        Objective:   Physical Exam  Nursing note and vitals reviewed. Constitutional: He is oriented to person, place, and time. He appears well-developed and well-nourished. No distress.  HENT:  Head: Normocephalic and atraumatic.  Right Ear: External ear normal.  Left Ear: External ear normal.  Mouth/Throat: Oropharynx is clear and moist. No oropharyngeal exudate.  Eyes: Conjunctivae and EOM are normal. Pupils are equal, round, and reactive to light. Right eye exhibits no discharge. Left eye exhibits no discharge. No scleral icterus.  Neck: Normal range of motion. Neck supple. No JVD present. No tracheal deviation present. No thyromegaly present.  Cardiovascular: Normal rate, regular rhythm and intact distal pulses.   Exam reveals no gallop and no friction rub.   No murmur heard. Pulmonary/Chest: Effort normal and breath sounds normal. No respiratory distress. He has no wheezes. He has no rales. He exhibits no tenderness.  Scar central chest  Abdominal: Soft. Bowel sounds are normal. He exhibits no distension and no mass. There is no tenderness. There is no rebound and no guarding.  Musculoskeletal: Normal range of motion. He exhibits no edema and no tenderness.  Antalgic gait  Lymphadenopathy:  He has no cervical adenopathy.  Neurological: He is alert and oriented to person, place, and time. He has normal reflexes. No cranial nerve deficit. Coordination normal.  Skin: Skin is warm and dry. No rash noted. He is not diaphoretic. No erythema. No pallor.  Psychiatric: He has a normal mood and affect. His behavior is normal. Judgment and thought content normal.    Filed Vitals:   03/03/14 1434  BP: 130/72  Pulse: 104  Height: 5\' 8"  (1.727 m)  Weight: 166 lb (75.297 kg)  SpO2: 95%     Filed Vitals:   03/03/14 1434  BP: 130/72  Pulse: 104  Height: 5\' 8"  (1.727 m)  Weight: 166 lb (75.297 kg)  SpO2: 95%         Assessment & Plan:  #Right lower lobe lung nodule  - 6.2mm in Oct 2014 -> 8.44mm July 2015  - fair possibility this is early stage lung cancer but we do not know for sure  - do PET Scan - do full PFT  #Followup  -return to see me or NP Tammy for further discussion after above ; < 3 weeks

## 2014-03-03 NOTE — Patient Instructions (Signed)
#  Right lower lobe lung nodule  - 6.67mm in Oct 2014 -> 8.45mm July 2015  - fair possibility this is early stage lung cancer but we do not know for sure  - do PET Scan - do full PFT  #Followup  -return to see me or NP Tammy for further discussion after above ; < 3 weeks

## 2014-03-06 NOTE — Assessment & Plan Note (Signed)
#  Right lower lobe lung nodule  - 6.99mm in Oct 2014 -> 8.34mm July 2015  - fair possibility this is early stage lung cancer but we do not know for sure  - do PET Scan - do full PFT  #Followup  -return to see me or NP Tammy for further discussion after above ; < 3 weeks

## 2014-03-08 ENCOUNTER — Ambulatory Visit (INDEPENDENT_AMBULATORY_CARE_PROVIDER_SITE_OTHER): Payer: Medicare Other | Admitting: Adult Health

## 2014-03-08 ENCOUNTER — Ambulatory Visit (HOSPITAL_COMMUNITY)
Admission: RE | Admit: 2014-03-08 | Discharge: 2014-03-08 | Disposition: A | Discharge status: Home / Self Care | Payer: Medicare Other | Source: Ambulatory Visit | Attending: Internal Medicine | Admitting: Internal Medicine

## 2014-03-08 ENCOUNTER — Telehealth: Payer: Self-pay | Admitting: Internal Medicine

## 2014-03-08 ENCOUNTER — Encounter (HOSPITAL_COMMUNITY): Payer: Self-pay

## 2014-03-08 ENCOUNTER — Encounter: Payer: Self-pay | Admitting: Adult Health

## 2014-03-08 VITALS — BP 98/64 | HR 82 | Temp 98.0°F | Ht 65.0 in | Wt 166.0 lb

## 2014-03-08 DIAGNOSIS — S2249XA Multiple fractures of ribs, unspecified side, initial encounter for closed fracture: Secondary | ICD-10-CM | POA: Insufficient documentation

## 2014-03-08 DIAGNOSIS — R911 Solitary pulmonary nodule: Secondary | ICD-10-CM

## 2014-03-08 DIAGNOSIS — X58XXXA Exposure to other specified factors, initial encounter: Secondary | ICD-10-CM | POA: Insufficient documentation

## 2014-03-08 DIAGNOSIS — Y929 Unspecified place or not applicable: Secondary | ICD-10-CM | POA: Diagnosis not present

## 2014-03-08 DIAGNOSIS — F17200 Nicotine dependence, unspecified, uncomplicated: Secondary | ICD-10-CM | POA: Insufficient documentation

## 2014-03-08 LAB — PULMONARY FUNCTION TEST
DL/VA % PRED: 90 %
DL/VA: 3.84 ml/min/mmHg/L
DLCO UNC % PRED: 71 %
DLCO cor % pred: 71 %
DLCO cor: 18.22 ml/min/mmHg
DLCO unc: 18.22 ml/min/mmHg
FEF 25-75 Post: 3.12 L/sec
FEF 25-75 Pre: 2.12 L/sec
FEF2575-%Change-Post: 47 %
FEF2575-%PRED-POST: 147 %
FEF2575-%Pred-Pre: 100 %
FEV1-%Change-Post: 14 %
FEV1-%PRED-POST: 90 %
FEV1-%Pred-Pre: 78 %
FEV1-POST: 2.45 L
FEV1-Pre: 2.14 L
FEV1FVC-%CHANGE-POST: 5 %
FEV1FVC-%Pred-Pre: 108 %
FEV6-%Change-Post: 8 %
FEV6-%PRED-PRE: 77 %
FEV6-%Pred-Post: 83 %
FEV6-Post: 2.89 L
FEV6-Pre: 2.68 L
FEV6FVC-%PRED-POST: 106 %
FEV6FVC-%Pred-Pre: 106 %
FVC-%Change-Post: 8 %
FVC-%Pred-Post: 78 %
FVC-%Pred-Pre: 72 %
FVC-PRE: 2.68 L
FVC-Post: 2.89 L
POST FEV1/FVC RATIO: 85 %
PRE FEV1/FVC RATIO: 80 %
PRE FEV6/FVC RATIO: 100 %
Post FEV6/FVC ratio: 100 %
RV % PRED: 104 %
RV: 2.22 L
TLC % pred: 81 %
TLC: 4.9 L

## 2014-03-08 LAB — GLUCOSE, CAPILLARY: GLUCOSE-CAPILLARY: 117 mg/dL — AB (ref 70–99)

## 2014-03-08 MED ORDER — ALBUTEROL SULFATE (2.5 MG/3ML) 0.083% IN NEBU
2.5000 mg | INHALATION_SOLUTION | Freq: Once | RESPIRATORY_TRACT | Status: AC
  Administered 2014-03-08: 2.5 mg via RESPIRATORY_TRACT

## 2014-03-08 MED ORDER — FLUDEOXYGLUCOSE F - 18 (FDG) INJECTION: 8.7500 | Freq: Once | INTRAVENOUS | Status: DC | PRN

## 2014-03-08 NOTE — Progress Notes (Signed)
Subjective:    Patient ID: Wayne [name], male    DOB: [birth year], 68 y.o.   MRN: [id number] PCP Philis Fendt, MD   HPI  IOV 03/03/2014   Chief Complaint  Patient presents with  . Pulmonary Consult    Referred by Dr. Einar Gip for lung nodules.    68 year old male accompanied by his grandson. He is a very poor historian. History is gained by referring physician cardiologist Dr. Einar Gip notes and the grandson. Patient is a smoker with coronary artery disease.  In October 2014 review of the charts indicate that his primary care physician did a CT scan of the chest based on a chest x-ray suggesting pulmonary nodule. The CT scan of the chest showed a right lower lobe noncalcified smooth 6.5 mm nodule. Patient is unaware of this. In vegetables and 15 he had a fall from a ladder and this was noted in the [facility] CT protocol that showed an increase in this nodule 8.5 mm right lower lobe smooth nodule. Therefore he is being referred. Patient also denies any baseline dyspnea, cough. Prior to the fall he was extremely active mowing the yard. Since the fall he continues to have muscular skeletal pain although the body.  His coronary artery disease and according to the referral notes get a normal ejection fraction of 50% in May 2013 and appears that he had a nuclear cardiac stress test probably April 2008 that was normal the patient and his grandson and says that cardiologist has ruled out active coronary artery disease  03/08/2014 Follow up RLL lung nodule Pt is a smoker previously referred for lung nodule found on CT chest . Initial CT showed a 7 mm lung nodule in 04/2013 and CT 01/2014 showed increase in size at 8.52mm. He was set up for PFT and PET scan.  PFT 03/08/2014 showed FEV1 78%, ratio 80, +BD response, Mildly decreased DLCO 71%.  03/08/14 PET showed a RLL pulmonary nodule not sign hypermetablolic, but at low end of resolution of PET , did enlarge since 04/27/13 . Left sided rib fx were noted-new.   We discussed these test in detail w/ pt and granddaughter.  We discussed INDI blood test for lung nodule. He was in agreement. Discussed smoking cessation he has no interest. Previously on Spiriva but does not want to take inhalers.  Says breathing overall is okay . Has daily cough . No hemoptysis, wt loss, chest pain,orthopnea , dyspnea.        Review of Systems  Constitutional: Negative for fever and unexpected weight change.  HENT: Negative for congestion, dental problem, ear pain, nosebleeds, postnasal drip, rhinorrhea, sinus pressure, sneezing, sore throat and trouble swallowing.   Eyes: Negative for redness and itching.  Respiratory: Positive for cough and shortness of breath. Negative for chest tightness and wheezing.   Cardiovascular: neg CP Gastrointestinal: Negative for nausea and vomiting.  Genitourinary: Negative for dysuria.  Musculoskeletal: Negative for joint swelling.  Skin: Negative for rash.  Neurological: Negative for headaches.  Hematological: Does not bruise/bleed easily.  Psychiatric/Behavioral: Negative for dysphoric mood. The patient is not nervous/anxious.        Objective:   Physical Exam  Nursing note and vitals reviewed. Constitutional: He is oriented to person, place, and time. He appears well-developed and well-nourished. No distress.  HENT:  Head: Normocephalic and atraumatic.  Right Ear: External ear normal.  Left Ear: External ear normal.  Mouth/Throat: Oropharynx is clear and moist. No oropharyngeal exudate.  Eyes: Conjunctivae and EOM are  normal. Pupils are equal, round, and reactive to light. Right eye exhibits no discharge. Left eye exhibits no discharge. No scleral icterus.  Neck: Normal range of motion. Neck supple. No JVD present. No tracheal deviation present. No thyromegaly present.  Cardiovascular: Normal rate, regular rhythm and intact distal pulses.  Exam reveals no gallop and no friction rub.   No murmur heard. Pulmonary/Chest:  Effort normal and breath sounds normal. No respiratory distress. He has no wheezes. He has no rales. He exhibits no tenderness.  Scar central chest  Abdominal: Soft. Bowel sounds are normal. He exhibits no distension and no mass. There is no tenderness. There is no rebound and no guarding.  Musculoskeletal: Normal range of motion. He exhibits no edema and no tenderness.  Lymphadenopathy:    He has no cervical adenopathy.  Neurological: He is alert and oriented to person, place, and time. He has normal reflexes. No cranial nerve deficit. Coordination normal.  Skin: Skin is warm and dry. No rash noted. He is not diaphoretic. No erythema. No pallor.  Psychiatric: He has a normal mood and affect. His behavior is normal. Judgment and thought content normal.

## 2014-03-08 NOTE — Patient Instructions (Signed)
We are setting you up for a special blood test for the lung nodule INDI test .  Continue on current regimen  MOST IMPORTANT GOAL IS TO QUIT SMOKING  Follow up Dr. Chase Caller in 6 weeks and. As needed   Once the blood test is back we will decide on next CT scan timing.

## 2014-03-08 NOTE — Assessment & Plan Note (Addendum)
Initial CT showed a 7 mm lung nodule in 04/2013 and CT 01/2014 showed increase in size at 8.53mm. He was set up for PFT and PET scan.  PFT 03/08/2014 showed FEV1 78%, ratio 80, +BD response, Mildly decreased DLCO 71%.  03/08/14 PET showed a RLL pulmonary nodule not sign hypermetablolic, but at low end of resolution of PET , did enlarge since 04/27/13 . Left sided rib fx were noted-new.  >for now check INDI lab , determine if CT in 3 mon vs 6 mon  Too small for bx at this time , if growth on repeat CT consider bx vs surgical referral   We are setting you up for a special blood test for the lung nodule INDI test .  Continue on current regimen  MOST IMPORTANT GOAL IS TO QUIT SMOKING  Follow up Dr. Chase Caller in 6 weeks and. As needed   Once the blood test is back we will decide on next CT scan timing.

## 2014-03-08 NOTE — Telephone Encounter (Signed)
Wayne [name]  As discussed the nodule has grown at 8.29mmm but PET is negative which could be false negatie due to size of nodule being < 1cm. Atleast if this is cancer is stage 1A.  Please have a blood test called INDI done; apparently per Norton Blizzard our office should be set up for this. A form needs to be filled. The phlebotomist will call the patient over next day or so and set up a time to draw blood most likely at patient home. [place] [facility] could potentially have more info on this  Thanks  Dr. Brand Males, M.D., [facility].C.P Pulmonary and Critical Care Medicine Staff Physician [place] [facility] Pager: [phone], If no answer or between  15:00h - 7:00h: call [phone]  03/08/2014 2:16 PM       Nm Pet Image Initial (pi) Skull Base To Thigh  03/08/2014   CLINICAL DATA:  Initial treatment strategy for right-sided pulmonary nodule.  EXAM: NUCLEAR MEDICINE PET SKULL BASE TO THIGH  TECHNIQUE: 8.8 mCi F-18 FDG was injected intravenously. Full-ring PET imaging was performed from the skull base to thigh after the radiotracer. CT data was obtained and used for attenuation correction and anatomic localization.  FASTING BLOOD GLUCOSE:  Value: 117 mg/dl  COMPARISON:  Chest abdomen and pelvic CTs of 01/19/2014. No prior PET.  FINDINGS: NECK  No areas of abnormal hypermetabolism.  CHEST  The right lower lobe pulmonary nodule is without hypermetabolic correlate. Measures 8 mm on image 34 of series 6. 7 mm on 04/27/2013.  ABDOMEN/PELVIS  No areas of abnormal hypermetabolism.  SKELETON  Degenerative arthropathy involves the bilateral rotator cuffs. Numerous foci of hypermetabolism corresponding to healing posterior and posterior lateral left rib fractures. These are new since 01/19/2014. Example posterior fifth through ninth left ribs. Lateral third through sixth and eighth left ribs.  Right greater trochanteric bursitis.  CT IMAGES PERFORMED FOR ATTENUATION  CORRECTION  No significant findings within the neck. Carotid atherosclerosis. Median sternotomy. Mild cardiomegaly. Low-density bilateral renal lesions which are likely cysts. Renal vascular calcifications. Cholelithiasis.  IMPRESSION: 1. Right lower lobe pulmonary nodule is not significantly hypermetabolic, but is at the low end of resolution of PET. Given apparent interval enlargement since 04/27/2013, followup with chest CT at 3 months is recommended. 2. Interval development of multiple left-sided rib fractures. 3. Incidental findings, including cholelithiasis.   Electronically Signed   By: Abigail Miyamoto M.D.   On: 03/08/2014 12:02

## 2014-04-11 ENCOUNTER — Telehealth: Payer: Self-pay | Admitting: Internal Medicine

## 2014-04-11 DIAGNOSIS — R911 Solitary pulmonary nodule: Secondary | ICD-10-CM

## 2014-04-11 NOTE — Telephone Encounter (Signed)
Let him know blood test for lung nodule probability for cancer is INDETERMINATE (done 03/15/14); this means we still do not know if the spot is cancer or not.   For now tell him we will do followup: order end 3rd or 4th  week of nov 2015 for CT chest wo contrast (super D protocol) - 3 months from his PET scan dated 03/08/14  Chagne his oct appt to November after CT chest   Thanks  Dr. Brand Males, M.D., [facility].C.P Pulmonary and Critical Care Medicine Staff Physician [place] [facility] Pager: [phone], If no answer or between  15:00h - 7:00h: call [phone]  04/11/2014 9:27 PM    PS: based on fu CT will ordr PERCEPTA test

## 2014-04-12 NOTE — Telephone Encounter (Signed)
Called and spoke to pt. Pt aware that f/u in Oct has been rescheduled till Nov for after the CT. Pt aware that someone will contact him to schedule CT in Nov. Order placed. Nothing further needed.

## 2014-04-15 ENCOUNTER — Telehealth: Payer: Self-pay | Admitting: Internal Medicine

## 2014-04-15 NOTE — Telephone Encounter (Signed)
Spoke with the pt's grand daughter Lawerance Bach She states that they are confused b/c they thought that the appt was supposed to be for Oct and not Nov  I explained that MR wanted to change this to 3rd wk Nov b/c this is when the CT Chest is due  She verbalized understanding  The CT has not been set up yet  She requests that when we schedule this we need to call her or [name] to give appt date and time since the pt gets confused  Will forward to [facility] so they are aware

## 2014-04-16 NOTE — Telephone Encounter (Signed)
Contacted Mr. Fowler and scheduled CT Chest w/o contrast on Tues 05/25/14 at 10:30 at [facility]. Pt to arrive at 10:15. No prep. Mr. Vickki Muff given address of [address], [address]. Advised Mr. Vickki Muff of follow up appointment with Dr. Chase Caller for 06/07/14 at 10:15. Mr. Vickki Muff voiced understanding of both appointments. Nothing else needed at this time. Rhonda J Cobb

## 2014-04-16 NOTE — Telephone Encounter (Signed)
Per phone note call Macarthur Critchley to schedule after 3:30 at [phone]. Will contact Mr. Vickki Muff then to schedule CT Chest 3rd week in Nov with ov to follow with Dr. Chase Caller. Rhonda J Cobb

## 2014-04-19 ENCOUNTER — Ambulatory Visit: Payer: Medicare Other | Admitting: Internal Medicine

## 2014-04-23 ENCOUNTER — Encounter: Payer: Self-pay | Admitting: Internal Medicine

## 2014-05-25 ENCOUNTER — Ambulatory Visit (INDEPENDENT_AMBULATORY_CARE_PROVIDER_SITE_OTHER)
Admission: RE | Admit: 2014-05-25 | Discharge: 2014-05-25 | Disposition: A | Discharge status: Home / Self Care | Payer: Medicare Other | Source: Ambulatory Visit | Attending: Internal Medicine | Admitting: Internal Medicine

## 2014-05-25 DIAGNOSIS — R911 Solitary pulmonary nodule: Secondary | ICD-10-CM

## 2014-05-26 ENCOUNTER — Telehealth: Payer: Self-pay | Admitting: Internal Medicine

## 2014-05-26 NOTE — Telephone Encounter (Signed)
  Wayne [name] He has growing nodule that is indeterminate on PET (unchanged frmo most recent but grown over time) ,INDI blood test also indeterminate. [name] His current nodule is 34mm. I think he might good for PERCEPTA v PAULA test. Thoughts? His current CT is a super-D. Can you take a look?   Do you want me to have him see you instead of me 06/07/14 to discuss further testing for nodule?  Thanks  Dr. Brand Males, M.D., [facility].C.P Pulmonary and Critical Care Medicine Staff Physician [place] [facility] Pager: [phone], If no answer or between  15:00h - 7:00h: call [phone]  05/26/2014 6:26 AM

## 2014-06-07 ENCOUNTER — Other Ambulatory Visit: Payer: Medicare Other

## 2014-06-07 ENCOUNTER — Ambulatory Visit (INDEPENDENT_AMBULATORY_CARE_PROVIDER_SITE_OTHER): Payer: Medicare Other | Admitting: Internal Medicine

## 2014-06-07 ENCOUNTER — Encounter: Payer: Self-pay | Admitting: Internal Medicine

## 2014-06-07 VITALS — BP 146/88 | HR 73 | Ht 68.0 in | Wt 183.0 lb

## 2014-06-07 DIAGNOSIS — F17200 Nicotine dependence, unspecified, uncomplicated: Secondary | ICD-10-CM

## 2014-06-07 DIAGNOSIS — R911 Solitary pulmonary nodule: Secondary | ICD-10-CM

## 2014-06-07 DIAGNOSIS — Z720 Tobacco use: Secondary | ICD-10-CM

## 2014-06-07 DIAGNOSIS — J449 Chronic obstructive pulmonary disease, unspecified: Secondary | ICD-10-CM

## 2014-06-07 NOTE — Progress Notes (Signed)
Subjective:    Patient ID: Wayne [name], male    DOB: [birth year], 68 y.o.   MRN: [id number]  HPI   PCP Philis Fendt, MD   HPI  IOV 03/03/2014   Chief Complaint  Patient presents with  . Pulmonary Consult    Referred by Dr. Einar Gip for lung nodules.    69 year old male accompanied by his grandson. He is a very poor historian. History is gained by referring physician cardiologist Dr. Einar Gip notes and the grandson. Patient is a smoker with coronary artery disease.  In October 2014 review of the charts indicate that his primary care physician did a CT scan of the chest based on a chest x-ray suggesting pulmonary nodule. The CT scan of the chest showed a right lower lobe noncalcified smooth 6.5 mm nodule. Patient is unaware of this. In vegetables and 15 he had a fall from a ladder and this was noted in the trauma [place] CT protocol that showed an increase in this nodule 8.5 mm right lower lobe smooth nodule. Therefore he is being referred. Patient also denies any baseline dyspnea, cough. Prior to the fall he was extremely active mowing the yard. Since the fall he continues to have muscular skeletal pain although the body.  His coronary artery disease and according to the referral notes get a normal ejection fraction of 50% in May 2013 and appears that he had a nuclear cardiac stress test probably April 2008 that was normal the patient and his grandson and says that cardiologist has ruled out active coronary artery disease  03/08/2014 Follow up RLL lung nodule Pt is a smoker previously referred for lung nodule found on CT chest . Initial CT showed a 7 mm lung nodule in 04/2013 and CT 01/2014 showed increase in size at 8.68mm. He was set up for PFT and PET scan.  PFT 03/08/2014 showed FEV1 78%, ratio 80, +BD response, Mildly decreased DLCO 71%.  03/08/14 PET showed a RLL pulmonary nodule not sign hypermetablolic, but at low end of resolution of PET , did enlarge since 04/27/13 . Left sided rib fx were  noted-new.  We discussed these test in detail w/ pt and granddaughter.  We discussed INDI blood test for lung nodule. He was in agreement. Discussed smoking cessation he has no interest. Previously on Spiriva but does not want to take inhalers.  Says breathing overall is okay . Has daily cough . No hemoptysis, wt loss, chest pain,orthopnea , dyspnea.      OV 06/07/2014  Chief Complaint  Patient presents with  . Follow-up    Pt states his breathing is unchanged since last OV. Pt c/o intermittent dry cough. Pt denies CP/tightness and SOB.    FU Gld stabge 2 copd (not interested in inhalers), Lung nodule 92mm RLL since oct 2014 and Smoking. [facility] with [facility]   Lung nodule - 70mm RLL diagnosed Oct 2014  - 03/15/14  INDI Blood test  - lung nodule probability for cancer is INDETERMINATE (done 03/15/14)  -  CT Super D 05/25/14 -  47mm RLL nodule unchnaged from Juluy 2015 and OCt 2014 - 1 year Stability +  Smoking:  - still does. REfused to quit.  COPD moderate  - mild to moderate symptoms +. Refuses inhalers. No hx of ER , urgen care visits or exacerbation    Past, Family, Social reviewed: no change since last visit   Immunization History  Administered Date(s) Administered  . Influenza Split 04/22/2014  . Influenza Whole 05/31/2009, 07/11/2010  .  Pneumococcal Polysaccharide-23 05/09/2005  . Pneumococcal-Unspecified 11/13/2011  . Td 05/31/2009  . Zoster 01/06/2013       Review of Systems  Constitutional: Negative for fever and unexpected weight change.  HENT: Positive for congestion. Negative for dental problem, ear pain, nosebleeds, postnasal drip, rhinorrhea, sinus pressure, sneezing, sore throat and trouble swallowing.   Eyes: Negative for redness and itching.  Respiratory: Positive for cough. Negative for chest tightness, shortness of breath and wheezing.   Cardiovascular: Negative for palpitations and leg swelling.  Gastrointestinal: Negative for nausea and vomiting.    Genitourinary: Negative for dysuria.  Musculoskeletal: Negative for joint swelling.  Skin: Negative for rash.  Neurological: Negative for headaches.  Hematological: Does not bruise/bleed easily.  Psychiatric/Behavioral: Negative for dysphoric mood. The patient is not nervous/anxious.        Objective:   Physical Exam  Constitutional: He is oriented to person, place, and time. He appears well-developed and well-nourished. No distress.  Mildly disheveled  HENT:  Head: Normocephalic and atraumatic.  Right Ear: External ear normal.  Left Ear: External ear normal.  Mouth/Throat: Oropharynx is clear and moist. No oropharyngeal exudate.  Eyes: Conjunctivae and EOM are normal. Pupils are equal, round, and reactive to light. Right eye exhibits no discharge. Left eye exhibits no discharge. No scleral icterus.  Neck: Normal range of motion. Neck supple. No JVD present. No tracheal deviation present. No thyromegaly present.  Cardiovascular: Normal rate, regular rhythm and intact distal pulses.  Exam reveals no gallop and no friction rub.   No murmur heard. Pulmonary/Chest: Effort normal and breath sounds normal. No respiratory distress. He has no wheezes. He has no rales. He exhibits no tenderness.  Abdominal: Soft. Bowel sounds are normal. He exhibits no distension and no mass. There is no tenderness. There is no rebound and no guarding.  Musculoskeletal: Normal range of motion. He exhibits no edema or tenderness.  Lymphadenopathy:    He has no cervical adenopathy.  Neurological: He is alert and oriented to person, place, and time. He has normal reflexes. No cranial nerve deficit. Coordination normal.  Skin: Skin is warm and dry. No rash noted. He is not diaphoretic. No erythema. No pallor.  Psychiatric: He has a normal mood and affect. His behavior is normal. Judgment and thought content normal.  Poor historian  Nursing note and vitals reviewed.   Filed Vitals:   06/07/14 1038  BP: 146/88   Pulse: 73  Height: 5\' 8"  (1.727 m)  Weight: 183 lb (83.008 kg)  SpO2: 98%          Assessment & Plan:     ICD-9-CM ICD-10-CM   1. Nodule of right lung 793.11 R91.1   2. COPD, moderate 496 J44.9   3. Smoking 305.1 Z72.0      #Right lung nodule  - stable 28mm Nodule Oct 2014 through Nov 2015  - repeat CT chest wo contrast super-d protocol in 6 months  #Moderate COPD  - stable disease  -respect your right to refuse inhaler therapy - do alpha 1 genetic test for copd today  #SMoking  - advise to quit but respect your desire to continue smoking  #Followup  6 months fu for lung nodule after CT chest - return to see Tammy PArrett My NP   Dr. Brand Males, M.D., [facility].C.P Pulmonary and Critical Care Medicine Staff Physician [place] [facility] Pager: [phone], If no answer or between  15:00h - 7:00h: call [phone]  06/07/2014 10:56 AM

## 2014-06-07 NOTE — Patient Instructions (Addendum)
#  Right lung nodule  - stable 43mm Nodule Oct 2014 through Nov 2015  - repeat CT chest wo contrast super-d protocol in 6 months  #Moderate COPD  - stable disease  -respect your right to refuse inhaler therapy - do alpha 1 genetic test for copd today  #SMoking  - advise to quit but respect your desire to continue smoking  #Followup  6 months fu for lung nodule after CT chest - return to see Tammy PArrett My NP

## 2014-06-07 NOTE — Telephone Encounter (Signed)
Thanks a lot Rob. Will close encounter

## 2014-06-07 NOTE — Telephone Encounter (Signed)
MR - saw your notes from patient's office visit today. Agree with following his CT in 6 months as a super D. If we do decide to proceed with bronchoscopy then I would perform Percepta at that time. PAULA test might be reasonable to do now, although his other predictive tests have been indeterminate. I'll be happy to assist with navigational bronchoscopy if indicated in the future. RB

## 2014-06-10 LAB — ALPHA-1 ANTITRYPSIN PHENOTYPE: A1 ANTITRYPSIN: 175 mg/dL (ref 83–199)

## 2014-06-24 NOTE — Progress Notes (Signed)
Quick Note:  Called and spoke to pt. Informed pt of the results per MR. Pt verbalized understanding and denied any further questions or concerns at this time. ______ 

## 2014-07-07 ENCOUNTER — Encounter: Payer: Self-pay | Admitting: Diagnostic Neuroimaging

## 2014-07-07 ENCOUNTER — Ambulatory Visit (INDEPENDENT_AMBULATORY_CARE_PROVIDER_SITE_OTHER): Payer: Medicare Other | Admitting: Diagnostic Neuroimaging

## 2014-07-07 VITALS — BP 158/88 | HR 62 | Temp 98.2°F | Ht 69.5 in | Wt 178.2 lb

## 2014-07-07 DIAGNOSIS — F03B0 Unspecified dementia, moderate, without behavioral disturbance, psychotic disturbance, mood disturbance, and anxiety: Secondary | ICD-10-CM

## 2014-07-07 DIAGNOSIS — F0390 Unspecified dementia without behavioral disturbance: Secondary | ICD-10-CM

## 2014-07-07 NOTE — Patient Instructions (Signed)
Continue donepezil 
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
## 2014-07-16 ENCOUNTER — Other Ambulatory Visit: Payer: Self-pay | Admitting: Internal Medicine

## 2014-07-16 DIAGNOSIS — R0600 Dyspnea, unspecified: Secondary | ICD-10-CM

## 2014-07-19 ENCOUNTER — Ambulatory Visit: Payer: Medicare Other | Admitting: Internal Medicine

## 2014-08-16 ENCOUNTER — Emergency Department (HOSPITAL_COMMUNITY): Payer: Medicare Other

## 2014-08-16 ENCOUNTER — Encounter (HOSPITAL_COMMUNITY): Payer: Self-pay | Admitting: Emergency Medicine

## 2014-08-16 ENCOUNTER — Emergency Department (HOSPITAL_COMMUNITY)
Admission: EM | Admit: 2014-08-16 | Discharge: 2014-08-16 | Disposition: A | Discharge status: Home / Self Care | Payer: Medicare Other | Source: Home / Self Care | Attending: Emergency Medicine | Admitting: Emergency Medicine

## 2014-08-16 DIAGNOSIS — I252 Old myocardial infarction: Secondary | ICD-10-CM | POA: Insufficient documentation

## 2014-08-16 DIAGNOSIS — I2510 Atherosclerotic heart disease of native coronary artery without angina pectoris: Secondary | ICD-10-CM | POA: Insufficient documentation

## 2014-08-16 DIAGNOSIS — Z9861 Coronary angioplasty status: Secondary | ICD-10-CM | POA: Insufficient documentation

## 2014-08-16 DIAGNOSIS — Z8669 Personal history of other diseases of the nervous system and sense organs: Secondary | ICD-10-CM | POA: Insufficient documentation

## 2014-08-16 DIAGNOSIS — I10 Essential (primary) hypertension: Secondary | ICD-10-CM | POA: Insufficient documentation

## 2014-08-16 DIAGNOSIS — I639 Cerebral infarction, unspecified: Secondary | ICD-10-CM | POA: Diagnosis not present

## 2014-08-16 DIAGNOSIS — Y998 Other external cause status: Secondary | ICD-10-CM | POA: Insufficient documentation

## 2014-08-16 DIAGNOSIS — S4992XA Unspecified injury of left shoulder and upper arm, initial encounter: Secondary | ICD-10-CM | POA: Insufficient documentation

## 2014-08-16 DIAGNOSIS — E785 Hyperlipidemia, unspecified: Secondary | ICD-10-CM | POA: Insufficient documentation

## 2014-08-16 DIAGNOSIS — Y9389 Activity, other specified: Secondary | ICD-10-CM | POA: Insufficient documentation

## 2014-08-16 DIAGNOSIS — W010XXA Fall on same level from slipping, tripping and stumbling without subsequent striking against object, initial encounter: Secondary | ICD-10-CM | POA: Insufficient documentation

## 2014-08-16 DIAGNOSIS — Y9289 Other specified places as the place of occurrence of the external cause: Secondary | ICD-10-CM | POA: Insufficient documentation

## 2014-08-16 DIAGNOSIS — Z720 Tobacco use: Secondary | ICD-10-CM | POA: Insufficient documentation

## 2014-08-16 DIAGNOSIS — W19XXXA Unspecified fall, initial encounter: Secondary | ICD-10-CM

## 2014-08-16 DIAGNOSIS — I4891 Unspecified atrial fibrillation: Secondary | ICD-10-CM | POA: Insufficient documentation

## 2014-08-16 DIAGNOSIS — Z7951 Long term (current) use of inhaled steroids: Secondary | ICD-10-CM | POA: Insufficient documentation

## 2014-08-16 DIAGNOSIS — Z8739 Personal history of other diseases of the musculoskeletal system and connective tissue: Secondary | ICD-10-CM | POA: Insufficient documentation

## 2014-08-16 DIAGNOSIS — F329 Major depressive disorder, single episode, unspecified: Secondary | ICD-10-CM | POA: Insufficient documentation

## 2014-08-16 DIAGNOSIS — Z79899 Other long term (current) drug therapy: Secondary | ICD-10-CM | POA: Insufficient documentation

## 2014-08-16 DIAGNOSIS — J449 Chronic obstructive pulmonary disease, unspecified: Secondary | ICD-10-CM | POA: Insufficient documentation

## 2014-08-16 DIAGNOSIS — Z872 Personal history of diseases of the skin and subcutaneous tissue: Secondary | ICD-10-CM | POA: Insufficient documentation

## 2014-08-16 DIAGNOSIS — E119 Type 2 diabetes mellitus without complications: Secondary | ICD-10-CM | POA: Insufficient documentation

## 2014-08-16 DIAGNOSIS — Z7901 Long term (current) use of anticoagulants: Secondary | ICD-10-CM | POA: Insufficient documentation

## 2014-08-16 DIAGNOSIS — M6289 Other specified disorders of muscle: Secondary | ICD-10-CM | POA: Diagnosis not present

## 2014-08-16 DIAGNOSIS — Z951 Presence of aortocoronary bypass graft: Secondary | ICD-10-CM | POA: Insufficient documentation

## 2014-08-16 MED ORDER — MORPHINE SULFATE 4 MG/ML IJ SOLN
6.0000 mg | Freq: Once | INTRAMUSCULAR | Status: AC
  Administered 2014-08-16: 6 mg via INTRAMUSCULAR
  Filled 2014-08-16: qty 2

## 2014-08-16 MED ORDER — HYDROCODONE-ACETAMINOPHEN 5-325 MG PO TABS: 1.0000 | ORAL_TABLET | Freq: Once | ORAL | Status: DC

## 2014-08-16 MED ORDER — OXYCODONE-ACETAMINOPHEN 5-325 MG PO TABS: 1.0000 | ORAL_TABLET | Freq: Four times a day (QID) | ORAL | Status: DC | PRN

## 2014-08-16 NOTE — Discharge Instructions (Signed)
Return here as needed. Follow up with the orthopedist provided.  °

## 2014-08-16 NOTE — ED Notes (Signed)
Pt states that he fell on Saturday.  Unsure of circumstances surrounding fall.  Grandson states pt has dementia and has been falling a lot.  C/o lt forearm pain.

## 2014-08-16 NOTE — ED Provider Notes (Signed)
CSN: [id number]     Arrival date & time 08/16/14  1200 History   First MD Initiated Contact with Patient 08/16/14 1405     Chief Complaint  Patient presents with  . Fall  . Arm Pain     (Consider location/radiation/quality/duration/timing/severity/associated sxs/prior Treatment) HPI  Patient presents complaining of left arm pain.  On Saturday, 08/14/14, patient was cleaning out a carport and tripped over something and fell forward.  He landed on an outstretched arm.  Patient is a poor historian.  He didn't think much of his arm when he fell, but has since been in quite a bit of pain.  Patient endorses slight nausea.  Denies headache, blurred vision, weakness, dizziness, back pain, neck pain, vomiting, diarrhea, abdominal pain or syncope.  Past Medical History  Diagnosis Date  . Chronic ischemic heart disease   . Atrial fibrillation   . Peripheral vascular disease   . Hypertension   . CAD (coronary artery disease)   . Hyperlipidemia   . Depression   . Actinic keratosis   . Degeneration of cervical intervertebral disc   . Osteoarthrosis, unspecified whether generalized or localized, shoulder region   . Other disorder of muscle, ligament, and fascia   . Carpal tunnel syndrome   . COPD (chronic obstructive pulmonary disease)   . Myocardial infarction   . Shortness of breath   . Diabetes mellitus     type 2   Past Surgical History  Procedure Laterality Date  . Coronary artery bypass graft    . Coronary angioplasty    . Carpal tunnel release    . Finger amputation     Family History  Problem Relation Age of Onset  . Diabetes Mother   . Pneumonia Father   . Gout Brother    History  Substance Use Topics  . Smoking status: Current Every Day Smoker -- 0.50 packs/day for 56 years    Types: Cigarettes  . Smokeless tobacco: Never Used  . Alcohol Use: No    Review of Systems  All other systems reviewed and are negative.  Level 5 caveat applies due to being a poor historian.     Allergies  Codeine  Home Medications   Prior to Admission medications   Medication Sig Start Date End Date Taking? Authorizing Provider  amLODipine (NORVASC) 5 MG tablet Take 5 mg by mouth daily.     Yes Historical Provider, MD  atorvastatin (LIPITOR) 80 MG tablet Take 80 mg by mouth daily.     Yes Historical Provider, MD  cloNIDine (CATAPRES) 0.1 MG tablet Take 0.1 mg by mouth daily.   Yes Historical Provider, MD  donepezil (ARICEPT) 5 MG tablet Take 1 tablet by mouth daily. 06/14/14  Yes Historical Provider, MD  DULoxetine (CYMBALTA) 30 MG capsule Take 60 mg by mouth daily.     Yes Historical Provider, MD  fluticasone (FLOVENT HFA) 110 MCG/ACT inhaler Inhale 2 puffs into the lungs daily as needed.    Yes Historical Provider, MD  gabapentin (NEURONTIN) 300 MG capsule Take 300 mg by mouth 2 (two) times daily.     Yes Historical Provider, MD  glimepiride (AMARYL) 2 MG tablet Take 2 mg by mouth daily.    Yes Historical Provider, MD  lisinopril (PRINIVIL,ZESTRIL) 5 MG tablet Take 5 mg by mouth daily.   Yes Historical Provider, MD  omega-3 acid ethyl esters (LOVAZA) 1 G capsule Take 1 g by mouth. 4 tablets by mouth daily    Yes Historical Provider, MD  pioglitazone (ACTOS) 15 MG tablet Take 15 mg by mouth daily.     Yes Historical Provider, MD  rivaroxaban (XARELTO) 20 MG TABS tablet Take 20 mg by mouth daily. Patient and family member states he had xarelto today per patient   Yes Historical Provider, MD   BP 173/101 mmHg  Pulse 91  Temp(Src) 98.6 F (37 C) (Oral)  Resp 18  SpO2 99% Physical Exam  Constitutional: He is oriented to person, place, and time. He appears well-developed and well-nourished.  HENT:  Head: Normocephalic and atraumatic.  Cardiovascular: Normal rate, regular rhythm and normal heart sounds.   Pulmonary/Chest: Effort normal and breath sounds normal. No respiratory distress.  Musculoskeletal:       Left shoulder: He exhibits decreased range of motion, tenderness  and pain. He exhibits no swelling, no effusion, no deformity, no laceration, normal pulse and normal strength.       Left elbow: He exhibits normal range of motion, no swelling, no deformity and no laceration. No tenderness found. No radial head and no olecranon process tenderness noted.       Left wrist: He exhibits normal range of motion, no tenderness, no bony tenderness, no swelling, no effusion and no laceration.       Left hand: Normal sensation noted. Normal strength noted.  Neurological: He is alert and oriented to person, place, and time. He exhibits normal muscle tone. Coordination normal.  Skin: Skin is warm and dry. No rash noted. No erythema.  Nursing note and vitals reviewed.   ED Course  Procedures (including critical care time)  Imaging Review Dg Forearm Left  08/16/2014   CLINICAL DATA:  Status post fall while moving furniture 08/14/2014. Left forearm pain. Initial encounter.  EXAM: LEFT FOREARM - 2 VIEW  COMPARISON:  None.  FINDINGS: No acute bony or joint abnormality is identified. Vascular calcifications are noted.  IMPRESSION: No acute finding.   Electronically Signed   By: Inge Rise M.D.   On: 08/16/2014 12:56   Dg Shoulder Left  08/16/2014   CLINICAL DATA:  Fall with left shoulder pain.  Initial encounter  EXAM: LEFT SHOULDER - 2+ VIEW  COMPARISON:  02/03/2014  FINDINGS: Advanced glenohumeral osteoarthritis with large marginal spurs and joint narrowing. There has been no progression since 2015. Chronic spurring and fragmentation of the acromioclavicular joint without new widening or malalignment.  No evidence of left chest wall trauma.  IMPRESSION: 1. No acute osseous findings. 2. Advanced acromioclavicular and glenohumeral osteoarthritis.   Electronically Signed   By: Jorje Guild M.D.   On: 08/16/2014 15:16      MDM   Final diagnoses:  Fall   Patient be given referral to orthopedics.  Told to return here as needed.  Told to use ice on his shoulder.   Patient's main area of pain is in the left shoulder.  Patient is advised to return here as needed.      Brent General, PA-C 08/16/14 [place], MD 08/17/14 [phone]

## 2014-08-19 ENCOUNTER — Emergency Department (HOSPITAL_COMMUNITY): Payer: Medicare Other

## 2014-08-19 ENCOUNTER — Observation Stay (HOSPITAL_COMMUNITY): Payer: Medicare Other

## 2014-08-19 ENCOUNTER — Inpatient Hospital Stay (HOSPITAL_COMMUNITY)
Admission: EM | Admit: 2014-08-19 | Discharge: 2014-08-20 | DRG: 065 | Disposition: A | Discharge status: Home Health | Payer: Medicare Other | Attending: Internal Medicine | Admitting: Internal Medicine

## 2014-08-19 ENCOUNTER — Encounter (HOSPITAL_COMMUNITY): Payer: Self-pay

## 2014-08-19 DIAGNOSIS — M1990 Unspecified osteoarthritis, unspecified site: Secondary | ICD-10-CM | POA: Diagnosis present

## 2014-08-19 DIAGNOSIS — M6289 Other specified disorders of muscle: Secondary | ICD-10-CM | POA: Diagnosis present

## 2014-08-19 DIAGNOSIS — I639 Cerebral infarction, unspecified: Secondary | ICD-10-CM | POA: Diagnosis present

## 2014-08-19 DIAGNOSIS — F319 Bipolar disorder, unspecified: Secondary | ICD-10-CM | POA: Diagnosis present

## 2014-08-19 DIAGNOSIS — R200 Anesthesia of skin: Secondary | ICD-10-CM | POA: Diagnosis not present

## 2014-08-19 DIAGNOSIS — F17210 Nicotine dependence, cigarettes, uncomplicated: Secondary | ICD-10-CM | POA: Diagnosis present

## 2014-08-19 DIAGNOSIS — E119 Type 2 diabetes mellitus without complications: Secondary | ICD-10-CM | POA: Diagnosis present

## 2014-08-19 DIAGNOSIS — I10 Essential (primary) hypertension: Secondary | ICD-10-CM | POA: Diagnosis present

## 2014-08-19 DIAGNOSIS — E785 Hyperlipidemia, unspecified: Secondary | ICD-10-CM | POA: Diagnosis present

## 2014-08-19 DIAGNOSIS — I252 Old myocardial infarction: Secondary | ICD-10-CM

## 2014-08-19 DIAGNOSIS — Z9861 Coronary angioplasty status: Secondary | ICD-10-CM

## 2014-08-19 DIAGNOSIS — IMO0001 Reserved for inherently not codable concepts without codable children: Secondary | ICD-10-CM | POA: Diagnosis present

## 2014-08-19 DIAGNOSIS — I4891 Unspecified atrial fibrillation: Secondary | ICD-10-CM | POA: Diagnosis present

## 2014-08-19 DIAGNOSIS — F0390 Unspecified dementia without behavioral disturbance: Secondary | ICD-10-CM | POA: Diagnosis present

## 2014-08-19 DIAGNOSIS — R911 Solitary pulmonary nodule: Secondary | ICD-10-CM

## 2014-08-19 DIAGNOSIS — G8191 Hemiplegia, unspecified affecting right dominant side: Secondary | ICD-10-CM | POA: Diagnosis present

## 2014-08-19 DIAGNOSIS — Z955 Presence of coronary angioplasty implant and graft: Secondary | ICD-10-CM

## 2014-08-19 DIAGNOSIS — Z7901 Long term (current) use of anticoagulants: Secondary | ICD-10-CM

## 2014-08-19 DIAGNOSIS — Z79899 Other long term (current) drug therapy: Secondary | ICD-10-CM | POA: Diagnosis not present

## 2014-08-19 DIAGNOSIS — I6330 Cerebral infarction due to thrombosis of unspecified cerebral artery: Secondary | ICD-10-CM | POA: Diagnosis not present

## 2014-08-19 DIAGNOSIS — F329 Major depressive disorder, single episode, unspecified: Secondary | ICD-10-CM | POA: Diagnosis present

## 2014-08-19 DIAGNOSIS — F17200 Nicotine dependence, unspecified, uncomplicated: Secondary | ICD-10-CM

## 2014-08-19 DIAGNOSIS — I739 Peripheral vascular disease, unspecified: Secondary | ICD-10-CM | POA: Diagnosis present

## 2014-08-19 DIAGNOSIS — I481 Persistent atrial fibrillation: Secondary | ICD-10-CM | POA: Diagnosis not present

## 2014-08-19 DIAGNOSIS — I2510 Atherosclerotic heart disease of native coronary artery without angina pectoris: Secondary | ICD-10-CM | POA: Diagnosis present

## 2014-08-19 DIAGNOSIS — M5030 Other cervical disc degeneration, unspecified cervical region: Secondary | ICD-10-CM

## 2014-08-19 DIAGNOSIS — I429 Cardiomyopathy, unspecified: Secondary | ICD-10-CM | POA: Diagnosis present

## 2014-08-19 DIAGNOSIS — I482 Chronic atrial fibrillation: Secondary | ICD-10-CM | POA: Diagnosis present

## 2014-08-19 DIAGNOSIS — J449 Chronic obstructive pulmonary disease, unspecified: Secondary | ICD-10-CM | POA: Diagnosis present

## 2014-08-19 DIAGNOSIS — D485 Neoplasm of uncertain behavior of skin: Secondary | ICD-10-CM

## 2014-08-19 DIAGNOSIS — R531 Weakness: Secondary | ICD-10-CM | POA: Diagnosis present

## 2014-08-19 DIAGNOSIS — Z951 Presence of aortocoronary bypass graft: Secondary | ICD-10-CM

## 2014-08-19 DIAGNOSIS — L570 Actinic keratosis: Secondary | ICD-10-CM

## 2014-08-19 LAB — RAPID URINE DRUG SCREEN, HOSP PERFORMED
Amphetamines: NOT DETECTED
Barbiturates: NOT DETECTED
Benzodiazepines: NOT DETECTED
COCAINE: NOT DETECTED
Opiates: NOT DETECTED
TETRAHYDROCANNABINOL: NOT DETECTED

## 2014-08-19 LAB — PROTIME-INR
INR: 1.1 (ref 0.00–1.49)
PROTHROMBIN TIME: 14.3 s (ref 11.6–15.2)

## 2014-08-19 LAB — CBC
HCT: 44.9 % (ref 39.0–52.0)
Hemoglobin: 15.6 g/dL (ref 13.0–17.0)
MCH: 32.2 pg (ref 26.0–34.0)
MCHC: 34.7 g/dL (ref 30.0–36.0)
MCV: 92.6 fL (ref 78.0–100.0)
Platelets: 157 10*3/uL (ref 150–400)
RBC: 4.85 MIL/uL (ref 4.22–5.81)
RDW: 13.1 % (ref 11.5–15.5)
WBC: 6.4 10*3/uL (ref 4.0–10.5)

## 2014-08-19 LAB — I-STAT TROPONIN, ED: Troponin i, poc: 0 ng/mL (ref 0.00–0.08)

## 2014-08-19 LAB — URINALYSIS, ROUTINE W REFLEX MICROSCOPIC
Bilirubin Urine: NEGATIVE
Glucose, UA: NEGATIVE mg/dL
Hgb urine dipstick: NEGATIVE
Ketones, ur: NEGATIVE mg/dL
LEUKOCYTES UA: NEGATIVE
NITRITE: NEGATIVE
Protein, ur: NEGATIVE mg/dL
SPECIFIC GRAVITY, URINE: 1.017 (ref 1.005–1.030)
UROBILINOGEN UA: 2 mg/dL — AB (ref 0.0–1.0)
pH: 8 (ref 5.0–8.0)

## 2014-08-19 LAB — BASIC METABOLIC PANEL
ANION GAP: 12 (ref 5–15)
BUN: 12 mg/dL (ref 6–23)
CHLORIDE: 102 mmol/L (ref 96–112)
CO2: 24 mmol/L (ref 19–32)
CREATININE: 0.72 mg/dL (ref 0.50–1.35)
Calcium: 9.1 mg/dL (ref 8.4–10.5)
GFR calc Af Amer: >90 mL/min (ref >90)
GFR calc non Af Amer: >90 mL/min (ref >90)
Glucose, Bld: 140 mg/dL — ABNORMAL HIGH (ref 70–99)
Potassium: 3.9 mmol/L (ref 3.5–5.1)
Sodium: 138 mmol/L (ref 135–145)

## 2014-08-19 LAB — GLUCOSE, CAPILLARY: Glucose-Capillary: 173 mg/dL — ABNORMAL HIGH (ref 70–99)

## 2014-08-19 MED ORDER — SENNOSIDES-DOCUSATE SODIUM 8.6-50 MG PO TABS: 1.0000 | ORAL_TABLET | Freq: Every evening | ORAL | Status: DC | PRN

## 2014-08-19 MED ORDER — NICOTINE 14 MG/24HR TD PT24
14.0000 mg | MEDICATED_PATCH | Freq: Every day | TRANSDERMAL | Status: DC
  Administered 2014-08-19 – 2014-08-20 (×2): 14 mg via TRANSDERMAL
  Filled 2014-08-19 (×2): qty 1

## 2014-08-19 MED ORDER — FLUTICASONE PROPIONATE HFA 110 MCG/ACT IN AERO
2.0000 | INHALATION_SPRAY | Freq: Two times a day (BID) | RESPIRATORY_TRACT | Status: DC
  Filled 2014-08-19: qty 12

## 2014-08-19 MED ORDER — OMEGA-3-ACID ETHYL ESTERS 1 G PO CAPS
2.0000 g | ORAL_CAPSULE | Freq: Two times a day (BID) | ORAL | Status: DC
  Administered 2014-08-19 – 2014-08-20 (×2): 2 g via ORAL
  Filled 2014-08-19 (×2): qty 2

## 2014-08-19 MED ORDER — DONEPEZIL HCL 5 MG PO TABS
5.0000 mg | ORAL_TABLET | Freq: Every day | ORAL | Status: DC
  Administered 2014-08-20: 5 mg via ORAL
  Filled 2014-08-19: qty 1

## 2014-08-19 MED ORDER — INSULIN ASPART 100 UNIT/ML ~~LOC~~ SOLN
0.0000 [IU] | Freq: Three times a day (TID) | SUBCUTANEOUS | Status: DC
  Administered 2014-08-20: 2 [IU] via SUBCUTANEOUS

## 2014-08-19 MED ORDER — CLONIDINE HCL 0.1 MG PO TABS
0.1000 mg | ORAL_TABLET | Freq: Every day | ORAL | Status: DC
  Administered 2014-08-20: 0.1 mg via ORAL
  Filled 2014-08-19: qty 1

## 2014-08-19 MED ORDER — STROKE: EARLY STAGES OF RECOVERY BOOK
Freq: Once | Status: DC
  Filled 2014-08-19: qty 1

## 2014-08-19 MED ORDER — OXYCODONE-ACETAMINOPHEN 5-325 MG PO TABS
1.0000 | ORAL_TABLET | Freq: Four times a day (QID) | ORAL | Status: DC | PRN
  Filled 2014-08-19: qty 1

## 2014-08-19 MED ORDER — TIOTROPIUM BROMIDE MONOHYDRATE 18 MCG IN CAPS
18.0000 ug | ORAL_CAPSULE | Freq: Every day | RESPIRATORY_TRACT | Status: DC
  Filled 2014-08-19: qty 5

## 2014-08-19 MED ORDER — LATANOPROST 0.005 % OP SOLN
1.0000 [drp] | Freq: Every day | OPHTHALMIC | Status: DC
  Administered 2014-08-19: 1 [drp] via OPHTHALMIC
  Filled 2014-08-19: qty 2.5

## 2014-08-19 MED ORDER — DONEPEZIL HCL 5 MG PO TABS: 5.0000 mg | ORAL_TABLET | Freq: Every day | ORAL | Status: DC

## 2014-08-19 MED ORDER — DULOXETINE HCL 60 MG PO CPEP
60.0000 mg | ORAL_CAPSULE | Freq: Every day | ORAL | Status: DC
  Administered 2014-08-20: 60 mg via ORAL
  Filled 2014-08-19: qty 1

## 2014-08-19 MED ORDER — INSULIN ASPART 100 UNIT/ML ~~LOC~~ SOLN
3.0000 [IU] | Freq: Three times a day (TID) | SUBCUTANEOUS | Status: DC
  Administered 2014-08-20: 3 [IU] via SUBCUTANEOUS

## 2014-08-19 MED ORDER — RIVAROXABAN 20 MG PO TABS
20.0000 mg | ORAL_TABLET | Freq: Every day | ORAL | Status: DC
  Administered 2014-08-19: 20 mg via ORAL
  Filled 2014-08-19: qty 1

## 2014-08-19 MED ORDER — LISINOPRIL 5 MG PO TABS
5.0000 mg | ORAL_TABLET | Freq: Every day | ORAL | Status: DC
  Administered 2014-08-20: 5 mg via ORAL
  Filled 2014-08-19: qty 1

## 2014-08-19 MED ORDER — INSULIN GLARGINE 100 UNIT/ML ~~LOC~~ SOLN
5.0000 [IU] | Freq: Every day | SUBCUTANEOUS | Status: DC
Start: 2014-08-19 — End: 2014-08-20
  Administered 2014-08-19: 5 [IU] via SUBCUTANEOUS
  Filled 2014-08-19 (×2): qty 0.05

## 2014-08-19 MED ORDER — INSULIN ASPART 100 UNIT/ML ~~LOC~~ SOLN: 0.0000 [IU] | Freq: Every day | SUBCUTANEOUS | Status: DC

## 2014-08-19 MED ORDER — ATORVASTATIN CALCIUM 80 MG PO TABS
80.0000 mg | ORAL_TABLET | Freq: Every day | ORAL | Status: DC
  Administered 2014-08-19: 80 mg via ORAL
  Filled 2014-08-19: qty 2

## 2014-08-19 NOTE — Progress Notes (Signed)
Pt is admitted to 4N01 from ED. Admission VS are stable

## 2014-08-19 NOTE — Consult Note (Signed)
Referring Physician: Mingo Amber    Chief Complaint: right sided numbness  HPI:                                                                                                                                         Wayne [name] is an 69 y.o. male who smokes 1/2 pk per day, has Afib on Xarelto, diabetes and HTN.  Patient went to sleep 08/17/2014 and when he awoke the next morning at 0800 he states he fell out of his bed.  He noted he was weak in the right leg and felt " he had no circulation in the right face, arm and leg and this area just felt funny".  Patient was brought to the ED by family for further evaluation.  Currently he is no distress, but endorses decreased sensation in the right arm and lag.  HE states he has taken his Xarelto as prescribed.    Date last known well: Date: 08/17/2014 Time last known well: Unable to determine tPA Given: No: out of window  Past Medical History  Diagnosis Date  . Chronic ischemic heart disease   . Atrial fibrillation   . Peripheral vascular disease   . Hypertension   . CAD (coronary artery disease)   . Hyperlipidemia   . Depression   . Actinic keratosis   . Degeneration of cervical intervertebral disc   . Osteoarthrosis, unspecified whether generalized or localized, shoulder region   . Other disorder of muscle, ligament, and fascia   . Carpal tunnel syndrome   . COPD (chronic obstructive pulmonary disease)   . Myocardial infarction   . Shortness of breath   . Diabetes mellitus     type 2    Past Surgical History  Procedure Laterality Date  . Coronary artery bypass graft    . Coronary angioplasty    . Carpal tunnel release    . Finger amputation      Family History  Problem Relation Age of Onset  . Diabetes Mother   . Pneumonia Father   . Gout Brother    Social History:  reports that he has been smoking Cigarettes.  He has a 28 pack-year smoking history. He has never used smokeless tobacco. He reports that he does not drink alcohol or  use illicit drugs.  Allergies:  Allergies  Allergen Reactions  . Codeine Itching    Medications:  No current facility-administered medications for this encounter.   Current Outpatient Prescriptions  Medication Sig Dispense Refill  . amLODipine (NORVASC) 5 MG tablet Take 5 mg by mouth daily.      [name] atorvastatin (LIPITOR) 80 MG tablet Take 80 mg by mouth daily.      . cloNIDine (CATAPRES) 0.1 MG tablet Take 0.1 mg by mouth daily.    . cyclobenzaprine (FLEXERIL) 10 MG tablet Take 10 mg by mouth daily as needed for muscle spasms.   5  . donepezil (ARICEPT) 5 MG tablet Take 1 tablet by mouth daily.  5  . DULoxetine (CYMBALTA) 30 MG capsule Take 60 mg by mouth daily with breakfast.     . fluticasone (FLOVENT HFA) 110 MCG/ACT inhaler Inhale 2 puffs into the lungs daily as needed.     . gabapentin (NEURONTIN) 300 MG capsule Take 300-600 mg by mouth 2 (two) times daily. Takes 600mg  in the morning and 300mg  at bedtime    . glimepiride (AMARYL) 2 MG tablet Take 2 mg by mouth daily.     [name] latanoprost (XALATAN) 0.005 % ophthalmic solution Place 1 drop into both eyes at bedtime.  5  . lisinopril (PRINIVIL,ZESTRIL) 5 MG tablet Take 5 mg by mouth daily.    [name] omega-3 acid ethyl esters (LOVAZA) 1 G capsule Take 2 g by mouth 2 (two) times daily.     [name] oxyCODONE-acetaminophen (PERCOCET/ROXICET) 5-325 MG per tablet Take 1 tablet by mouth every 6 (six) hours as needed for severe pain. 20 tablet 0  . pioglitazone (ACTOS) 15 MG tablet Take 15 mg by mouth daily.      . rivaroxaban (XARELTO) 20 MG TABS tablet Take 20 mg by mouth daily.     [name] SPIRIVA HANDIHALER 18 MCG inhalation capsule Place 18 mcg into inhaler and inhale daily as needed.  5     ROS:                                                                                                                                       History  obtained from the patient  General ROS: negative for - chills, fatigue, fever, night sweats, weight gain or weight loss Psychological ROS: negative for - behavioral disorder, hallucinations, memory difficulties, mood swings or suicidal ideation Ophthalmic ROS: negative for - blurry vision, double vision, eye pain or loss of vision ENT ROS: negative for - epistaxis, nasal discharge, oral lesions, sore throat, tinnitus or vertigo Allergy and Immunology ROS: negative for - hives or itchy/watery eyes Hematological and Lymphatic ROS: negative for - bleeding problems, bruising or swollen lymph nodes Endocrine ROS: negative for - galactorrhea, hair pattern changes, polydipsia/polyuria or temperature intolerance Respiratory ROS: negative for - cough, hemoptysis, shortness of breath or wheezing Cardiovascular ROS: negative for - chest pain, dyspnea on exertion, edema or irregular heartbeat Gastrointestinal ROS: negative for - abdominal pain, diarrhea, hematemesis, nausea/vomiting or stool incontinence  Genito-Urinary ROS: negative for - dysuria, hematuria, incontinence or urinary frequency/urgency Musculoskeletal ROS: negative for - joint swelling or muscular weakness Neurological ROS: as noted in HPI Dermatological ROS: negative for rash and skin lesion changes  Neurologic Examination:                                                                                                      Blood pressure 144/80, pulse 39, temperature 97.9 F (36.6 C), temperature source Oral, resp. rate 17, height 5\' 8"  (1.727 m), weight 80.74 kg (178 lb), SpO2 95 %.  HEENT-  Normocephalic, no lesions, without obvious abnormality.  Normal external eye and conjunctiva.  Normal TM's bilaterally.  Normal auditory canals and external ears. Normal external nose, mucus membranes and septum.  Normal pharynx. Cardiovascular- irregularly irregular rhythm, pulses palpable throughout   Lungs- chest clear, no wheezing, rales, normal  symmetric air entry Abdomen- normal findings: bowel sounds normal Extremities- no edema Lymph-no adenopathy palpable Musculoskeletal-positive joint pain Skin-dry  Neurological Examination Mental Status: Alert, oriented, thought content appropriate.  Speech fluent without evidence of aphasia.  Able to follow 3 step commands without difficulty. Cranial Nerves: II: Discs flat bilaterally; Visual fields grossly normal, pupils -right is asymetric due to surgery and left is 63mm and symmetric, round, reactive to light and accommodation III,IV, VI: ptosis not present, extra-ocular motions intact bilaterally V,VII: smile symmetric, facial light touch sensation decreased on the right lower face VIII: hearing normal bilaterally IX,X: gag reflex present XI: bilateral shoulder shrug XII: midline tongue extension Motor: Right : Upper extremity   5/5    Left:     Upper extremity   5/5  Lower extremity   5/5     Lower extremity   5/5 -drift in right UE Tone and bulk:normal tone throughout; no atrophy noted Sensory: Pinprick and light touch decreased in the right arm and leg Deep Tendon Reflexes: 2+ and symmetric throughout Plantars: Right: downgoing   Left: downgoing Cerebellar: normal finger-to-nose,  normal heel-to-shin test Gait: not tested due to multiple leads.        Lab Results: Basic Metabolic Panel:  Recent Labs Lab 08/19/14 0847  NA 138  K 3.9  CL 102  CO2 24  GLUCOSE 140*  BUN 12  CREATININE 0.72  CALCIUM 9.1    Liver Function Tests: No results for input(s): AST, ALT, ALKPHOS, BILITOT, PROT, ALBUMIN in the last 168 hours. No results for input(s): LIPASE, AMYLASE in the last 168 hours. No results for input(s): AMMONIA in the last 168 hours.  CBC:  Recent Labs Lab 08/19/14 0847  WBC 6.4  HGB 15.6  HCT 44.9  MCV 92.6  PLT 157    Cardiac Enzymes: No results for input(s): CKTOTAL, CKMB, CKMBINDEX, TROPONINI in the last 168 hours.  Lipid Panel: No results  for input(s): CHOL, TRIG, HDL, CHOLHDL, VLDL, LDLCALC in the last 168 hours.  CBG: No results for input(s): GLUCAP in the last 168 hours.  Microbiology: Results for orders placed or performed during the hospital encounter of 05/14/12  Urine culture     Status:  None   Collection Time: 05/15/12 12:13 PM  Result Value Ref Range Status   Specimen Description URINE, CLEAN CATCH  Final   Special Requests NONE  Final   Culture  Setup Time 05/15/2012 13:13  Final   Colony Count NO GROWTH  Final   Culture NO GROWTH  Final   Report Status 05/16/2012 FINAL  Final    Coagulation Studies:  Recent Labs  08/19/14 0847  LABPROT 14.3  INR 1.10    Imaging: Dg Chest 2 View  08/19/2014   CLINICAL DATA:  Cough, right-sided weakness.  EXAM: CHEST  2 VIEW  COMPARISON:  February 03, 2014.  FINDINGS: Stable cardiomegaly. Status post coronary artery bypass graft. No pneumothorax or pleural effusion is noted. Both lungs are clear. The visualized skeletal structures are unremarkable.  IMPRESSION: No acute cardiopulmonary abnormality seen.   Electronically Signed   By: Marijo Conception, M.D.   On: 08/19/2014 09:33   Ct Head Wo Contrast  08/19/2014   CLINICAL DATA:  Right-sided numbness.  The patient fell yesterday.  EXAM: CT HEAD WITHOUT CONTRAST  TECHNIQUE: Contiguous axial images were obtained from the base of the skull through the vertex without intravenous contrast.  COMPARISON:  01/19/2014  FINDINGS: There is no acute intracranial hemorrhage, acute infarction, or mass lesion. There is slight diffuse atrophy consistent with age. There is a small amount of air at the confluence of the straight sinus and the transverse sinuses. This is probably due to the IV.  No osseous abnormality.  IMPRESSION: No significant abnormality.   Electronically Signed   By: Lorriane Shire M.D.   On: 08/19/2014 09:34       Assessment and plan discussed with with attending physician and they are in agreement.    Etta Quill  PA-C [facility] [phone]  08/19/2014, 10:47 AM   Assessment: 69 y.o. male presenting to hospital after new onset right face, arm and leg numbness and weakness which started 2 days prior. Initial CT head showed no acute infarct or bleed. Exam is positive for right arm drift, right face arm and leg decreased sensation. Patient is currently on Xarelto for Afib and was out of the window for any intervention or tPA.   Stroke Risk Factors - atrial fibrillation, diabetes mellitus, hyperlipidemia, hypertension and smoking  Recommend: 1. HgbA1c, fasting lipid panel 2. MRI, MRA  of the brain without contrast 3. PT consult, OT consult, Speech consult 4. Echocardiogram 5. Carotid dopplers 6. Prophylactic therapy-continue Xarelto 7. Risk factor modification 8. Telemetry monitoring 9. Frequent neuro checks 10 NPO until passes stroke swallow screen  Jim Like, DO [facility] [phone]  If 7pm- 7am, please page neurology on call as listed in [place].

## 2014-08-19 NOTE — ED Notes (Signed)
Attempted report X1

## 2014-08-19 NOTE — ED Provider Notes (Signed)
CSN: [id number]     Arrival date & time 08/19/14  0810 History   First MD Initiated Contact with Patient 08/19/14 [id number]     Chief Complaint  Patient presents with  . Numbness     (Consider location/radiation/quality/duration/timing/severity/associated sxs/prior Treatment) Patient is a 69 y.o. male presenting with neurologic complaint. The history is provided by the patient.  Neurologic Problem This is a new problem. The current episode started more than 2 days ago. The problem occurs constantly. The problem has not changed since onset.Pertinent negatives include no chest pain, no abdominal pain and no shortness of breath. Nothing aggravates the symptoms. Nothing relieves the symptoms.    Past Medical History  Diagnosis Date  . Chronic ischemic heart disease   . Atrial fibrillation   . Peripheral vascular disease   . Hypertension   . CAD (coronary artery disease)   . Hyperlipidemia   . Depression   . Actinic keratosis   . Degeneration of cervical intervertebral disc   . Osteoarthrosis, unspecified whether generalized or localized, shoulder region   . Other disorder of muscle, ligament, and fascia   . Carpal tunnel syndrome   . COPD (chronic obstructive pulmonary disease)   . Myocardial infarction   . Shortness of breath   . Diabetes mellitus     type 2   Past Surgical History  Procedure Laterality Date  . Coronary artery bypass graft    . Coronary angioplasty    . Carpal tunnel release    . Finger amputation     Family History  Problem Relation Age of Onset  . Diabetes Mother   . Pneumonia Father   . Gout Brother    History  Substance Use Topics  . Smoking status: Current Every Day Smoker -- 0.50 packs/day for 56 years    Types: Cigarettes  . Smokeless tobacco: Never Used  . Alcohol Use: No    Review of Systems  Constitutional: Negative for fever.  Respiratory: Negative for shortness of breath.   Cardiovascular: Negative for chest pain.  Gastrointestinal:  Negative for vomiting and abdominal pain.  All other systems reviewed and are negative.     Allergies  Codeine  Home Medications   Prior to Admission medications   Medication Sig Start Date End Date Taking? Authorizing Provider  amLODipine (NORVASC) 5 MG tablet Take 5 mg by mouth daily.      Historical Provider, MD  atorvastatin (LIPITOR) 80 MG tablet Take 80 mg by mouth daily.      Historical Provider, MD  cloNIDine (CATAPRES) 0.1 MG tablet Take 0.1 mg by mouth daily.    Historical Provider, MD  donepezil (ARICEPT) 5 MG tablet Take 1 tablet by mouth daily. 06/14/14   Historical Provider, MD  DULoxetine (CYMBALTA) 30 MG capsule Take 60 mg by mouth daily with breakfast.     Historical Provider, MD  fluticasone (FLOVENT HFA) 110 MCG/ACT inhaler Inhale 2 puffs into the lungs daily as needed.     Historical Provider, MD  gabapentin (NEURONTIN) 300 MG capsule Take 300-600 mg by mouth 2 (two) times daily. Takes 600mg  in the morning and 300mg  at bedtime    Historical Provider, MD  glimepiride (AMARYL) 2 MG tablet Take 2 mg by mouth daily.     Historical Provider, MD  lisinopril (PRINIVIL,ZESTRIL) 5 MG tablet Take 5 mg by mouth daily.    Historical Provider, MD  omega-3 acid ethyl esters (LOVAZA) 1 G capsule Take 2 g by mouth 2 (two) times daily.  Historical Provider, MD  oxyCODONE-acetaminophen (PERCOCET/ROXICET) 5-325 MG per tablet Take 1 tablet by mouth every 6 (six) hours as needed for severe pain. 08/16/14   [place], PA-C  pioglitazone (ACTOS) 15 MG tablet Take 15 mg by mouth daily.      Historical Provider, MD  rivaroxaban (XARELTO) 20 MG TABS tablet Take 20 mg by mouth daily.     Historical Provider, MD   BP 147/83 mmHg  Pulse 88  Temp(Src) 97.9 F (36.6 C) (Oral)  Resp 17  Ht 5\' 8"  (1.727 m)  Wt 178 lb (80.74 kg)  BMI 27.07 kg/m2  SpO2 96% Physical Exam  Constitutional: He is oriented to person, place, and time. He appears well-developed and well-nourished. No  distress.  HENT:  Head: Normocephalic and atraumatic.  Mouth/Throat: No oropharyngeal exudate.  Eyes: EOM are normal. Pupils are equal, round, and reactive to light.  Neck: Normal range of motion. Neck supple.  Cardiovascular: Normal rate and regular rhythm.  Exam reveals no friction rub.   No murmur heard. Pulmonary/Chest: Effort normal and breath sounds normal. No respiratory distress. He has no wheezes. He has no rales.  Abdominal: He exhibits no distension. There is no tenderness. There is no rebound.  Musculoskeletal: Normal range of motion. He exhibits no edema.  Neurological: He is alert and oriented to person, place, and time. A cranial nerve deficit (R sided facial numbness) and sensory deficit (R sided arm and leg subjective numbness) is present. He exhibits abnormal muscle tone (mild R leg weakness). Gait (swinging R leg) abnormal. Coordination normal. GCS eye subscore is 4. GCS verbal subscore is 5. GCS motor subscore is 6.  Skin: He is not diaphoretic.  Nursing note and vitals reviewed.   ED Course  Procedures (including critical care time) Labs Review Labs Reviewed  CBC  BASIC METABOLIC PANEL  URINE RAPID DRUG SCREEN (HOSP PERFORMED)  PROTIME-INR  Randolm Idol, ED    Imaging Review Dg Chest 2 View  08/19/2014   CLINICAL DATA:  Cough, right-sided weakness.  EXAM: CHEST  2 VIEW  COMPARISON:  February 03, 2014.  FINDINGS: Stable cardiomegaly. Status post coronary artery bypass graft. No pneumothorax or pleural effusion is noted. Both lungs are clear. The visualized skeletal structures are unremarkable.  IMPRESSION: No acute cardiopulmonary abnormality seen.   Electronically Signed   By: Marijo Conception, M.D.   On: 08/19/2014 09:33   Ct Head Wo Contrast  08/19/2014   CLINICAL DATA:  Right-sided numbness.  The patient fell yesterday.  EXAM: CT HEAD WITHOUT CONTRAST  TECHNIQUE: Contiguous axial images were obtained from the base of the skull through the vertex without  intravenous contrast.  COMPARISON:  01/19/2014  FINDINGS: There is no acute intracranial hemorrhage, acute infarction, or mass lesion. There is slight diffuse atrophy consistent with age. There is a small amount of air at the confluence of the straight sinus and the transverse sinuses. This is probably due to the IV.  No osseous abnormality.  IMPRESSION: No significant abnormality.   Electronically Signed   By: Lorriane Shire M.D.   On: 08/19/2014 09:34   Mr Brain Wo Contrast  08/19/2014   CLINICAL DATA:  New onset right face, arm, and leg numbness and weakness beginning 2 days ago. Current history of atrial fibrillation on Xarelto.  EXAM: MRI HEAD WITHOUT CONTRAST  MRA HEAD WITHOUT CONTRAST  TECHNIQUE: Multiplanar, multiecho pulse sequences of the brain and surrounding structures were obtained without intravenous contrast. Angiographic images of the head were  obtained using MRA technique without contrast.  COMPARISON:  Head CT 08/19/2014  FINDINGS: MRI HEAD FINDINGS  There is a 5 mm acute infarct in the left pons. There is no evidence of intracranial hemorrhage, mass, midline shift, or extra-axial fluid collection. There is mild generalized cerebral atrophy. Foci of T2 hyperintensity in the subcortical and deep cerebral white matter bilaterally are nonspecific but compatible with moderate chronic small vessel ischemic disease.  Orbits are unremarkable. Mild right maxillary sinus and minimal left ethmoid air cell mucosal thickening is noted. Mastoid air cells are clear. Major intracranial vascular flow voids are preserved. A small right TMJ effusion is noted.  MRA HEAD FINDINGS  The visualized distal vertebral arteries are patent with the right being dominant. There is irregularity and mild narrowing of the distal right vertebral artery. The distal left vertebral artery is irregular and moderately narrowed. PICA origins are grossly patent. 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  SCAs are patent and diffusely irregular, moderate to severe on the right and mild to moderate on the left. There are fetal type origins of the PCAs. No proximal PCA stenosis is identified, however there is moderate irregularity and attenuation of PCA branch vessels, right more so than left.  Internal carotid arteries are patent from skullbase to carotid termini. There is mild carotid siphon irregularity bilaterally without significant stenosis. ACAs are unremarkable. M1 segments are patent without stenosis. No major MCA branch vessel occlusion is identified, however there is moderate branch vessel irregularity bilaterally. No intracranial aneurysm is identified.  IMPRESSION: 1. Small acute left pontine infarct. 2. Moderate chronic small vessel ischemic disease. 3. No major intracranial arterial occlusion. 4. 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   Electronically Signed   By: Logan Bores   On: 08/19/2014 14:59   Mr Jodene Nam Head/brain Wo Cm  08/19/2014   CLINICAL DATA:  New onset right face, arm, and leg numbness and weakness beginning 2 days ago. Current history of atrial fibrillation on Xarelto.  EXAM: MRI HEAD WITHOUT CONTRAST  MRA HEAD WITHOUT CONTRAST  TECHNIQUE: Multiplanar, multiecho pulse sequences of the brain and surrounding structures were obtained without intravenous contrast. Angiographic images of the head were obtained using MRA technique without contrast.  COMPARISON:  Head CT 08/19/2014  FINDINGS: MRI HEAD FINDINGS  There is a 5 mm acute infarct in the left pons. There is no evidence of intracranial hemorrhage, mass, midline shift, or extra-axial fluid collection. There is mild generalized cerebral atrophy. Foci of T2 hyperintensity in the subcortical and deep cerebral white matter bilaterally are nonspecific but compatible with  moderate chronic small vessel ischemic disease.  Orbits are unremarkable. Mild right maxillary sinus and minimal left ethmoid air cell mucosal thickening is noted. Mastoid air cells are clear. Major intracranial vascular flow voids are preserved. A small right TMJ effusion is noted.  MRA HEAD FINDINGS  The visualized distal vertebral arteries are patent with the right being dominant. There is irregularity and mild narrowing of the distal right vertebral artery. The distal left vertebral artery is irregular and moderately narrowed. PICA origins are grossly patent. 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  SCAs are patent and diffusely irregular, moderate to severe on the right and mild to moderate on the left. There are  fetal type origins of the PCAs. No proximal PCA stenosis is identified, however there is moderate irregularity and attenuation of PCA branch vessels, right more so than left.  Internal carotid arteries are patent from skullbase to carotid termini. There is mild carotid siphon irregularity bilaterally without significant stenosis. ACAs are unremarkable. M1 segments are patent without stenosis. No major MCA branch vessel occlusion is identified, however there is moderate branch vessel irregularity bilaterally. No intracranial aneurysm is identified.  IMPRESSION: 1. Small acute left pontine infarct. 2. Moderate chronic small vessel ischemic disease. 3. No major intracranial arterial occlusion. 4. 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   Electronically Signed   By: Logan Bores   On: 08/19/2014 14:59     EKG Interpretation   Date/Time:  Thursday August 19 2014 08:29:32 EST Ventricular Rate:  86 PR Interval:    QRS Duration: 103 QT Interval:  393 QTC Calculation: 470 R Axis:   49 Text  Interpretation:  Atrial fibrillation Similar to prior No significant  change since last tracing Confirmed by [facility]  MD, Alegria Dominique ([id number]) on  08/19/2014 8:52:23 AM      MDM   Final diagnoses:  Numbness on right side    78M here with R sided numbness. Hx of Afib on Xarelto, HTN, MI, DM presents with R sided numbness. Fell 5 days ago, elevated 3 days ago at [facility] with negative upper extremity xray. Fell later that night, per son, whom he lives with, has been nonambulatory and in the bed ever since. Patient is a poor historian, but denies head injury or loss of consciousness.  No CP, SOB, fever. Here vitals ok. R side of face with subjective numbness, R arm and leg with subjective numbness. Mild decreased strength in R leg. Patient's gait shuffling, swinging R leg, limping which is new. Concern for stroke. Will start with CT of head, labs. CT negative. Admitted for concern of stroke.    Evelina Bucy, MD 08/19/14 [phone]

## 2014-08-19 NOTE — ED Notes (Signed)
Pt gone to MRI 

## 2014-08-19 NOTE — ED Notes (Addendum)
Pt. Is from home, lives with son. Hx of dementia. Pt. [place] Saturday and was seen at [facility] long. Pt. States he fell yesterday morning, went back to bed, and woke up with R sided numbness. No weakness to extremities. Uncertain whether patient hit head or lost consciousness. Pt. Is poor historian, uncertain what time events occurred. Pt. Is on xarelto.

## 2014-08-19 NOTE — ED Notes (Signed)
Pt currently in CT.

## 2014-08-19 NOTE — H&P (Signed)
[facility] History and Physical                                                                                    Wayne [name], is a 69 y.o. male  MRN: [id number]   DOB - 12/05/1945  Admit Date - 08/19/2014  Outpatient Primary MD for the patient is Philis Fendt, MD  With History of -  Past Medical History  Diagnosis Date  . Chronic ischemic heart disease   . Atrial fibrillation   . Peripheral vascular disease   . Hypertension   . CAD (coronary artery disease)   . Hyperlipidemia   . Depression   . Actinic keratosis   . Degeneration of cervical intervertebral disc   . Osteoarthrosis, unspecified whether generalized or localized, shoulder region   . Other disorder of muscle, ligament, and fascia   . Carpal tunnel syndrome   . COPD (chronic obstructive pulmonary disease)   . Myocardial infarction   . Shortness of breath   . Diabetes mellitus     type 2      Past Surgical History  Procedure Laterality Date  . Coronary artery bypass graft    . Coronary angioplasty    . Carpal tunnel release    . Finger amputation      in for   Chief Complaint  Patient presents with  . Numbness     HPI  Wayne [name]  is a 69 y.o. male, recently diagnosed with dementia by Dr. Leta Baptist 07/07/14, who has a past medical history of coronary artery disease, COPD, peripheral vascular disease, A. fib (on Xeralto) diabetes, bipolar disorder and long-term tobacco abuse. He presented to [facility] ER on 2/8 after a mechanical fall. He was evaluated and discharged. He presents to [facility] ER this morning with right sided numbness, weakness, and gait abnormality after falling out of bed on 08/18/13 AM (the patient does not recall what time this happened).  Mr. Rickel complains that his entire right side feels cold. From his head to his feet he feels as if he has no circulation on his right side. He states that when he tried to walk this morning he had to drag his right foot. He denies recent  illness, and previous history of such symptoms. He has been compliant in taking his medications including Xarelto. He and his wife (who is 59) recently moved in with his son after the patient was diagnosed with dementia.  In the ER, CT head is negative, chest x-ray is clear, neurology has been consulted.  Review of Systems   In addition to the HPI above,  No Fever-chills, No Headache, No changes with Vision or hearing, No problems swallowing food or Liquids, No Chest pain, Cough or Shortness of Breath, No Abdominal pain, No Nausea or Vomiting, Bowel movements are regular, No Blood in stool or Urine, No dysuria, No new skin rashes or bruises, A full 10 point Review of Systems was done, except as stated above, all other Review of Systems were negative.  Social History History  Substance Use Topics  . Smoking status: Current Every Day Smoker -- 0.50 packs/day for 56 years    Types:  Cigarettes  . Smokeless tobacco: Never Used  . Alcohol Use: No   he has smoked 1/2 pack of cigarettes per day since he was 69 years old. He denies alcohol use and recreational drug use.  Family History Family History  Problem Relation Age of Onset  . Diabetes Mother   . Pneumonia Father   . Gout Brother    his father died with an MI, his mother died with complications from diabetes.  Prior to Admission medications   Medication Sig Start Date End Date Taking? Authorizing Provider  amLODipine (NORVASC) 5 MG tablet Take 5 mg by mouth daily.     Yes Historical Provider, MD  atorvastatin (LIPITOR) 80 MG tablet Take 80 mg by mouth daily.     Yes Historical Provider, MD  cloNIDine (CATAPRES) 0.1 MG tablet Take 0.1 mg by mouth daily.   Yes Historical Provider, MD  cyclobenzaprine (FLEXERIL) 10 MG tablet Take 10 mg by mouth daily as needed for muscle spasms.  07/20/14  Yes Historical Provider, MD  donepezil (ARICEPT) 5 MG tablet Take 1 tablet by mouth daily. 06/14/14  Yes Historical Provider, MD  DULoxetine  (CYMBALTA) 30 MG capsule Take 60 mg by mouth daily with breakfast.    Yes Historical Provider, MD  fluticasone (FLOVENT HFA) 110 MCG/ACT inhaler Inhale 2 puffs into the lungs daily as needed.    Yes Historical Provider, MD  gabapentin (NEURONTIN) 300 MG capsule Take 300-600 mg by mouth 2 (two) times daily. Takes 600mg  in the morning and 300mg  at bedtime   Yes Historical Provider, MD  glimepiride (AMARYL) 2 MG tablet Take 2 mg by mouth daily.    Yes Historical Provider, MD  latanoprost (XALATAN) 0.005 % ophthalmic solution Place 1 drop into both eyes at bedtime. 07/06/14  Yes Historical Provider, MD  lisinopril (PRINIVIL,ZESTRIL) 5 MG tablet Take 5 mg by mouth daily.   Yes Historical Provider, MD  omega-3 acid ethyl esters (LOVAZA) 1 G capsule Take 2 g by mouth 2 (two) times daily.    Yes Historical Provider, MD  oxyCODONE-acetaminophen (PERCOCET/ROXICET) 5-325 MG per tablet Take 1 tablet by mouth every 6 (six) hours as needed for severe pain. 08/16/14  Yes Resa Miner Lawyer, PA-C  pioglitazone (ACTOS) 15 MG tablet Take 15 mg by mouth daily.     Yes Historical Provider, MD  rivaroxaban (XARELTO) 20 MG TABS tablet Take 20 mg by mouth daily.    Yes Historical Provider, MD  SPIRIVA HANDIHALER 18 MCG inhalation capsule Place 18 mcg into inhaler and inhale daily as needed. 06/16/14  Yes Historical Provider, MD    Allergies  Allergen Reactions  . Codeine Itching    Physical Exam  Vitals  Blood pressure 144/80, pulse 39, temperature 97.9 F (36.6 C), temperature source Oral, resp. rate 17, height 5\' 8"  (1.727 m), weight 80.74 kg (178 lb), SpO2 95 %.   General:  Well-developed, thin, unkempt male, smells of tobacco, lying comfortably in bed, NAD, appears old than stated age.  Psych:  Mildly forgetful not a good historian, Not Suicidal or Homicidal, Awake Alert, Oriented X 3.  Neuro:   Decreased sensation in the right foot and right side of his face (he reports equal sensation in his arms and  legs). 5 over 5 strength in each extremity. Cranial nerves appear grossly intact. Minimal horizontal nystagmus noted.  ENT:  Left pupil is irregularly shaped (chronic), oropharynx has no exudates or erythema, mucous membranes appear slightly dry  Neck:  Supple, No lymphadenopathy appreciated  Respiratory:  Expiratory rhonchi on the right, otherwise clear to auscultation with good air movement.  Cardiac:  Irregular rate irregular rhythm, No Murmurs, no LE edema noted, no JVD.    Abdomen:  Positive bowel sounds, Soft, Non tender, Non distended,  No masses appreciated  Skin:  No Cyanosis,  No Skin Rash or Bruise.  Extremities: All 4 are warm and dry without signs of cyanosis or injury. Able to move all 4. 5/5 strength in each,    Data Review  CBC  Recent Labs Lab 08/19/14 0847  WBC 6.4  HGB 15.6  HCT 44.9  PLT 157  MCV 92.6  MCH 32.2  MCHC 34.7  RDW 13.1    Chemistries   Recent Labs Lab 08/19/14 0847  NA 138  K 3.9  CL 102  CO2 24  GLUCOSE 140*  BUN 12  CREATININE 0.72  CALCIUM 9.1     Coagulation profile  Recent Labs Lab 08/19/14 0847  INR 1.10     Urinalysis    Component Value Date/Time   COLORURINE YELLOW 05/15/2012 1213   APPEARANCEUR CLEAR 05/15/2012 1213   LABSPEC 1.013 05/15/2012 1213   PHURINE 5.0 05/15/2012 1213   GLUCOSEU NEGATIVE 05/15/2012 1213   HGBUR NEGATIVE 05/15/2012 1213   [place] 05/15/2012 1213   KETONESUR NEGATIVE 05/15/2012 1213   PROTEINUR NEGATIVE 05/15/2012 1213   UROBILINOGEN 1.0 05/15/2012 1213   NITRITE NEGATIVE 05/15/2012 1213   LEUKOCYTESUR NEGATIVE 05/15/2012 1213    Imaging results:   Dg Chest 2 View  08/19/2014   CLINICAL DATA:  Cough, right-sided weakness.  EXAM: CHEST  2 VIEW  COMPARISON:  February 03, 2014.  FINDINGS: Stable cardiomegaly. Status post coronary artery bypass graft. No pneumothorax or pleural effusion is noted. Both lungs are clear. The visualized skeletal structures are unremarkable.   IMPRESSION: No acute cardiopulmonary abnormality seen.   Electronically Signed   By: Marijo Conception, M.D.   On: 08/19/2014 09:33   Dg Forearm Left  08/16/2014   CLINICAL DATA:  Status post fall while moving furniture 08/14/2014. Left forearm pain. Initial encounter.  EXAM: LEFT FOREARM - 2 VIEW  COMPARISON:  None.  FINDINGS: No acute bony or joint abnormality is identified. Vascular calcifications are noted.  IMPRESSION: No acute finding.   Electronically Signed   By: Inge Rise M.D.   On: 08/16/2014 12:56   Ct Head Wo Contrast  08/19/2014   CLINICAL DATA:  Right-sided numbness.  The patient fell yesterday.  EXAM: CT HEAD WITHOUT CONTRAST  TECHNIQUE: Contiguous axial images were obtained from the base of the skull through the vertex without intravenous contrast.  COMPARISON:  01/19/2014  FINDINGS: There is no acute intracranial hemorrhage, acute infarction, or mass lesion. There is slight diffuse atrophy consistent with age. There is a small amount of air at the confluence of the straight sinus and the transverse sinuses. This is probably due to the IV.  No osseous abnormality.  IMPRESSION: No significant abnormality.   Electronically Signed   By: Lorriane Shire M.D.   On: 08/19/2014 09:34   Dg Shoulder Left  08/16/2014   CLINICAL DATA:  Fall with left shoulder pain.  Initial encounter  EXAM: LEFT SHOULDER - 2+ VIEW  COMPARISON:  02/03/2014  FINDINGS: Advanced glenohumeral osteoarthritis with large marginal spurs and joint narrowing. There has been no progression since 2015. Chronic spurring and fragmentation of the acromioclavicular joint without new widening or malalignment.  No evidence of left chest wall trauma.  IMPRESSION: 1.  No acute osseous findings. 2. Advanced acromioclavicular and glenohumeral osteoarthritis.   Electronically Signed   By: Jorje Guild M.D.   On: 08/16/2014 15:16    My personal review of EKG: afib, rate controlled.   Assessment & Plan  Principal Problem:    Numbness on right side Active Problems:   Coronary atherosclerosis   FIBRILLATION, ATRIAL   Smoking   Dementia  Right-sided weakness and numbness Started 08/18/13 morning.  CT head is negative. Neurology's consulted. The patient will be admitted to neuro telemetry using the ischemic stroke order set. Will hold gabapentin. Blood pressure is currently normal.  We will temporarily hold amlodipine to allow for permissive hypertension. He is currently taking Xeralto for atrial fibrillation.  HTN Controlled.  Hold amlodipine, but continue clonidine, lisinopril.  Tobacco abuse Long-term. Patient was counseled, but does not indicate desire to quit smoking. Nicotine patch ordered.  Diabetes mellitus Oral hyperglycemic medications will be held. Check A1c. Utilize low-dose basal-bolus regimen as an inpatient.  Dementia New diagnosis. Continue Aricept.  COPD Stable with some coarse breath sounds.  Continue home medications (Spiriva, flovent) but change them from PRN to scheduled.  Cholesterolemia Continue Lovaza and lipitor.  Bipolar Disorder Appears stable. Continue cymbalta   DVT Prophylaxis: Xeralto  AM Labs Ordered, also please review Full Orders  Family Communication:   None.  Patient is alert and orientated.  Will attempt to call son.  Code Status:  Full  Condition:  guarded  Time spent in minutes : Lindsay,  PA-C on 08/19/2014 at 10:45 AM  Between 7am to 7pm - Pager - [phone]  After 7pm go to [email or web address] - password [email or web address]  And look for the night coverage person covering me after hours  [facility]

## 2014-08-20 DIAGNOSIS — I6330 Cerebral infarction due to thrombosis of unspecified cerebral artery: Secondary | ICD-10-CM | POA: Insufficient documentation

## 2014-08-20 DIAGNOSIS — Z720 Tobacco use: Secondary | ICD-10-CM

## 2014-08-20 DIAGNOSIS — I6789 Other cerebrovascular disease: Secondary | ICD-10-CM

## 2014-08-20 LAB — LIPID PANEL
CHOL/HDL RATIO: 4.3 ratio
Cholesterol: 156 mg/dL (ref 0–200)
HDL: 36 mg/dL — ABNORMAL LOW (ref >39)
LDL Cholesterol: 108 mg/dL — ABNORMAL HIGH (ref 0–99)
Triglycerides: 59 mg/dL (ref <150)
VLDL: 12 mg/dL (ref 0–40)

## 2014-08-20 LAB — HEMOGLOBIN A1C
Hgb A1c MFr Bld: 6.7 % — ABNORMAL HIGH (ref 4.8–5.6)
MEAN PLASMA GLUCOSE: 146 mg/dL

## 2014-08-20 LAB — GLUCOSE, CAPILLARY
GLUCOSE-CAPILLARY: 101 mg/dL — AB (ref 70–99)
Glucose-Capillary: 162 mg/dL — ABNORMAL HIGH (ref 70–99)

## 2014-08-20 MED ORDER — NICOTINE 14 MG/24HR TD PT24: 14.0000 mg | MEDICATED_PATCH | Freq: Every day | TRANSDERMAL | Status: DC

## 2014-08-20 MED ORDER — CARVEDILOL 3.125 MG PO TABS: 3.1250 mg | ORAL_TABLET | Freq: Two times a day (BID) | ORAL | Status: DC

## 2014-08-20 MED ORDER — UNABLE TO FIND: Status: DC

## 2014-08-20 NOTE — Evaluation (Signed)
Physical Therapy Evaluation Patient Details Name: Wayne [name] MRN: [id number] DOB: Aug 20, 1945 Today's Date: 08/20/2014   History of Present Illness  Adm 08/19/14 with Rt sided numbness and fall; MRI + Lt pontine CVA PMHx-recent diagnosed dementia; bipolar; afib, DM, Copd,  Clinical Impression  Pt admitted with above diagnosis. Pt currently with functional limitations due to the deficits listed below (see PT Problem List). Patient with RLE weakness resulting in mild Rt foot drop (especially with fatigue) and must use RW at all times for safety. Pt stated understanding and is agreeable to OPPT. Question his ability to recall with noted recently diagnosed dementia. Have asked RN to review with family on discharge. (States his son-in-law can provide transportation). Pt will benefit from skilled PT to increase their independence and safety with mobility to allow discharge to the venue listed below.       Follow Up Recommendations Outpatient PT;Supervision/Assistance - 24 hour    Equipment Recommendations  None recommended by PT    Recommendations for Other Services OT consult     Precautions / Restrictions Precautions Precautions: Fall      Mobility  Bed Mobility                  Transfers Overall transfer level: Needs assistance Equipment used: Rolling walker (2 wheeled) Transfers: Sit to/from Stand Sit to Stand: Min guard         General transfer comment: x2 with vc for safe use of RWj  Ambulation/Gait Ambulation/Gait assistance: Min guard Ambulation Distance (Feet): 200 Feet (seated rest, donned shoes, 400 ft) Assistive device: Rolling walker (2 wheeled);None Gait Pattern/deviations: Step-through pattern;Decreased stride length;Decreased dorsiflexion - right;Steppage   Gait velocity interpretation: Below normal speed for age/gender General Gait Details: with RW pt with slight "scuffing"/dragging of Rt foot during swing; without RW, pt with steppage gait and decr  balance; with shoes pt able to focus on Rt heel strike, translating into better DF--however did fatigue and begin to drag RT foot slightly again  Stairs            Wheelchair Mobility    Modified Rankin (Stroke Patients Only) Modified Rankin (Stroke Patients Only) Pre-Morbid Rankin Score: No symptoms Modified Rankin: Moderately severe disability     Balance Overall balance assessment: Needs assistance Sitting-balance support: No upper extremity supported;Feet supported Sitting balance-Leahy Scale: Normal Sitting balance - Comments: reaching to floor as donning shoes (multiple times)   Standing balance support: No upper extremity supported Standing balance-Leahy Scale: Fair                               Pertinent Vitals/Pain Pain Assessment: No/denies pain    Home Living Family/patient expects to be discharged to:: Private residence Living Arrangements: Spouse/significant other (pt reports she is 69 yo and in good health) Available Help at Discharge: Family;Available 24 hours/day Type of Home: House Home Access: Stairs to enter Entrance Stairs-Rails: Right Entrance Stairs-Number of Steps: 3 Home Layout: One level Home Equipment: Walker - 2 wheels (RW was his wife's (she does not use now))      Prior Function Level of Independence: Independent         Comments: denies imbalance or falls prior to this (noted fall and visit to ER 2 days PTA; ? related?)     Hand Dominance   Dominant Hand: Right    Extremity/Trunk Assessment   Upper Extremity Assessment: Defer to OT evaluation  Lower Extremity Assessment: RLE deficits/detail RLE Deficits / Details: AROM WFL; ankle DF 4+/5 however quickly fatigues with gait with + fioot drop    Cervical / Trunk Assessment: Normal  Communication   Communication: No difficulties  Cognition Arousal/Alertness: Awake/alert Behavior During Therapy: Flat affect Overall Cognitive Status: No  family/caregiver present to determine baseline cognitive functioning                      General Comments      Exercises        Assessment/Plan    PT Assessment All further PT needs can be met in the next venue of care  PT Diagnosis Hemiplegia dominant side;Abnormality of gait   PT Problem List Decreased strength;Decreased activity tolerance;Decreased balance;Decreased mobility;Decreased knowledge of use of DME;Impaired sensation  PT Treatment Interventions     PT Goals (Current goals can be found in the Care Plan section) Acute Rehab PT Goals Patient Stated Goal: go home today PT Goal Formulation: All assessment and education complete, DC therapy    Frequency     Barriers to discharge        Co-evaluation               End of Session Equipment Utilized During Treatment: Gait belt Activity Tolerance: Patient limited by fatigue Patient left: in chair;with call bell/phone within reach;with chair alarm set Nurse Communication: Mobility status;Other (comment) (need for RW; Slight Rt foot drop)         Time: 3276-1470 PT Time Calculation (min) (ACUTE ONLY): 28 min   Charges:   PT Evaluation $Initial PT Evaluation Tier I: 1 Procedure PT Treatments $Gait Training: 8-22 mins   PT G Codes:        Vikas Wegmann 09-14-2014, 12:25 PM Pager [phone]

## 2014-08-20 NOTE — Progress Notes (Signed)
Patient is being d/c, d/c instructions given and patient verbalized understanding.

## 2014-08-20 NOTE — Progress Notes (Signed)
  Echocardiogram 2D Echocardiogram has been performed.  Jefferey Lippmann FRANCES 08/20/2014, 11:06 AM

## 2014-08-20 NOTE — Evaluation (Signed)
Occupational Therapy Evaluation Patient Details Name: Wayne [name] MRN: [id number] DOB: 1945/08/31 Today's Date: 08/20/2014    History of Present Illness Adm 08/19/14 with Rt sided numbness and fall; MRI + Lt pontine CVA PMHx-recent diagnosed dementia; bipolar; afib, DM, Copd,   Clinical Impression   Patient admitted with above. Patient independent PTA. Patient currently requires supervision using RW for ADLs, functional mobility, and functional transfers. D/C from acute OT services and no additional follow-up OT needs at this time. All appropriate education provided to patient. Please re-order OT if needed.      Follow Up Recommendations  No OT follow up;Supervision/Assistance - 24 hour    Equipment Recommendations  None recommended by OT (Pt states he has a 3-in-1 for use at home)    Recommendations for Other Services  None known at this time     Precautions / Restrictions Precautions Precautions: Fall Restrictions Weight Bearing Restrictions: No      Mobility Bed Mobility General bed mobility comments: Patient seated in recliner upon OT entering room.   Transfers Overall transfer level: Needs assistance Equipment used: Rolling walker (2 wheeled) Transfers: Sit to/from Stand Sit to Stand: Supervision         General transfer comment: x2 with vc for safe use of RWj    Balance Overall balance assessment: Needs assistance Sitting-balance support: No upper extremity supported;Feet supported Sitting balance-Leahy Scale: Normal Sitting balance - Comments: reaching to floor as donning shoes (multiple times)   Standing balance support: No upper extremity supported Standing balance-Leahy Scale: Fair     ADL Overall ADL's : Needs assistance/impaired   General ADL Comments: Pt overall supervision for ADLs. Recommend use of RW for functional mobility and transfers for safety at this time. Also recommend wife be there to provide supervision to ensure patient is safe.  Patient can reach > BLEs for LB ADLs and cross BLEs.      Vision Vision Assessment?: No apparent visual deficits   Perception Perception Perception Tested?: No   Praxis Praxis Praxis tested?: Within functional limits    Pertinent Vitals/Pain Pain Assessment: No/denies pain     Hand Dominance Right   Extremity/Trunk Assessment Upper Extremity Assessment Upper Extremity Assessment: Generalized weakness   Lower Extremity Assessment Lower Extremity Assessment: RLE deficits/detail RLE Deficits / Details: AROM WFL; ankle DF 4+/5 however quickly fatigues with gait with + fioot drop RLE Sensation: decreased light touch (decr, but perceives area correctly) RLE Coordination: decreased fine motor (difficulty manipulating his foot into his shoe)   Cervical / Trunk Assessment Cervical / Trunk Assessment: Normal   Communication Communication Communication: No difficulties   Cognition Arousal/Alertness: Awake/alert Behavior During Therapy: Flat affect Overall Cognitive Status: No family/caregiver present to determine baseline cognitive functioning              Home Living Family/patient expects to be discharged to:: Private residence Living Arrangements: Spouse/significant other (pt reports she is 69 yo and in good health) Available Help at Discharge: Family;Available 24 hours/day Type of Home: House Home Access: Stairs to enter [employer] of Steps: 3 Entrance Stairs-Rails: Right Home Layout: One level     Bathroom Shower/Tub: Tub/shower unit;Curtain   Biochemist, clinical: Standard     Home Equipment: Environmental consultant - 2 wheels;Bedside commode (equipment is wife's, but patient states he can use it)          Prior Functioning/Environment Level of Independence: Independent        Comments: denies imbalance or falls prior to this (noted fall and  visit to ER 2 days PTA; ? related?)    OT Diagnosis: Generalized weakness   OT Problem List:  n/a, no acute OT needs    OT Treatment/Interventions:   n/a, no acute OT needs   OT Goals(Current goals can be found in the care plan section) Acute Rehab OT Goals Patient Stated Goal: "when am I going home?"  OT Frequency:  n/a, no acute OT needs   Barriers to D/C:  None known at this time.          End of Session Equipment Utilized During Treatment: Rolling walker  Activity Tolerance: Patient tolerated treatment well Patient left: in chair;with call bell/phone within reach   Time: 1321-1331 OT Time Calculation (min): 10 min Charges:  OT General Charges $OT Visit: 1 Procedure OT Evaluation $Initial OT Evaluation Tier I: 1 Procedure  Hanin [place] , MS, OTR/L, CLT Pager: [phone]  08/20/2014, 1:37 PM

## 2014-08-20 NOTE — Care Management Note (Addendum)
  Page 1 of 1   08/20/2014     2:32:21 PM CARE MANAGEMENT NOTE 08/20/2014  Patient:  Wayne [name], Wayne [name]   Account Number:  [id number]  Date Initiated:  08/20/2014  Documentation initiated by:  Lorne Skeens  Subjective/Objective Assessment:   Patient was admitted with CVA. Lives at home with family     Action/Plan:   Will follow for discharge needs pending PT/OT evals and physician orders.   Anticipated DC Date:  08/20/2014   Anticipated DC Plan:  [place]  CM consult      Choice offered to / List presented to:             Status of service:  Completed, signed off Medicare Important Message given?  NA - LOS <3 / Initial given by admissions (If response is "NO", the following Medicare IM given date fields will be blank) Date Medicare IM given:   Medicare IM given by:   Date Additional Medicare IM given:   Additional Medicare IM given by:    Discharge Disposition:  [place]  Per UR Regulation:  Reviewed for med. necessity/level of care/duration of stay  If discussed at [place] of Stay Meetings, dates discussed:    Comments:  08/20/14 [place], MSN, CM- Met with patient to discuss home health needs. Patient is agreeable and has chosen [facility].  This was confirmed with grandson Doctor, general practice via phone.  Marie with [facility] was notified and has accepted the referral for discharge home today. Patient will be discharging to [place].  Please use grandson [name] (or wife Lawerance Bach) as contact for appointments [phone].  Bedside RN is aware that patient's grandson will be arriving around 1600 to take patient home.

## 2014-08-20 NOTE — Discharge Summary (Signed)
Physician Discharge Summary  Patient ID: Wayne [name] MRN: [id number] DOB/AGE: [birth year] 69 y.o.  Admit date: 08/19/2014 Discharge date: 08/20/2014  Primary Care Physician:  Wayne Fendt, MD  Discharge Diagnoses:     Acute small left pontine infarct  . Nicotine abuse  . chronic FIBRILLATION, ATRIAL . Coronary atherosclerosis . Dementia . Right sided numbness with weakness  Consults: Neurology, Wayne [name]   Recommendations for Outpatient Follow-up:  Continue risk factor modification for stroke prevention, continue xarelto  2-D echo showed EF of 35-40%, patient follows Dr. Einar [name] as his cardiologist, I made him an appointment to see his cardiologist on 09/03/14  Home PT, OT, home health aide and RN was arranged  DIET: Carb modified diet    Allergies:   Allergies  Allergen Reactions  . Codeine Itching     Discharge Medications:   Medication List    STOP taking these medications        amLODipine 5 MG tablet  Commonly known as:  NORVASC      TAKE these medications        atorvastatin 80 MG tablet  Commonly known as:  LIPITOR  Take 80 mg by mouth daily.     carvedilol 3.125 MG tablet  Commonly known as:  COREG  Take 1 tablet (3.125 mg total) by mouth 2 (two) times daily with a meal.     cloNIDine 0.1 MG tablet  Commonly known as:  CATAPRES  Take 0.1 mg by mouth daily.     cyclobenzaprine 10 MG tablet  Commonly known as:  FLEXERIL  Take 10 mg by mouth daily as needed for muscle spasms.     donepezil 5 MG tablet  Commonly known as:  ARICEPT  Take 1 tablet by mouth daily.     DULoxetine 30 MG capsule  Commonly known as:  CYMBALTA  Take 60 mg by mouth daily with breakfast.     fluticasone 110 MCG/ACT inhaler  Commonly known as:  FLOVENT HFA  Inhale 2 puffs into the lungs daily as needed.     gabapentin 300 MG capsule  Commonly known as:  NEURONTIN  Take 300-600 mg by mouth 2 (two) times daily. Takes 600mg  in the morning and 300mg  at bedtime      glimepiride 2 MG tablet  Commonly known as:  AMARYL  Take 2 mg by mouth daily.     latanoprost 0.005 % ophthalmic solution  Commonly known as:  XALATAN  Place 1 drop into both eyes at bedtime.     lisinopril 5 MG tablet  Commonly known as:  PRINIVIL,ZESTRIL  Take 5 mg by mouth daily.     LOVAZA 1 G capsule  Generic drug:  omega-3 acid ethyl esters  Take 2 g by mouth 2 (two) times daily.     nicotine 14 mg/24hr patch  Commonly known as:  NICODERM CQ - dosed in mg/24 hours  Place 1 patch (14 mg total) onto the skin daily.     oxyCODONE-acetaminophen 5-325 MG per tablet  Commonly known as:  PERCOCET/ROXICET  Take 1 tablet by mouth every 6 (six) hours as needed for severe pain.     pioglitazone 15 MG tablet  Commonly known as:  ACTOS  Take 15 mg by mouth daily.     rivaroxaban 20 MG Tabs tablet  Commonly known as:  XARELTO  Take 20 mg by mouth daily.     SPIRIVA HANDIHALER 18 MCG inhalation capsule  Generic drug:  tiotropium  Place 18 mcg into  inhaler and inhale daily as needed.     UNABLE TO FIND  - Outpatient physical therapy  - Outpatient occupational therapy  -   - Diagnoses: Acute stroke         Brief H and P: For complete details please refer to admission H and P, but in brief Wayne [name] is a 69 y.o. male, recently diagnosed with dementia by Dr. Leta [name] 07/07/14, who has a past medical history of coronary artery disease, COPD, peripheral vascular disease, A. fib (on Xeralto) diabetes, bipolar disorder and long-term tobacco abuse. He presented to [facility] ER on 2/8 after a mechanical fall. He was evaluated and discharged. He presents to [facility] ER this morning with right sided numbness, weakness, and gait abnormality after falling out of bed on 08/18/13 AM (the patient does not recall what time this happened). Wayne [name] complains that his entire right side feels cold. From his head to his feet he feels as if he has no circulation on his right side. He  states that when he tried to walk this morning he had to drag his right foot. He denies recent illness, and previous history of such symptoms. He has been compliant in taking his medications including Xarelto. He and his wife (who is 87) recently moved in with his son after the patient was diagnosed with dementia. In the ER, CT head is negative, chest x-ray is clear, neurology was consulted.   Hospital Course:  Acute small left pontine CVA presenting with right-sided weakness and numbness symptoms improving, likely due to known atrial fibrillation CT head was negative at the time of admission. 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 No patent foramen ovale Carotid Dopplers showed 1-39% ICA stenosis PT evaluation recommended outpatient PT and supervision 24/7. I gave him the prescription for outpatient PT and arranged home health physical therapy as well. LDL 108, continue statin, on Lipitor 80 mg daily Hemoglobin A1c 6.7  Atrial fibrillation Currently rate controlled, continues xarelto  CAD/cardiomyopathy 2-D echo showed EF of 35-40% with moderate hypokinesis of anteroseptal myocardium. Patient is on ACE inhibitor, lisinopril 5 mg daily, added Coreg and statin. Discontinued Norvasc. Patient has follow-up appointment with his cardiologist on 09/03/14.  Dementia: Currently stable, continue Aricept  Nicotine use Patient given prescription for nicotine patch  COPD Currently stable, no acute wheezing, continue Spiriva, Flovent   Day of Discharge BP 148/86 mmHg  Pulse 82  Temp(Src) 98 F  (36.7 C) (Oral)  Resp 16  Ht 5\' 8"  (1.727 m)  Wt 80.74 kg (178 lb)  BMI 27.07 kg/m2  SpO2 97%  Physical Exam: General: Alert and awake oriented x3 not in any acute distress. CVS: S1-S2 clear no murmur rubs or gallops Chest: clear to auscultation bilaterally, no wheezing rales or rhonchi Abdomen: soft nontender, nondistended, normal bowel sounds Extremities: no cyanosis, clubbing or edema noted bilaterally Neuro: Cranial nerves II-XII intact, minimal right-sided weakness 4/5 upper and lower extremities, weakness of right grip   The results of significant diagnostics from this hospitalization (including imaging, microbiology, ancillary and laboratory) are listed below for reference.    LAB RESULTS: Basic Metabolic Panel:  Recent Labs  Lab 08/19/14 0847  NA 138  K 3.9  CL 102  CO2 24  GLUCOSE 140*  BUN 12  CREATININE 0.72  CALCIUM 9.1   Liver Function Tests: No results for input(s): AST, ALT, ALKPHOS, BILITOT, PROT, ALBUMIN in the last 168 hours. No results for input(s): LIPASE, AMYLASE in the last 168 hours. No results for input(s): AMMONIA in the last 168 hours. CBC:  Recent Labs Lab 08/19/14 0847  WBC 6.4  HGB 15.6  HCT 44.9  MCV 92.6  PLT 157   Cardiac Enzymes: No results for input(s): CKTOTAL, CKMB, CKMBINDEX, TROPONINI in the last 168 hours. BNP: Invalid input(s): POCBNP CBG:  Recent Labs Lab 08/20/14 0642 08/20/14 1215  GLUCAP 101* 162*    Significant Diagnostic Studies:  Dg Chest 2 View  08/19/2014   CLINICAL DATA:  Cough, right-sided weakness.  EXAM: CHEST  2 VIEW  COMPARISON:  February 03, 2014.  FINDINGS: Stable cardiomegaly. Status post coronary artery bypass graft. No pneumothorax or pleural effusion is noted. Both lungs are clear. The visualized skeletal structures are unremarkable.  IMPRESSION: No acute cardiopulmonary abnormality seen.   Electronically Signed   By: Marijo Conception, M.D.   On: 08/19/2014 09:33   Ct Head Wo Contrast  08/19/2014    CLINICAL DATA:  Right-sided numbness.  The patient fell yesterday.  EXAM: CT HEAD WITHOUT CONTRAST  TECHNIQUE: Contiguous axial images were obtained from the base of the skull through the vertex without intravenous contrast.  COMPARISON:  01/19/2014  FINDINGS: There is no acute intracranial hemorrhage, acute infarction, or mass lesion. There is slight diffuse atrophy consistent with age. There is a small amount of air at the confluence of the straight sinus and the transverse sinuses. This is probably due to the IV.  No osseous abnormality.  IMPRESSION: No significant abnormality.   Electronically Signed   By: Lorriane Shire M.D.   On: 08/19/2014 09:34   Mr Brain Wo Contrast  08/19/2014   CLINICAL DATA:  New onset right face, arm, and leg numbness and weakness beginning 2 days ago. Current history of atrial fibrillation on Xarelto.  EXAM: MRI HEAD WITHOUT CONTRAST  MRA HEAD WITHOUT CONTRAST  TECHNIQUE: Multiplanar, multiecho pulse sequences of the brain and surrounding structures were obtained without intravenous contrast. Angiographic images of the head were obtained using MRA technique without contrast.  COMPARISON:  Head CT 08/19/2014  FINDINGS: MRI HEAD FINDINGS  There is a 5 mm acute infarct in the left pons. There is no evidence of intracranial hemorrhage, mass, midline shift, or extra-axial fluid collection. There is mild generalized cerebral atrophy. Foci of T2 hyperintensity in the subcortical and deep cerebral white matter bilaterally are nonspecific but compatible with moderate chronic small vessel ischemic disease.  Orbits are unremarkable. Mild right maxillary sinus and minimal left ethmoid air cell mucosal thickening is noted. Mastoid air cells are clear. Major intracranial vascular flow voids are preserved. A small right TMJ effusion is noted.  MRA HEAD FINDINGS  The visualized distal vertebral arteries are patent with the right being dominant. There is irregularity and mild narrowing of the  distal right vertebral artery. The distal left vertebral artery is irregular and moderately narrowed. PICA origins are grossly patent. 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  SCAs are patent and diffusely irregular, moderate to severe on the right and mild to moderate on the left. There are fetal type origins of the  PCAs. No proximal PCA stenosis is identified, however there is moderate irregularity and attenuation of PCA branch vessels, right more so than left.  Internal carotid arteries are patent from skullbase to carotid termini. There is mild carotid siphon irregularity bilaterally without significant stenosis. ACAs are unremarkable. M1 segments are patent without stenosis. No major MCA branch vessel occlusion is identified, however there is moderate branch vessel irregularity bilaterally. No intracranial aneurysm is identified.  IMPRESSION: 1. Small acute left pontine infarct. 2. Moderate chronic small vessel ischemic disease. 3. No major intracranial arterial occlusion. 4. 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   Electronically Signed   By: Logan Bores   On: 08/19/2014 14:59   Mr Jodene Nam Head/brain Wo Cm  08/19/2014   CLINICAL DATA:  New onset right face, arm, and leg numbness and weakness beginning 2 days ago. Current history of atrial fibrillation on Xarelto.  EXAM: MRI HEAD WITHOUT CONTRAST  MRA HEAD WITHOUT CONTRAST  TECHNIQUE: Multiplanar, multiecho pulse sequences of the brain and surrounding structures were obtained without intravenous contrast. Angiographic images of the head were obtained using MRA technique without contrast.  COMPARISON:  Head CT 08/19/2014  FINDINGS: MRI HEAD FINDINGS  There is a 5 mm acute infarct in the left pons. There is no evidence of intracranial hemorrhage, mass, midline  shift, or extra-axial fluid collection. There is mild generalized cerebral atrophy. Foci of T2 hyperintensity in the subcortical and deep cerebral white matter bilaterally are nonspecific but compatible with moderate chronic small vessel ischemic disease.  Orbits are unremarkable. Mild right maxillary sinus and minimal left ethmoid air cell mucosal thickening is noted. Mastoid air cells are clear. Major intracranial vascular flow voids are preserved. A small right TMJ effusion is noted.  MRA HEAD FINDINGS  The visualized distal vertebral arteries are patent with the right being dominant. There is irregularity and mild narrowing of the distal right vertebral artery. The distal left vertebral artery is irregular and moderately narrowed. PICA origins are grossly patent. 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  SCAs are patent and diffusely irregular, moderate to severe on the right and mild to moderate on the left. There are fetal type origins of the PCAs. No proximal PCA stenosis is identified, however there is moderate irregularity and attenuation of PCA branch vessels, right more so than left.  Internal carotid arteries are patent from skullbase to carotid termini. There is mild carotid siphon irregularity bilaterally without significant stenosis. ACAs are unremarkable. M1 segments are patent without stenosis. No major MCA branch vessel occlusion is identified, however there is moderate branch vessel irregularity bilaterally. No intracranial aneurysm is identified.  IMPRESSION: 1. Small acute left pontine infarct. 2. Moderate chronic small vessel ischemic disease. 3. No major intracranial arterial occlusion. 4. 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   Electronically Signed   By: Logan Bores   On: 08/19/2014 14:59     2D ECHO: Study Conclusions  - Left ventricle: The cavity size was normal. Wall thickness was increased in a pattern of mild LVH. Systolic function was moderately reduced. The estimated ejection fraction was in the range of 35% to 40%. There is moderate hypokinesis of the anteroseptal myocardium. - Left atrium: The atrium was mildly dilated. - Right atrium: The atrium was mildly dilated. -  Atrial septum: No defect or patent foramen ovale was identified.   Disposition and Follow-up: Discharge Instructions    Diet - low sodium heart healthy    Complete by:  As directed      Diet Carb Modified    Complete by:  As directed      Increase activity slowly    Complete by:  As directed             DISPOSITION: home with PT/OT, HHA, home RN   DISCHARGE FOLLOW-UP Follow-up Information    Follow up with Laverda Page, MD On 09/03/2014.   Specialty:  Cardiology   Why:  at 1:00 PM for hospital follow-up   Contact information:   [place] [address] [phone]       Follow up with Wayne Fendt, MD. Schedule an appointment as soon as possible for a visit in 10 days.   Specialty:  Internal Medicine   Why:  for hospital follow-up   Contact information:   [place] [address] [phone]       Follow up with PENUMALLI,VIKRAM, MD. Schedule an appointment as soon as possible for a visit in 4 weeks.   Specialties:  Neurology, Radiology   Why:  for hospital follow-up   Contact information:   [address] [place] [address] [phone]        Time spent on Discharge: 40 minutes  Signed:   RAI,RIPUDEEP M.D. [facility] 08/20/2014, 1:45 PM Pager: [phone]

## 2014-08-20 NOTE — Evaluation (Signed)
Speech Language Pathology Evaluation Patient Details Name: Wayne [name] MRN: [id number] DOB: [birth year] Today's Date: 08/20/2014 Time: 0155-0225 SLP Time Calculation (min) (ACUTE ONLY): 30 min  Problem List:  Patient Active Problem List   Diagnosis Date Noted  . Cerebral thrombosis with cerebral infarction 08/20/2014  . Numbness on right side 08/19/2014  . Dementia 08/19/2014  . Right sided weakness 08/19/2014  . COPD, moderate 06/07/2014  . Smoking 06/07/2014  . Nodule of right lung 03/03/2014  . Chest pain 05/15/2012  . Shoulder pain 05/15/2012  . RECTAL BLEEDING, HX OF 10/03/2010  . SEBORRHEIC KERATOSIS 07/11/2010  . FACIAL PAIN 04/06/2010  . ACTINIC KERATOSIS, HEAD 05/12/2009  . NEOPLASM OF UNCERTAIN BEHAVIOR OF SKIN 02/04/2009  . DEPRESSION 02/04/2009  . OSTEOARTHRITIS, SHOULDER, LEFT 09/17/2008  . DEGENERATIVE DISC DISEASE, CERVICAL SPINE 09/17/2008  . DIABETES MELLITUS, TYPE II, UNCONTROLLED 11/14/2007  . ESSENTIAL HYPERTENSION 11/14/2007  . CARPAL TUNNEL SYNDROME, RIGHT 08/07/2007  . COPD 08/07/2007  . CARPAL TUNNEL RELEASE, LEFT, HX OF 08/07/2007  . BRONCHITIS 06/13/2007  . DYSLIPIDEMIA 03/24/2007  . NICOTINE ADDICTION 03/24/2007  . Coronary atherosclerosis 03/24/2007  . DISEASE, ISCHEMIC HEART, CHRONIC NEC 03/24/2007  . FIBRILLATION, ATRIAL 03/24/2007  . Unspecified Peripheral Vascular Disease 03/24/2007  . PERCUTANEOUS TRANSLUMINAL CORONARY ANGIOPLASTY, HX OF 10/04/2003   Past Medical History:  Past Medical History  Diagnosis Date  . Chronic ischemic heart disease   . Atrial fibrillation   . Peripheral vascular disease   . Hypertension   . CAD (coronary artery disease)   . Hyperlipidemia   . Depression   . Actinic keratosis   . Degeneration of cervical intervertebral disc   . Osteoarthrosis, unspecified whether generalized or localized, shoulder region   . Other disorder of muscle, ligament, and fascia   . Carpal tunnel syndrome   . COPD (chronic  obstructive pulmonary disease)   . Myocardial infarction   . Shortness of breath   . Diabetes mellitus     type 2   Past Surgical History:  Past Surgical History  Procedure Laterality Date  . Coronary artery bypass graft    . Coronary angioplasty    . Carpal tunnel release    . Finger amputation     HPI:  Pt is a 69 y.o. male, recently diagnosed with dementia by Dr. Leta Baptist 07/07/14, who has a past medical history of coronary artery disease, COPD, peripheral vascular disease, A. fib (on Xeralto) diabetes, bipolar disorder and long-term tobacco abuse. He presented to [facility] ER on 2/8 after a mechanical fall. He was evaluated and discharged. He presents to [facility] ER on 08/19/10 with right sided numbness, weakness, and gait abnormality after falling out of bed on 08/18/13 AM (the patient does not recall what time this happened).   Assessment / Plan / Recommendation Clinical Impression   Pt able to follow simple directions and answer basic information related to self/situation with MRI head on 08/19/13 indicating small acute left pontine infarct; pt able to recall words with a delay with 75% accuracy, but he does have a new dx of dementia, so naming and memory will be affected at baseline functioning.  Pt replied that he "has difficulty remembering things" at home; he was able to indicate he needs a walker now and he cannot get out of the chair because he has an alarm at present time, so he does have basic insight for some functional activities.  Pt able to express himself verbally with basic information, but this decreases with complex  information.  Pt does have a 9th grade education which affects cognitive assessment and without family available to voice baseline functioning status, it is difficulty to determine if pt is fully functioning at baseline.  No f/u indicated for ST at this time due to probable baseline cognitive functioning given education level and new dx of dementia.      SLP  Assessment  Patient does not need any further Speech Language Pathology Services    Follow Up Recommendations  None    Frequency and Duration   n/a     Pertinent Vitals/Pain Pain Assessment: No/denies pain   SLP Goals   n/a  SLP Evaluation Prior Functioning  Cognitive/Linguistic Baseline: Baseline deficits Baseline deficit details: Recent dx of dementia 12/15 Type of Home: House  Lives With: Spouse Available Help at Discharge: Family;Available 24 hours/day Education: 9th grade education Vocation: Retired   Associate Professor  Overall Cognitive Status: No family/caregiver present to determine baseline cognitive functioning Arousal/Alertness: Awake/alert Orientation Level: Oriented X4 Memory: Impaired Memory Impairment: Storage deficit;Retrieval deficit;Decreased short term memory Decreased Short Term Memory: Verbal complex;Functional complex Awareness: Appears intact Safety/Judgment: Appears intact    Comprehension  Auditory Comprehension Overall Auditory Comprehension: Appears within functional limits for tasks assessed Yes/No Questions: Within Functional Limits Commands: Within Functional Limits (simple directives) Conversation: Simple Visual Recognition/Discrimination Discrimination: Not tested Reading Comprehension Reading Status: Not tested    Expression Expression Primary Mode of Expression: Verbal Verbal Expression Overall Verbal Expression: Appears within functional limits for tasks assessed Level of Generative/Spontaneous Verbalization: Conversation Naming: Impairment Responsive: 51-75% accurate Confrontation: Impaired Convergent: 50-74% accurate Divergent: 50-74% accurate Pragmatics: No impairment Non-Verbal Means of Communication: Not applicable Written Expression Dominant Hand: Right Written Expression: Not tested   Oral / Motor Oral Motor/Sensory Function Overall Oral Motor/Sensory Function: Appears within functional limits for tasks assessed Labial  ROM: Within Functional Limits Labial Symmetry: Within Functional Limits Labial Strength: Within Functional Limits Labial Sensation: Within Functional Limits Lingual ROM: Within Functional Limits Lingual Symmetry: Within Functional Limits Lingual Strength: Within Functional Limits Lingual Sensation: Within Functional Limits Facial ROM: Within Functional Limits Facial Symmetry: Within Functional Limits Facial Strength: Within Functional Limits Facial Sensation: Within Functional Limits Velum: Within Functional Limits Mandible: Within Functional Limits Motor Speech Overall Motor Speech: Appears within functional limits for tasks assessed Respiration: Within functional limits Phonation: Normal Resonance: Within functional limits Articulation: Within functional limitis Intelligibility: Intelligible Motor Planning: Witnin functional limits Motor Speech Errors: Not applicable Interfering Components: Premorbid status        ADAMS,PAT, M.S., CCC-SLP 08/20/2014, 2:57 PM

## 2014-08-20 NOTE — Progress Notes (Signed)
Error in note, patient not d/c yet. Note typed in error.

## 2014-08-20 NOTE — Progress Notes (Signed)
STROKE TEAM PROGRESS NOTE   HISTORY Wayne [name] is an 69 y.o. male who smokes 1/2 pk per day, has Afib on Xarelto, diabetes and HTN. Patient went to sleep 08/17/2014 (LKW time unknown) and when he awoke the next morning at 0800 he states he fell out of his bed. He noted he was weak in the right leg and felt " he had no circulation in the right face, arm and leg and this area just felt funny". Patient was brought to the ED by family for further evaluation. Currently he is no distress, but endorses decreased sensation in the right arm and lag. HE states he has taken his Xarelto as prescribed. Patient was not administered TPA secondary to delay in arrival. He was admitted for further evaluation and treatment.   SUBJECTIVE (INTERVAL HISTORY) His  Grand son is at the bedside.  Overall he feels his condition is gradually improving.     OBJECTIVE Temp:  [97.3 F (36.3 C)-98 F (36.7 C)] 97.3 F (36.3 C) (02/12 0630) Pulse Rate:  [35-91] 90 (02/12 0630) Cardiac Rhythm:  [-] Atrial fibrillation (02/12 0800) Resp:  [14-19] 19 (02/12 0630) BP: (123-181)/(68-122) 151/87 mmHg (02/12 0630) SpO2:  [94 %-99 %] 97 % (02/12 0630)   Recent Labs Lab 08/19/14 2246 08/20/14 0642  GLUCAP 173* 101*    Recent Labs Lab 08/19/14 0847  NA 138  K 3.9  CL 102  CO2 24  GLUCOSE 140*  BUN 12  CREATININE 0.72  CALCIUM 9.1   No results for input(s): AST, ALT, ALKPHOS, BILITOT, PROT, ALBUMIN in the last 168 hours.  Recent Labs Lab 08/19/14 0847  WBC 6.4  HGB 15.6  HCT 44.9  MCV 92.6  PLT 157   No results for input(s): CKTOTAL, CKMB, CKMBINDEX, TROPONINI in the last 168 hours.  Recent Labs  08/19/14 0847  LABPROT 14.3  INR 1.10    Recent Labs  08/19/14 0925  COLORURINE YELLOW  LABSPEC 1.017  PHURINE 8.0  GLUCOSEU NEGATIVE  HGBUR NEGATIVE  BILIRUBINUR NEGATIVE  KETONESUR NEGATIVE  PROTEINUR NEGATIVE  UROBILINOGEN 2.0*  NITRITE NEGATIVE  LEUKOCYTESUR NEGATIVE        Component Value Date/Time   CHOL 156 08/20/2014 0514   TRIG 59 08/20/2014 0514   HDL 36* 08/20/2014 0514   CHOLHDL 4.3 08/20/2014 0514   VLDL 12 08/20/2014 0514   LDLCALC 108* 08/20/2014 0514   Lab Results  Component Value Date   HGBA1C 6.7* 08/19/2014      Component Value Date/Time   LABOPIA NONE DETECTED 08/19/2014 0924   COCAINSCRNUR NONE DETECTED 08/19/2014 0924   LABBENZ NONE DETECTED 08/19/2014 0924   AMPHETMU NONE DETECTED 08/19/2014 0924   THCU NONE DETECTED 08/19/2014 0924   LABBARB NONE DETECTED 08/19/2014 0924    No results for input(s): ETH in the last 168 hours.  Dg Chest 2 View  08/19/2014   CLINICAL DATA:  Cough, right-sided weakness.  EXAM: CHEST  2 VIEW  COMPARISON:  February 03, 2014.  FINDINGS: Stable cardiomegaly. Status post coronary artery bypass graft. No pneumothorax or pleural effusion is noted. Both lungs are clear. The visualized skeletal structures are unremarkable.  IMPRESSION: No acute cardiopulmonary abnormality seen.   Electronically Signed   By: Marijo Conception, M.D.   On: 08/19/2014 09:33   Ct Head Wo Contrast  08/19/2014   CLINICAL DATA:  Right-sided numbness.  The patient fell yesterday.  EXAM: CT HEAD WITHOUT CONTRAST  TECHNIQUE: Contiguous axial images were obtained from the base  of the skull through the vertex without intravenous contrast.  COMPARISON:  01/19/2014  FINDINGS: There is no acute intracranial hemorrhage, acute infarction, or mass lesion. There is slight diffuse atrophy consistent with age. There is a small amount of air at the confluence of the straight sinus and the transverse sinuses. This is probably due to the IV.  No osseous abnormality.  IMPRESSION: No significant abnormality.   Electronically Signed   By: Lorriane Shire M.D.   On: 08/19/2014 09:34   Mr Brain Wo Contrast  08/19/2014   CLINICAL DATA:  New onset right face, arm, and leg numbness and weakness beginning 2 days ago. Current history of atrial fibrillation on Xarelto.   EXAM: MRI HEAD WITHOUT CONTRAST  MRA HEAD WITHOUT CONTRAST  TECHNIQUE: Multiplanar, multiecho pulse sequences of the brain and surrounding structures were obtained without intravenous contrast. Angiographic images of the head were obtained using MRA technique without contrast.  COMPARISON:  Head CT 08/19/2014  FINDINGS: MRI HEAD FINDINGS  There is a 5 mm acute infarct in the left pons. There is no evidence of intracranial hemorrhage, mass, midline shift, or extra-axial fluid collection. There is mild generalized cerebral atrophy. Foci of T2 hyperintensity in the subcortical and deep cerebral white matter bilaterally are nonspecific but compatible with moderate chronic small vessel ischemic disease.  Orbits are unremarkable. Mild right maxillary sinus and minimal left ethmoid air cell mucosal thickening is noted. Mastoid air cells are clear. Major intracranial vascular flow voids are preserved. A small right TMJ effusion is noted.  MRA HEAD FINDINGS  The visualized distal vertebral arteries are patent with the right being dominant. There is irregularity and mild narrowing of the distal right vertebral artery. The distal left vertebral artery is irregular and moderately narrowed. PICA origins are grossly patent. 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  SCAs are patent and diffusely irregular, moderate to severe on the right and mild to moderate on the left. There are fetal type origins of the PCAs. No proximal PCA stenosis is identified, however there is moderate irregularity and attenuation of PCA branch vessels, right more so than left.  Internal carotid arteries are patent from skullbase to carotid termini. There is mild carotid siphon irregularity bilaterally without significant stenosis. ACAs are unremarkable. M1 segments are patent without stenosis. No major MCA branch vessel occlusion is identified, however there  is moderate branch vessel irregularity bilaterally. No intracranial aneurysm is identified.  IMPRESSION: 1. Small acute left pontine infarct. 2. Moderate chronic small vessel ischemic disease. 3. No major intracranial arterial occlusion. 4. 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   Electronically Signed   By: Logan Bores   On: 08/19/2014 14:59   Mr Jodene Nam Head/brain Wo Cm  08/19/2014   CLINICAL DATA:  New onset right face, arm, and leg numbness and weakness beginning 2 days ago. Current history of atrial fibrillation on Xarelto.  EXAM: MRI HEAD WITHOUT CONTRAST  MRA HEAD WITHOUT CONTRAST  TECHNIQUE: Multiplanar, multiecho pulse sequences of the brain and surrounding structures were obtained without intravenous contrast. Angiographic images of the head were obtained using MRA technique without contrast.  COMPARISON:  Head CT 08/19/2014  FINDINGS: MRI HEAD FINDINGS  There is a 5 mm acute infarct in the left pons. There is no evidence of intracranial hemorrhage, mass, midline shift, or extra-axial fluid collection. There is  mild generalized cerebral atrophy. Foci of T2 hyperintensity in the subcortical and deep cerebral white matter bilaterally are nonspecific but compatible with moderate chronic small vessel ischemic disease.  Orbits are unremarkable. Mild right maxillary sinus and minimal left ethmoid air cell mucosal thickening is noted. Mastoid air cells are clear. Major intracranial vascular flow voids are preserved. A small right TMJ effusion is noted.  MRA HEAD FINDINGS  The visualized distal vertebral arteries are patent with the right being dominant. There is irregularity and mild narrowing of the distal right vertebral artery. The distal left vertebral artery is irregular and moderately narrowed. PICA origins are grossly patent. 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  SCAs are patent and diffusely irregular, moderate to severe on the right and mild to moderate on the left. There are fetal type origins of the PCAs. No proximal PCA stenosis is identified, however there is moderate irregularity and attenuation of PCA branch vessels, right more so than left.  Internal carotid arteries are patent from skullbase to carotid termini. There is mild carotid siphon irregularity bilaterally without significant stenosis. ACAs are unremarkable. M1 segments are patent without stenosis. No major MCA branch vessel occlusion is identified, however there is moderate branch vessel irregularity bilaterally. No intracranial aneurysm is identified.  IMPRESSION: 1. Small acute left pontine infarct. 2. Moderate chronic small vessel ischemic disease. 3. No major intracranial arterial occlusion. 4. 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   Electronically Signed   By: Logan Bores   On: 08/19/2014 14:59   Carotid Doppler  There is 1-39% bilateral ICA stenosis. Vertebral artery flow is antegrade.     PHYSICAL EXAM Frail elderly Caucasian male not in distress. . Afebrile. Head is nontraumatic. Neck is supple without bruit.    Cardiac exam no murmur or gallop. Lungs are clear to auscultation. Distal pulses are well felt. Neurological Exam : Awake alert oriented 2. Dementia tension, registration and recall. Animal naming 5 only. Recall 0/3. Able to name and repeat quite well. Extraocular movements are full range without nystagmus. Mild saccadic dysmetria to the right. Fundi were not visualized. Vision acuity seems adequate. Mild right lower facial asymmetry. Tongue midline. Good cough and gag. Motor system exam mild right upper and lower extremity drift. 4/5 strength on the right side. Weakness of right grip and intrinsic hand muscles  and right hip flexors. Impaired finger-to-nose and knee to heel coordination on the right side. Plantars are downgoing. Deep tendon flexors are symmetric. Sensation appears preserved. Gait was not tested. ASSESSMENT/PLAN Mr. Wayne [name] is a 69 y.o. male with history of coronary artery disease, COPD, peripheral vascular disease, A. fib (on Xarelto), diabetes, bipolar disorder and long-term tobacco abuse presenting with new onset right face, arm and leg numbness and weakness 2 days ago. He did not receive IV t-PA due to delay in arrival.   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  Resultant  Right face/arm/leg weakness  MRI  L pontine infarct  MRA  No significant stenosis   Carotid Doppler  No significant stenosis   2D Echo  pending   xarelto for VTE prophylaxis  Diet heart healthy/carb modified thin liquids  xarelto ( rivaroxaban) prior to admission, now on xarelto ( rivaroxaban)  Ongoing aggressive stroke risk factor management  Therapy recommendations:  pending  Disposition:  pending   Atrial Fibrillation  Home meds:  xarelto, continued in the hospital  Continue xarelto at discharge   Hypertension  Elevated BP 123-181/75-122 past 24h (08/20/2014 @ 9:50 AM)   Hyperlipidemia  Home meds:  lipitor 80 and lovaza,  resumed in hospital  LDL 108, goal < 70  Continue statin at discharge  Diabetes  HgbA1c 6.7, goal < 7.0  Controlled  Other Stroke Risk Factors  Advanced age  Cigarette smoker, advised to stop smoking  Coronary artery disease - ischemic heart diease, MI, CABG, angioplasty  PVD  Other Active Problems  Dementia, on aricept (recent dx by Dr. Leta Baptist)  COPD  Bipolar disorder  Hospital day # 1  I have personally examined this patient, reviewed notes, independently viewed imaging studies, participated in medical decision making and plan of care. I have made any additions or clarifications directly to  the above note. He has been mild right hemiparesis due to pontine infarct etiology likely small vessel disease though a small emboli due to Xarelto failure with atrial fibrillation cannot entirely be ruled out. He remains at recurrent risk for strokes and TIAs and needs ongoing stroke evaluation. Outpatient follow-up for dementia with Dr. Dutch Quint, [place] Pager: [phone] 08/20/2014 1:41 PM     To contact Stroke Continuity provider, please refer to [email or web address] After hours, contact General Neurology

## 2014-08-20 NOTE — Progress Notes (Signed)
*  PRELIMINARY RESULTS* Vascular Ultrasound Carotid Duplex (Doppler) has been completed.  Preliminary findings: Bilateral:  1-39% ICA stenosis.  Vertebral artery flow is antegrade.      Landry Mellow, RDMS, RVT  08/20/2014, 8:53 AM

## 2014-08-20 NOTE — Progress Notes (Signed)
Patient's son requested to speak with the MD. MD paged.

## 2014-08-20 NOTE — Progress Notes (Signed)
UR complete.  Brandi Armato RN, MSN 

## 2014-08-24 ENCOUNTER — Telehealth: Payer: Self-pay | Admitting: Internal Medicine

## 2014-08-24 NOTE — Telephone Encounter (Signed)
Called and spoke to pt's son, [name]. [name] is requesting we go ahead and schedule the pt's CT scan and appt so he is able to take pt to appt's. Pt is to have CT scan in 10/2014 and appt to follow. Pt currently has appt with TP on 4/25 at 3:30pm. [name] is requesting him to be called for the appt time and date at [phone].  PCC's please schedule pt's CT scan in 10/2014 before appt with TP. Thanks.

## 2014-08-24 NOTE — Telephone Encounter (Signed)
Called and spoke with Wayne [name] and scheduled CT Chest for Monday 10/25/14 at 11:00 at [facility]. Pt to arrive at 10:45. No prep. Pt may eat breakfast and take meds. Pt still has same insurance. No pre cert required. Wayne [name] is aware of CT Chest appointment date, time and location. Nothing else needed at this time. Rhonda J Cobb

## 2014-09-22 ENCOUNTER — Ambulatory Visit (INDEPENDENT_AMBULATORY_CARE_PROVIDER_SITE_OTHER): Payer: Medicare Other | Admitting: Neurology

## 2014-09-22 ENCOUNTER — Encounter: Payer: Self-pay | Admitting: Neurology

## 2014-09-22 VITALS — BP 122/78 | HR 70 | Ht 68.0 in | Wt 178.2 lb

## 2014-09-22 DIAGNOSIS — Z8673 Personal history of transient ischemic attack (TIA), and cerebral infarction without residual deficits: Secondary | ICD-10-CM

## 2014-09-22 DIAGNOSIS — I6330 Cerebral infarction due to thrombosis of unspecified cerebral artery: Secondary | ICD-10-CM

## 2014-09-22 MED ORDER — DONEPEZIL HCL 23 MG PO TABS: 23.0000 mg | ORAL_TABLET | Freq: Every day | ORAL | Status: DC
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
## 2014-09-22 NOTE — Patient Instructions (Signed)
I had a long d/w patient about his recent stroke, risk for recurrent stroke/TIAs, personally independently reviewed imaging studies and stroke evaluation results and answered questions.Continue xarelto  for secondary stroke prevention  Given h/o atrial fibrillation and maintain strict control of hypertension with blood pressure goal below 130/90, diabetes with hemoglobin A1c goal below 6.5% and lipids with LDL cholesterol goal below 100 mg/dL. Increase Aricept to 23 mg daily to help with his dementia and cognitive impairment. I have discussed side effects with the patient and grandson and advised him to call me if needed. Keep upcoming appointment with Dr. Bethann Berkshire next month. No follow-up with me is necessary.   .Stroke Prevention Some medical conditions and behaviors are associated with an increased chance of having a stroke. You may prevent a stroke by making healthy choices and managing medical conditions. HOW CAN I REDUCE MY RISK OF HAVING A STROKE?   Stay physically active. Get at least 30 minutes of activity on most or all days.  Do not smoke. It may also be helpful to avoid exposure to secondhand smoke.  Limit alcohol use. Moderate alcohol use is considered to be:  No more than 2 drinks per day for men.  No more than 1 drink per day for nonpregnant women.  Eat healthy foods. This involves:  Eating 5 or more servings of fruits and vegetables a day.  Making dietary changes that address high blood pressure (hypertension), high cholesterol, diabetes, or obesity.  Manage your cholesterol levels.  Making food choices that are high in fiber and low in saturated fat, trans fat, and cholesterol may control cholesterol levels.  Take any prescribed medicines to control cholesterol as directed by your health care provider.  Manage your diabetes.  Controlling your carbohydrate and sugar intake is recommended to manage diabetes.  Take any prescribed medicines to control diabetes as  directed by your health care provider.  Control your hypertension.  Making food choices that are low in salt (sodium), saturated fat, trans fat, and cholesterol is recommended to manage hypertension.  Take any prescribed medicines to control hypertension as directed by your health care provider.  Maintain a healthy weight.  Reducing calorie intake and making food choices that are low in sodium, saturated fat, trans fat, and cholesterol are recommended to manage weight.  Stop drug abuse.  Avoid taking birth control pills.  Talk to your health care provider about the risks of taking birth control pills if you are over 8 years old, smoke, get migraines, or have ever had a blood clot.  Get evaluated for sleep disorders (sleep apnea).  Talk to your health care provider about getting a sleep evaluation if you snore a lot or have excessive sleepiness.  Take medicines only as directed by your health care provider.  For some people, aspirin or blood thinners (anticoagulants) are helpful in reducing the risk of forming abnormal blood clots that can lead to stroke. If you have the irregular heart rhythm of atrial fibrillation, you should be on a blood thinner unless there is a good reason you cannot take them.  Understand all your medicine instructions.  Make sure that other conditions (such as anemia or atherosclerosis) are addressed. SEEK IMMEDIATE MEDICAL CARE IF:   You have sudden weakness or numbness of the face, arm, or leg, especially on one side of the body.  Your face or eyelid droops to one side.  You have sudden confusion.  You have trouble speaking (aphasia) or understanding.  You have sudden trouble seeing  in one or both eyes.  You have sudden trouble walking.  You have dizziness.  You have a loss of balance or coordination.  You have a sudden, severe headache with no known cause.  You have new chest pain or an irregular heartbeat. Any of these symptoms may  represent a serious problem that is an emergency. Do not wait to see if the symptoms will go away. Get medical help at once. Call your local emergency services (911 in [place] Do not drive yourself to the hospital. Document Released: 08/02/2004 Document Revised: 11/09/2013 Document Reviewed: 12/26/2012 [facility] Patient Information 2015 [place], [place]. This information is not intended to replace advice given to you by your health care provider. Make sure you discuss any questions you have with your health care provider.

## 2014-10-06 ENCOUNTER — Ambulatory Visit (INDEPENDENT_AMBULATORY_CARE_PROVIDER_SITE_OTHER): Payer: Medicare Other | Admitting: Nurse Practitioner

## 2014-10-06 ENCOUNTER — Encounter: Payer: Self-pay | Admitting: Nurse Practitioner

## 2014-10-06 VITALS — BP 115/72 | HR 51 | Ht 68.5 in | Wt 176.8 lb

## 2014-10-06 DIAGNOSIS — I10 Essential (primary) hypertension: Secondary | ICD-10-CM | POA: Diagnosis not present

## 2014-10-06 DIAGNOSIS — R531 Weakness: Secondary | ICD-10-CM

## 2014-10-06 DIAGNOSIS — F0390 Unspecified dementia without behavioral disturbance: Secondary | ICD-10-CM

## 2014-10-06 DIAGNOSIS — I6330 Cerebral infarction due to thrombosis of unspecified cerebral artery: Secondary | ICD-10-CM | POA: Diagnosis not present

## 2014-10-06 DIAGNOSIS — I481 Persistent atrial fibrillation: Secondary | ICD-10-CM | POA: Diagnosis not present

## 2014-10-06 DIAGNOSIS — I4819 Other persistent atrial fibrillation: Secondary | ICD-10-CM

## 2014-10-06 DIAGNOSIS — M6289 Other specified disorders of muscle: Secondary | ICD-10-CM | POA: Diagnosis not present

## 2014-10-06 MED ORDER — MEMANTINE HCL ER 7 & 14 & 21 &28 MG PO CP24: ORAL_CAPSULE | ORAL | Status: DC
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
## 2014-10-06 NOTE — Patient Instructions (Signed)
Continue Xarelto for secondary stroke prevention and atrial fibrillation Strict control of hypertension with goal below 130/90 Diabetes with hemoglobin A1c below 6.5 LDL cholesterol below 100 Continue Aricept 10 mg daily for dementia Given Namenda starter pack with directions Follow-up in 6 months

## 2014-10-06 NOTE — Progress Notes (Signed)
I reviewed note and agree with plan.   Penni Bombard, MD 4/38/3818, 4:03 PM Certified in Neurology, Neurophysiology and Neuroimaging  [facility] [address], [place], [place] [address] [phone]

## 2014-10-25 ENCOUNTER — Telehealth: Payer: Self-pay | Admitting: Internal Medicine

## 2014-10-25 ENCOUNTER — Inpatient Hospital Stay: Admission: RE | Admit: 2014-10-25 | Payer: Medicare Other | Source: Ambulatory Visit

## 2014-10-25 NOTE — Telephone Encounter (Signed)
Noted  

## 2014-10-28 ENCOUNTER — Telehealth: Payer: Self-pay | Admitting: Nurse Practitioner

## 2014-10-28 MED ORDER — MEMANTINE HCL ER 28 MG PO CP24: 28.0000 mg | ORAL_CAPSULE | Freq: Every day | ORAL | Status: DC

## 2014-10-28 NOTE — Telephone Encounter (Signed)
Patient calling for RX. For Namenda, used samples given to him. Wants RX sent to [facility]. Please call and advise. DW

## 2014-10-28 NOTE — Telephone Encounter (Signed)
Rx has been sent.  I called back to advise.  He is aware.

## 2014-11-01 ENCOUNTER — Encounter: Payer: Self-pay | Admitting: Adult Health

## 2014-11-01 ENCOUNTER — Ambulatory Visit: Payer: Medicare Other | Admitting: Internal Medicine

## 2014-11-01 ENCOUNTER — Ambulatory Visit (INDEPENDENT_AMBULATORY_CARE_PROVIDER_SITE_OTHER): Payer: Medicare Other | Admitting: Adult Health

## 2014-11-01 VITALS — BP 126/84 | HR 106 | Ht 68.5 in | Wt 184.0 lb

## 2014-11-01 DIAGNOSIS — R911 Solitary pulmonary nodule: Secondary | ICD-10-CM | POA: Diagnosis not present

## 2014-11-01 DIAGNOSIS — I6330 Cerebral infarction due to thrombosis of unspecified cerebral artery: Secondary | ICD-10-CM

## 2014-11-01 DIAGNOSIS — J449 Chronic obstructive pulmonary disease, unspecified: Secondary | ICD-10-CM | POA: Diagnosis not present

## 2014-11-01 DIAGNOSIS — Z23 Encounter for immunization: Secondary | ICD-10-CM | POA: Diagnosis not present

## 2014-11-01 NOTE — Addendum Note (Signed)
Addended by: Lorane Gell on: 11/01/2014 04:35 PM   Modules accepted: Orders

## 2014-11-01 NOTE — Progress Notes (Signed)
Subjective:    Patient ID: Wayne [name], male    DOB: January 25, 1946, 69 y.o.   MRN: [id number]  HPI   PCP Philis Fendt, MD   HPI  IOV 03/03/2014   Chief Complaint  Patient presents with  . Pulmonary Consult    Referred by Dr. Einar Gip for lung nodules.    69 year old male accompanied by his grandson. He is a very poor historian. History is gained by referring physician cardiologist Dr. Einar Gip notes and the grandson. Patient is a smoker with coronary artery disease.  In October 2014 review of the charts indicate that his primary care physician did a CT scan of the chest based on a chest x-ray suggesting pulmonary nodule. The CT scan of the chest showed a right lower lobe noncalcified smooth 6.5 mm nodule. Patient is unaware of this. In vegetables and 15 he had a fall from a ladder and this was noted in the trauma [place] CT protocol that showed an increase in this nodule 8.5 mm right lower lobe smooth nodule. Therefore he is being referred. Patient also denies any baseline dyspnea, cough. Prior to the fall he was extremely active mowing the yard. Since the fall he continues to have muscular skeletal pain although the body.  His coronary artery disease and according to the referral notes get a normal ejection fraction of 50% in May 2013 and appears that he had a nuclear cardiac stress test probably April 2008 that was normal the patient and his grandson and says that cardiologist has ruled out active coronary artery disease  03/08/2014 Follow up RLL lung nodule Pt is a smoker previously referred for lung nodule found on CT chest . Initial CT showed a 7 mm lung nodule in 04/2013 and CT 01/2014 showed increase in size at 8.4mm. He was set up for PFT and PET scan.  PFT 03/08/2014 showed FEV1 78%, ratio 80, +BD response, Mildly decreased DLCO 71%.  03/08/14 PET showed a RLL pulmonary nodule not sign hypermetablolic, but at low end of resolution of PET , did enlarge since 04/27/13 . Left sided rib fx were  noted-new.  We discussed these test in detail w/ pt and granddaughter.  We discussed INDI blood test for lung nodule. He was in agreement. Discussed smoking cessation he has no interest. Previously on Spiriva but does not want to take inhalers.  Says breathing overall is okay . Has daily cough . No hemoptysis, wt loss, chest pain,orthopnea , dyspnea.      OV 06/07/2014  Chief Complaint  Patient presents with  . Follow-up    Pt states his breathing is unchanged since last OV. Pt c/o intermittent dry cough. Pt denies CP/tightness and SOB.    FU Gld stabge 2 copd (not interested in inhalers), Lung nodule 56mm RLL since oct 2014 and Smoking. PResents with [facility]   Lung nodule - 32mm RLL diagnosed Oct 2014  - 03/15/14  INDI Blood test  - lung nodule probability for cancer is INDETERMINATE (done 03/15/14)  -  CT Super D 05/25/14 -  81mm RLL nodule unchnaged from Juluy 2015 and OCt 2014 - 1 year Stability +  Smoking:  - still does. REfused to quit.  COPD moderate  - mild to moderate symptoms +. Refuses inhalers. No hx of ER , urgen care visits or exacerbation   11/01/2014 Follow up : COPD/smoker  and Lung Nodule  Accompanied by his daughter. Patient presents for a three-month follow-up. Patient has a known history of COPD and unfortunately  continues to smoke. We discussed smoking cessation. However, patient says he is not interested in quitting smoking. Has been discussed with patient regarding inhalers. Patient denies any cough, wheezing, or increased shortness of breath. Does  Not want to use inhalers at this time. Patient has a known 7 mm right lower lobe lung nodule that has been stable on previous CT chest. He is due for a CT chest tomorrow. Pneumovax is up-to-date. Has not had Prevnar. He denies any hemoptysis, orthopnea, PND or increased leg swelling. Has had a recent hospitalization in February for acute stroke. Has some residual right-sided numbness.         Review of Systems    Constitutional: Negative for fever and unexpected weight change.  HENT:Negative for dental problem, ear pain, nosebleeds, postnasal drip, rhinorrhea, sinus pressure, sneezing, sore throat and trouble swallowing.   Eyes: Negative for redness and itching.  Respiratory:. Negative for chest tightness, shortness of breath and wheezing.   Cardiovascular: Negative for palpitations and leg swelling.  Gastrointestinal: Negative for nausea and vomiting.  Genitourinary: Negative for dysuria.  Musculoskeletal: Negative for joint swelling.  Skin: Negative for rash.  Neurological: Negative for headaches.  Hematological: Does not bruise/bleed easily.  Psychiatric/Behavioral: Negative for dysphoric mood. The patient is not nervous/anxious.        Objective:   Physical Exam  Constitutional: He is oriented to person, place, and time. He appears well-developed and well-nourished. No distress.  Mildly disheveled  HENT:  Head: Normocephalic and atraumatic.  Right Ear: External ear normal.  Left Ear: External ear normal.  Mouth/Throat: Oropharynx is clear and moist. No oropharyngeal exudate.  Eyes: Conjunctivae and EOM are normal. Pupils are equal, round, and reactive to light. Right eye exhibits no discharge. Left eye exhibits no discharge. No scleral icterus.  Neck: Normal range of motion. Neck supple. No JVD present. No tracheal deviation present. No thyromegaly present.  Cardiovascular: Normal rate, regular rhythm and intact distal pulses.  Exam reveals no gallop and no friction rub.   No murmur heard. Pulmonary/Chest: Effort normal and breath sounds normal. No respiratory distress. He has no wheezes. He has no rales. He exhibits no tenderness.  Abdominal: Soft. Bowel sounds are normal. He exhibits no distension and no mass. There is no tenderness. There is no rebound and no guarding.  Musculoskeletal: Normal range of motion. He exhibits no edema or tenderness.  Lymphadenopathy:    He has no cervical  adenopathy.  Neurological: He is alert and oriented to person, place, and time. He has normal reflexes. No cranial nerve deficit. Coordination normal.  Skin: Skin is warm and dry. No rash noted. He is not diaphoretic. No erythema. No pallor.  Psychiatric: He has a normal mood and affect. His behavior is normal. Judgment and thought content normal.  Poor historian

## 2014-11-01 NOTE — Assessment & Plan Note (Signed)
COPD, compensated without flare. He declines inhalers at this time . He does agree that if his breathing worsens that he will consider starting inhaler therapy. Prevnar vaccine today. Follow-up in 3-4 months with Dr. Chase Caller

## 2014-11-01 NOTE — Assessment & Plan Note (Signed)
7 mm right lower lung nodule and a smoker Follow-up CT chest tomorrow as planned. We'll need to follow out for at least 2 years.

## 2014-11-01 NOTE — Patient Instructions (Signed)
Follow-up for CT scan of your chest tomorrow as directed. Prevnar vaccine today. Continue to work on not smoking. Follow-up with Dr. Chase Caller in 3 months and as needed

## 2014-11-02 ENCOUNTER — Ambulatory Visit (INDEPENDENT_AMBULATORY_CARE_PROVIDER_SITE_OTHER)
Admission: RE | Admit: 2014-11-02 | Discharge: 2014-11-02 | Disposition: A | Discharge status: Home / Self Care | Payer: Medicare Other | Source: Ambulatory Visit | Attending: Internal Medicine | Admitting: Internal Medicine

## 2014-11-02 DIAGNOSIS — F17200 Nicotine dependence, unspecified, uncomplicated: Secondary | ICD-10-CM

## 2014-11-02 DIAGNOSIS — J449 Chronic obstructive pulmonary disease, unspecified: Secondary | ICD-10-CM | POA: Diagnosis not present

## 2014-11-02 DIAGNOSIS — Z720 Tobacco use: Secondary | ICD-10-CM

## 2014-11-02 DIAGNOSIS — R911 Solitary pulmonary nodule: Secondary | ICD-10-CM

## 2014-11-18 ENCOUNTER — Emergency Department (INDEPENDENT_AMBULATORY_CARE_PROVIDER_SITE_OTHER)
Admission: EM | Admit: 2014-11-18 | Discharge: 2014-11-18 | Disposition: A | Discharge status: Home / Self Care | Payer: Medicare Other | Source: Home / Self Care | Attending: Family Medicine | Admitting: Family Medicine

## 2014-11-18 ENCOUNTER — Encounter (HOSPITAL_COMMUNITY): Payer: Self-pay | Admitting: Emergency Medicine

## 2014-11-18 DIAGNOSIS — W57XXXA Bitten or stung by nonvenomous insect and other nonvenomous arthropods, initial encounter: Secondary | ICD-10-CM

## 2014-11-18 DIAGNOSIS — T148 Other injury of unspecified body region: Secondary | ICD-10-CM | POA: Diagnosis not present

## 2014-11-18 MED ORDER — DOXYCYCLINE HYCLATE 100 MG PO CAPS: 100.0000 mg | ORAL_CAPSULE | Freq: Two times a day (BID) | ORAL | Status: DC

## 2014-11-18 NOTE — ED Notes (Signed)
Reports possible tick bite on 5/11.     Pt triaged and assessed by provider.  Provider in before nurse.

## 2014-11-18 NOTE — ED Provider Notes (Signed)
Wayne [name] is a 69 y.o. male who presents to Urgent Care today for tick bite. Patient was bitten on his penis by attack yesterday. He notes pain and swelling of his distal penis. He denies any fevers or chills nausea vomiting or diarrhea. No treatments tried yet. He removed the tick with tweezers.   Past Medical History  Diagnosis Date  . Chronic ischemic heart disease   . Atrial fibrillation   . Peripheral vascular disease   . Hypertension   . CAD (coronary artery disease)   . Hyperlipidemia   . Depression   . Actinic keratosis   . Degeneration of cervical intervertebral disc   . Osteoarthrosis, unspecified whether generalized or localized, shoulder region   . Other disorder of muscle, ligament, and fascia   . Carpal tunnel syndrome   . COPD (chronic obstructive pulmonary disease)   . Myocardial infarction   . Shortness of breath   . Diabetes mellitus     type 2   Past Surgical History  Procedure Laterality Date  . Coronary artery bypass graft    . Coronary angioplasty    . Carpal tunnel release    . Finger amputation     History  Substance Use Topics  . Smoking status: Current Every Day Smoker -- 0.50 packs/day for 56 years    Types: Cigarettes  . Smokeless tobacco: Never Used  . Alcohol Use: No   ROS as above Medications: No current facility-administered medications for this encounter.   Current Outpatient Prescriptions  Medication Sig Dispense Refill  . atorvastatin (LIPITOR) 80 MG tablet Take 80 mg by mouth daily.      . carvedilol (COREG) 3.125 MG tablet Take 1 tablet (3.125 mg total) by mouth 2 (two) times daily with a meal. 60 tablet 3  . cloNIDine (CATAPRES) 0.1 MG tablet Take 0.1 mg by mouth daily.    . DULoxetine (CYMBALTA) 30 MG capsule Take 60 mg by mouth daily with breakfast.     . gabapentin (NEURONTIN) 300 MG capsule Take 300 mg by mouth 2 (two) times daily.  5  . glimepiride (AMARYL) 2 MG tablet Take 2 mg by mouth daily.     [name] latanoprost (XALATAN)  0.005 % ophthalmic solution Place 1 drop into both eyes at bedtime.  5  . lisinopril (PRINIVIL,ZESTRIL) 5 MG tablet Take 5 mg by mouth daily.    . memantine (NAMENDA XR) 28 MG CP24 24 hr capsule Take 1 capsule (28 mg total) by mouth daily. 30 capsule 3  . methocarbamol (ROBAXIN) 500 MG tablet Take 500 mg by mouth as needed.  2  . rivaroxaban (XARELTO) 20 MG TABS tablet Take 20 mg by mouth daily.     . cyclobenzaprine (FLEXERIL) 10 MG tablet Take 10 mg by mouth daily as needed for muscle spasms.   5  . doxycycline (VIBRAMYCIN) 100 MG capsule Take 1 capsule (100 mg total) by mouth 2 (two) times daily. 20 capsule 0  . traMADol (ULTRAM) 50 MG tablet Take 50 mg by mouth daily as needed.  2   Allergies  Allergen Reactions  . Codeine Itching     Exam:  BP 166/99 mmHg  Pulse 81  Temp(Src) 97.6 F (36.4 C) (Oral)  Resp 22  SpO2 95% Gen: Well NAD Skin: Small erythematous area on the right shaft of the penis. Swollen shaft and foreskin present. Nontender.  No results found for this or any previous visit (from the past 24 hour(s)). No results found.  Assessment and  Plan: 69 y.o. male with tick bite. Treat with doxycycline. Return as needed.  Discussed warning signs or symptoms. Please see discharge instructions. Patient expresses understanding.     Gregor Hams, MD 11/18/14 [phone]

## 2014-11-18 NOTE — Discharge Instructions (Signed)
Thank you for coming in today.  Tick Bite Information Ticks are insects that attach themselves to the skin. There are many types of ticks. Common types include wood ticks and deer ticks. Sometimes, ticks carry diseases that can make a person very ill. The most common places for ticks to attach themselves are the scalp, neck, armpits, waist, and groin.  HOW CAN YOU PREVENT TICK BITES? Take these steps to help prevent tick bites when you are outdoors:  Wear long sleeves and long pants.  Wear white clothes so you can see ticks more easily.  Tuck your pant legs into your socks.  If walking on a trail, stay in the middle of the trail to avoid brushing against bushes.  Avoid walking through areas with long grass.  Put bug spray on all skin that is showing and along boot tops, pant legs, and sleeve cuffs.  Check clothes, hair, and skin often and before going inside.  Brush off any ticks that are not attached.  Take a shower or bath as soon as possible after being outdoors. HOW SHOULD YOU REMOVE A TICK? Ticks should be removed as soon as possible to help prevent diseases. 1. If latex gloves are available, put them on before trying to remove a tick. 2. Use tweezers to grasp the tick as close to the skin as possible. You may also use curved forceps or a tick removal tool. Grasp the tick as close to its head as possible. Avoid grasping the tick on its body. 3. Pull gently upward until the tick lets go. Do not twist the tick or jerk it suddenly. This may break off the tick's head or mouth parts. 4. Do not squeeze or crush the tick's body. This could force disease-carrying fluids from the tick into your body. 5. After the tick is removed, wash the bite area and your hands with soap and water or alcohol. 6. Apply a small amount of antiseptic cream or ointment to the bite site. 7. Wash any tools that were used. Do not try to remove a tick by applying a hot match, petroleum jelly, or fingernail  polish to the tick. These methods do not work. They may also increase the chances of disease being spread from the tick bite. WHEN SHOULD YOU SEEK HELP? Contact your health care provider if you are unable to remove a tick or if a part of the tick breaks off in the skin. After a tick bite, you need to watch for signs and symptoms of diseases that can be spread by ticks. Contact your health care provider if you develop any of the following:  Fever.  Rash.  Redness and puffiness (swelling) in the area of the tick bite.  Tender, puffy lymph glands.  Watery poop (diarrhea).  Weight loss.  Cough.  Feeling more tired than normal (fatigue).  Muscle, joint, or bone pain.  Belly (abdominal) pain.  Headache.  Change in your level of consciousness.  Trouble walking or moving your legs.  Loss of feeling (numbness) in the legs.  Loss of movement (paralysis).  Shortness of breath.  Confusion.  Throwing up (vomiting) many times. Document Released: 09/19/2009 Document Revised: 02/25/2013 Document Reviewed: 12/03/2012 [facility] Patient Information 2015 [place], [place]. This information is not intended to replace advice given to you by your health care provider. Make sure you discuss any questions you have with your health care provider.

## 2015-01-16 ENCOUNTER — Encounter (HOSPITAL_COMMUNITY): Payer: Self-pay | Admitting: *Deleted

## 2015-01-16 ENCOUNTER — Emergency Department (HOSPITAL_COMMUNITY): Payer: Medicare Other

## 2015-01-16 ENCOUNTER — Emergency Department (HOSPITAL_COMMUNITY)
Admission: EM | Admit: 2015-01-16 | Discharge: 2015-01-16 | Disposition: A | Discharge status: Home / Self Care | Payer: Medicare Other | Attending: Emergency Medicine | Admitting: Emergency Medicine

## 2015-01-16 DIAGNOSIS — Y9289 Other specified places as the place of occurrence of the external cause: Secondary | ICD-10-CM | POA: Diagnosis not present

## 2015-01-16 DIAGNOSIS — S59902A Unspecified injury of left elbow, initial encounter: Secondary | ICD-10-CM | POA: Insufficient documentation

## 2015-01-16 DIAGNOSIS — I2510 Atherosclerotic heart disease of native coronary artery without angina pectoris: Secondary | ICD-10-CM | POA: Insufficient documentation

## 2015-01-16 DIAGNOSIS — I10 Essential (primary) hypertension: Secondary | ICD-10-CM | POA: Diagnosis not present

## 2015-01-16 DIAGNOSIS — E119 Type 2 diabetes mellitus without complications: Secondary | ICD-10-CM | POA: Diagnosis not present

## 2015-01-16 DIAGNOSIS — Z951 Presence of aortocoronary bypass graft: Secondary | ICD-10-CM | POA: Insufficient documentation

## 2015-01-16 DIAGNOSIS — J449 Chronic obstructive pulmonary disease, unspecified: Secondary | ICD-10-CM | POA: Insufficient documentation

## 2015-01-16 DIAGNOSIS — Z720 Tobacco use: Secondary | ICD-10-CM | POA: Diagnosis not present

## 2015-01-16 DIAGNOSIS — Y998 Other external cause status: Secondary | ICD-10-CM | POA: Diagnosis not present

## 2015-01-16 DIAGNOSIS — M25522 Pain in left elbow: Secondary | ICD-10-CM

## 2015-01-16 DIAGNOSIS — I259 Chronic ischemic heart disease, unspecified: Secondary | ICD-10-CM | POA: Insufficient documentation

## 2015-01-16 DIAGNOSIS — S43402A Unspecified sprain of left shoulder joint, initial encounter: Secondary | ICD-10-CM | POA: Diagnosis not present

## 2015-01-16 DIAGNOSIS — Z79899 Other long term (current) drug therapy: Secondary | ICD-10-CM | POA: Diagnosis not present

## 2015-01-16 DIAGNOSIS — F329 Major depressive disorder, single episode, unspecified: Secondary | ICD-10-CM | POA: Diagnosis not present

## 2015-01-16 DIAGNOSIS — Y9389 Activity, other specified: Secondary | ICD-10-CM | POA: Diagnosis not present

## 2015-01-16 DIAGNOSIS — E785 Hyperlipidemia, unspecified: Secondary | ICD-10-CM | POA: Diagnosis not present

## 2015-01-16 DIAGNOSIS — I252 Old myocardial infarction: Secondary | ICD-10-CM | POA: Diagnosis not present

## 2015-01-16 DIAGNOSIS — S4992XA Unspecified injury of left shoulder and upper arm, initial encounter: Secondary | ICD-10-CM | POA: Diagnosis present

## 2015-01-16 DIAGNOSIS — W1839XA Other fall on same level, initial encounter: Secondary | ICD-10-CM | POA: Insufficient documentation

## 2015-01-16 MED ORDER — ACETAMINOPHEN 500 MG PO TABS: 500.0000 mg | ORAL_TABLET | Freq: Four times a day (QID) | ORAL | Status: DC | PRN

## 2015-01-16 MED ORDER — KETOROLAC TROMETHAMINE 60 MG/2ML IM SOLN
30.0000 mg | Freq: Once | INTRAMUSCULAR | Status: AC
  Administered 2015-01-16: 30 mg via INTRAMUSCULAR
  Filled 2015-01-16: qty 2

## 2015-01-16 MED ORDER — ACETAMINOPHEN 325 MG PO TABS
650.0000 mg | ORAL_TABLET | Freq: Once | ORAL | Status: DC
  Filled 2015-01-16: qty 2

## 2015-01-16 MED ORDER — ACETAMINOPHEN 325 MG PO TABS
650.0000 mg | ORAL_TABLET | Freq: Once | ORAL | Status: AC
  Administered 2015-01-16: 650 mg via ORAL

## 2015-01-16 NOTE — Discharge Instructions (Signed)
Arthritis, Nonspecific Arthritis is inflammation of a joint. This usually means pain, redness, warmth or swelling are present. One or more joints may be involved. There are a number of types of arthritis. Your caregiver may not be able to tell what type of arthritis you have right away. CAUSES  The most common cause of arthritis is the wear and tear on the joint (osteoarthritis). This causes damage to the cartilage, which can break down over time. The knees, hips, back and neck are most often affected by this type of arthritis. Other types of arthritis and common causes of joint pain include:  Sprains and other injuries near the joint. Sometimes minor sprains and injuries cause pain and swelling that develop hours later.  Rheumatoid arthritis. This affects hands, feet and knees. It usually affects both sides of your body at the same time. It is often associated with chronic ailments, fever, weight loss and general weakness.  Crystal arthritis. Gout and pseudo gout can cause occasional acute severe pain, redness and swelling in the foot, ankle, or knee.  Infectious arthritis. Bacteria can get into a joint through a break in overlying skin. This can cause infection of the joint. Bacteria and viruses can also spread through the blood and affect your joints.  Drug, infectious and allergy reactions. Sometimes joints can become mildly painful and slightly swollen with these types of illnesses. SYMPTOMS   Pain is the main symptom.  Your joint or joints can also be red, swollen and warm or hot to the touch.  You may have a fever with certain types of arthritis, or even feel overall ill.  The joint with arthritis will hurt with movement. Stiffness is present with some types of arthritis. DIAGNOSIS  Your caregiver will suspect arthritis based on your description of your symptoms and on your exam. Testing may be needed to find the type of arthritis:  Blood and sometimes urine tests.  X-ray tests  and sometimes CT or MRI scans.  Removal of fluid from the joint (arthrocentesis) is done to check for bacteria, crystals or other causes. Your caregiver (or a specialist) will numb the area over the joint with a local anesthetic, and use a needle to remove joint fluid for examination. This procedure is only minimally uncomfortable.  Even with these tests, your caregiver may not be able to tell what kind of arthritis you have. Consultation with a specialist (rheumatologist) may be helpful. TREATMENT  Your caregiver will discuss with you treatment specific to your type of arthritis. If the specific type cannot be determined, then the following general recommendations may apply. Treatment of severe joint pain includes:  Rest.  Elevation.  Anti-inflammatory medication (for example, ibuprofen) may be prescribed. Avoiding activities that cause increased pain.  Only take over-the-counter or prescription medicines for pain and discomfort as recommended by your caregiver.  Cold packs over an inflamed joint may be used for 10 to 15 minutes every hour. Hot packs sometimes feel better, but do not use overnight. Do not use hot packs if you are diabetic without your caregiver's permission.  A cortisone shot into arthritic joints may help reduce pain and swelling.  Any acute arthritis that gets worse over the next 1 to 2 days needs to be looked at to be sure there is no joint infection. Long-term arthritis treatment involves modifying activities and lifestyle to reduce joint stress jarring. This can include weight loss. Also, exercise is needed to nourish the joint cartilage and remove waste. This helps keep the muscles  around the joint strong. HOME CARE INSTRUCTIONS   Do not take aspirin to relieve pain if gout is suspected. This elevates uric acid levels.  Only take over-the-counter or prescription medicines for pain, discomfort or fever as directed by your caregiver.  Rest the joint as much as  possible.  If your joint is swollen, keep it elevated.  Use crutches if the painful joint is in your leg.  Drinking plenty of fluids may help for certain types of arthritis.  Follow your caregiver's dietary instructions.  Try low-impact exercise such as:  Swimming.  Water aerobics.  Biking.  Walking.  Morning stiffness is often relieved by a warm shower.  Put your joints through regular range-of-motion. SEEK MEDICAL CARE IF:   You do not feel better in 24 hours or are getting worse.  You have side effects to medications, or are not getting better with treatment. SEEK IMMEDIATE MEDICAL CARE IF:   You have a fever.  You develop severe joint pain, swelling or redness.  Many joints are involved and become painful and swollen.  There is severe back pain and/or leg weakness.  You have loss of bowel or bladder control. Document Released: 08/02/2004 Document Revised: 09/17/2011 Document Reviewed: 08/18/2008 [facility] Patient Information 2015 [place], [place]. This information is not intended to replace advice given to you by your health care provider. Make sure you discuss any questions you have with your health care provider. Shoulder Pain The shoulder is the joint that connects your arms to your body. The bones that form the shoulder joint include the upper arm bone (humerus), the shoulder blade (scapula), and the collarbone (clavicle). The top of the humerus is shaped like a ball and fits into a rather flat socket on the scapula (glenoid cavity). A combination of muscles and strong, fibrous tissues that connect muscles to bones (tendons) support your shoulder joint and hold the ball in the socket. Small, fluid-filled sacs (bursae) are located in different areas of the joint. They act as cushions between the bones and the overlying soft tissues and help reduce friction between the gliding tendons and the bone as you move your arm. Your shoulder joint allows a wide range of motion in  your arm. This range of motion allows you to do things like scratch your back or throw a ball. However, this range of motion also makes your shoulder more prone to pain from overuse and injury. Causes of shoulder pain can originate from both injury and overuse and usually can be grouped in the following four categories:  Redness, swelling, and pain (inflammation) of the tendon (tendinitis) or the bursae (bursitis).  Instability, such as a dislocation of the joint.  Inflammation of the joint (arthritis).  Broken bone (fracture). HOME CARE INSTRUCTIONS   Apply ice to the sore area.  Put ice in a plastic bag.  Place a towel between your skin and the bag.  Leave the ice on for 15-20 minutes, 3-4 times per day for the first 2 days, or as directed by your health care provider.  Stop using cold packs if they do not help with the pain.  If you have a shoulder sling or immobilizer, wear it as long as your caregiver instructs. Only remove it to shower or bathe. Move your arm as little as possible, but keep your hand moving to prevent swelling.  Squeeze a soft ball or foam pad as much as possible to help prevent swelling.  Only take over-the-counter or prescription medicines for pain, discomfort, or  fever as directed by your caregiver. SEEK MEDICAL CARE IF:   Your shoulder pain increases, or new pain develops in your arm, hand, or fingers.  Your hand or fingers become cold and numb.  Your pain is not relieved with medicines. SEEK IMMEDIATE MEDICAL CARE IF:   Your arm, hand, or fingers are numb or tingling.  Your arm, hand, or fingers are significantly swollen or turn white or blue. MAKE SURE YOU:   Understand these instructions.  Will watch your condition.  Will get help right away if you are not doing well or get worse. Document Released: 04/04/2005 Document Revised: 11/09/2013 Document Reviewed: 06/09/2011 [facility] Patient Information 2015 [place], [place]. This information is  not intended to replace advice given to you by your health care provider. Make sure you discuss any questions you have with your health care provider.  Hypertension Hypertension, commonly called high blood pressure, is when the force of blood pumping through your arteries is too strong. Your arteries are the blood vessels that carry blood from your heart throughout your body. A blood pressure reading consists of a higher number over a lower number, such as 110/72. The higher number (systolic) is the pressure inside your arteries when your heart pumps. The lower number (diastolic) is the pressure inside your arteries when your heart relaxes. Ideally you want your blood pressure below 120/80. Hypertension forces your heart to work harder to pump blood. Your arteries may become narrow or stiff. Having hypertension puts you at risk for heart disease, stroke, and other problems.  RISK FACTORS Some risk factors for high blood pressure are controllable. Others are not.  Risk factors you cannot control include:   Race. You may be at higher risk if you are African American.  Age. Risk increases with age.  Gender. Men are at higher risk than women before age 19 years. After age 45, women are at higher risk than men. Risk factors you can control include:  Not getting enough exercise or physical activity.  Being overweight.  Getting too much fat, sugar, calories, or salt in your diet.  Drinking too much alcohol. SIGNS AND SYMPTOMS Hypertension does not usually cause signs or symptoms. Extremely high blood pressure (hypertensive crisis) may cause headache, anxiety, shortness of breath, and nosebleed. DIAGNOSIS  To check if you have hypertension, your health care provider will measure your blood pressure while you are seated, with your arm held at the level of your heart. It should be measured at least twice using the same arm. Certain conditions can cause a difference in blood pressure between your  right and left arms. A blood pressure reading that is higher than normal on one occasion does not mean that you need treatment. If one blood pressure reading is high, ask your health care provider about having it checked again. TREATMENT  Treating high blood pressure includes making lifestyle changes and possibly taking medicine. Living a healthy lifestyle can help lower high blood pressure. You may need to change some of your habits. Lifestyle changes may include:  Following the DASH diet. This diet is high in fruits, vegetables, and whole grains. It is low in salt, red meat, and added sugars.  Getting at least 2 hours of brisk physical activity every week.  Losing weight if necessary.  Not smoking.  Limiting alcoholic beverages.  Learning ways to reduce stress. If lifestyle changes are not enough to get your blood pressure under control, your health care provider may prescribe medicine. You may need to take  more than one. Work closely with your health care provider to understand the risks and benefits. HOME CARE INSTRUCTIONS  Have your blood pressure rechecked as directed by your health care provider.   Take medicines only as directed by your health care provider. Follow the directions carefully. Blood pressure medicines must be taken as prescribed. The medicine does not work as well when you skip doses. Skipping doses also puts you at risk for problems.   Do not smoke.   Monitor your blood pressure at home as directed by your health care provider. SEEK MEDICAL CARE IF:   You think you are having a reaction to medicines taken.  You have recurrent headaches or feel dizzy.  You have swelling in your ankles.  You have trouble with your vision. SEEK IMMEDIATE MEDICAL CARE IF:  You develop a severe headache or confusion.  You have unusual weakness, numbness, or feel faint.  You have severe chest or abdominal pain.  You vomit repeatedly.  You have trouble  breathing. MAKE SURE YOU:   Understand these instructions.  Will watch your condition.  Will get help right away if you are not doing well or get worse. Document Released: 06/25/2005 Document Revised: 11/09/2013 Document Reviewed: 04/17/2013 [facility] Patient Information 2015 [place], [place]. This information is not intended to replace advice given to you by your health care provider. Make sure you discuss any questions you have with your health care provider.

## 2015-01-16 NOTE — ED Notes (Signed)
Declined W/C at D/C and was escorted to lobby by RN. 

## 2015-01-16 NOTE — ED Provider Notes (Signed)
CSN: [id number]     Arrival date & time 01/16/15  6712 History  This chart was scribed for Waynetta Pean, PA-C, working with Debby Freiberg, MD by Steva Colder, ED Scribe. The patient was seen in room TR10C/TR10C at 11:22 AM.     Chief Complaint  Patient presents with  . Arm Injury      The history is provided by the patient. No language interpreter was used.    HPI Comments: Wayne [name] is a 69 y.o. male with a medical hx of HTN and Type II DM who presents to the Emergency Department complaining of left arm injury onset yesterday. Pt reports that he was backing a riding lawn mower off a truck when one of the ramps fel off. He notes that the lawn mower fell on his left arm and a witness removed the lawn mower from his left arm. Pt notes that the lawn mower was on his arm for a couple seconds before it was removed. Pt states that the left arm pain goes to his collar bone. He states that he is having associated symptoms of tingling of the left forearm, left elbow pain. He denies hitting his head, numbness, weakness, LOC, left wrist pain, neck pain, back pain, and any other symptoms.   Past Medical History  Diagnosis Date  . Chronic ischemic heart disease   . Atrial fibrillation   . Peripheral vascular disease   . Hypertension   . CAD (coronary artery disease)   . Hyperlipidemia   . Depression   . Actinic keratosis   . Degeneration of cervical intervertebral disc   . Osteoarthrosis, unspecified whether generalized or localized, shoulder region   . Other disorder of muscle, ligament, and fascia   . Carpal tunnel syndrome   . COPD (chronic obstructive pulmonary disease)   . Myocardial infarction   . Shortness of breath   . Diabetes mellitus     type 2   Past Surgical History  Procedure Laterality Date  . Coronary artery bypass graft    . Coronary angioplasty    . Carpal tunnel release    . Finger amputation     Family History  Problem Relation Age of Onset  . Diabetes  Mother   . Pneumonia Father   . Gout Brother    History  Substance Use Topics  . Smoking status: Current Every Day Smoker -- 0.50 packs/day for 56 years    Types: Cigarettes  . Smokeless tobacco: Never Used  . Alcohol Use: No    Review of Systems  Constitutional: Negative for fever and chills.  Respiratory: Negative for cough, shortness of breath and wheezing.   Cardiovascular: Negative for chest pain.  Musculoskeletal: Positive for arthralgias. Negative for back pain, joint swelling, neck pain and neck stiffness.  Skin: Negative for color change, rash and wound.  Neurological: Negative for weakness and numbness.      Allergies  Codeine  Home Medications   Prior to Admission medications   Medication Sig Start Date End Date Taking? Authorizing Provider  acetaminophen (TYLENOL) 500 MG tablet Take 1 tablet (500 mg total) by mouth every 6 (six) hours as needed. 01/16/15   Waynetta Pean, PA-C  atorvastatin (LIPITOR) 80 MG tablet Take 80 mg by mouth daily.      Historical Provider, MD  carvedilol (COREG) 3.125 MG tablet Take 1 tablet (3.125 mg total) by mouth 2 (two) times daily with a meal. 08/20/14   Ripudeep Krystal Eaton, MD  cloNIDine (CATAPRES) 0.1 MG  tablet Take 0.1 mg by mouth daily.    Historical Provider, MD  cyclobenzaprine (FLEXERIL) 10 MG tablet Take 10 mg by mouth daily as needed for muscle spasms.  07/20/14   Historical Provider, MD  doxycycline (VIBRAMYCIN) 100 MG capsule Take 1 capsule (100 mg total) by mouth 2 (two) times daily. 11/18/14   Gregor Hams, MD  DULoxetine (CYMBALTA) 30 MG capsule Take 60 mg by mouth daily with breakfast.     Historical Provider, MD  gabapentin (NEURONTIN) 300 MG capsule Take 300 mg by mouth 2 (two) times daily. 09/11/14   Historical Provider, MD  glimepiride (AMARYL) 2 MG tablet Take 2 mg by mouth daily.     Historical Provider, MD  latanoprost (XALATAN) 0.005 % ophthalmic solution Place 1 drop into both eyes at bedtime. 07/06/14   Historical  Provider, MD  lisinopril (PRINIVIL,ZESTRIL) 5 MG tablet Take 5 mg by mouth daily.    Historical Provider, MD  memantine (NAMENDA XR) 28 MG CP24 24 hr capsule Take 1 capsule (28 mg total) by mouth daily. 10/28/14   Dennie Bible, NP  methocarbamol (ROBAXIN) 500 MG tablet Take 500 mg by mouth as needed. 09/08/14   Historical Provider, MD  rivaroxaban (XARELTO) 20 MG TABS tablet Take 20 mg by mouth daily.     Historical Provider, MD  traMADol (ULTRAM) 50 MG tablet Take 50 mg by mouth daily as needed. 09/16/14   Historical Provider, MD   BP 149/94 mmHg  Pulse 88  Temp(Src) 97.3 F (36.3 C) (Oral)  Resp 20  Ht 5\' 8"  (1.727 m)  Wt 183 lb (83.008 kg)  BMI 27.83 kg/m2  SpO2 99% Physical Exam  Constitutional: He appears well-developed and well-nourished. No distress.  Nontoxic appearing.  HENT:  Head: Normocephalic and atraumatic.  Right Ear: External ear normal.  Left Ear: External ear normal.  Mouth/Throat: Oropharynx is clear and moist.  Eyes: Conjunctivae are normal. Pupils are equal, round, and reactive to light. Right eye exhibits no discharge. Left eye exhibits no discharge.  Neck: Normal range of motion. Neck supple. No JVD present. No tracheal deviation present.  Cardiovascular: Normal rate, regular rhythm, normal heart sounds and intact distal pulses.   HR is 88. Bilateral radial pulse intact.  Pulmonary/Chest: Effort normal and breath sounds normal. No respiratory distress. He has no wheezes. He has no rales.  Abdominal: Soft. There is no tenderness.  Musculoskeletal:       Left shoulder: He exhibits tenderness and swelling.       Left elbow: He exhibits no swelling and no deformity. Tenderness found.       Left wrist: Normal.       Left forearm: Normal. He exhibits no tenderness.  Pain with Abduction of left shoulder. Tenderness over the left trapezuis muscle. . No left forearm tenderness. Mild left lateral elbow tenderness without deformity, edema, or ecchymosis. No left  humeral tenderness. No left shoulder bony point tenderness. Patient has good grip strength of his bilateral hands.  Lymphadenopathy:    He has no cervical adenopathy.  Neurological: He is alert. No sensory deficit. Coordination normal.  Sensation intact of left upper extremity. The patient's left median, radial and ulnar nerves are intact   Skin: Skin is warm and dry. No rash noted. He is not diaphoretic. No erythema. No pallor.  Psychiatric: He has a normal mood and affect. His behavior is normal.  Nursing note and vitals reviewed.   ED Course  Procedures (including critical care time) DIAGNOSTIC STUDIES:  Oxygen Saturation is 100% on RA, nl by my interpretation.    COORDINATION OF CARE: 11:25 AM-Discussed treatment plan which includes left shoulder x-ray and left elbow x-ray with pt at bedside and pt agreed to plan.   Labs Review Labs Reviewed - No data to display  Imaging Review Dg Elbow Complete Left  01/16/2015   CLINICAL DATA:  Slipped and fell today.  Injured left elbow.  EXAM: LEFT ELBOW - COMPLETE 3+ VIEW  COMPARISON:  None.  FINDINGS: The joint spaces are maintained. Mild degenerative changes but no acute fracture or osteochondral abnormality. No joint effusion.  IMPRESSION: No acute bony findings or joint effusion.   Electronically Signed   By: Marijo Sanes M.D.   On: 01/16/2015 11:29   Dg Shoulder Left  01/16/2015   CLINICAL DATA:  Patient status post fall while working on lawn lower. Initial encounter.  EXAM: LEFT SHOULDER - 2+ VIEW  COMPARISON:  Left shoulder radiograph 08/16/2014  FINDINGS: No evidence for acute fracture or dislocation. Stable appearance of left shoulder joint with glenohumeral osteoarthritis. Re- demonstrated chronic fragmentation about the [facility] joint.  IMPRESSION: No acute osseous abnormality.  Advanced acromioclavicular and glenohumeral osteoarthritis.   Electronically Signed   By: Lovey Newcomer M.D.   On: 01/16/2015 11:29     EKG Interpretation None       Filed Vitals:   01/16/15 0959 01/16/15 1019 01/16/15 1200  BP: 155/91  149/94  Pulse: 104  88  Temp: 97.3 F (36.3 C)    TempSrc: Oral    Resp: 22  20  Height:  5\' 8"  (1.727 m)   Weight:  183 lb (83.008 kg)   SpO2: 100%  99%     MDM   Meds given in ED:  Medications  acetaminophen (TYLENOL) tablet 650 mg (650 mg Oral Given 01/16/15 1157)  ketorolac (TORADOL) injection 30 mg (30 mg Intramuscular Given 01/16/15 1159)    Discharge Medication List as of 01/16/2015 12:02 PM    START taking these medications   Details  acetaminophen (TYLENOL) 500 MG tablet Take 1 tablet (500 mg total) by mouth every 6 (six) hours as needed., Starting 01/16/2015, Until Discontinued, Print        Final diagnoses:  Shoulder sprain, left, initial encounter  Left elbow pain   This is a 69 year old male who presents to the emergency department complaining of left shoulder and left elbow pain after lawnmower fell on his arm yesterday. The patient denies head injury or loss of consciousness. On exam the patient has no left shoulder bony point tenderness. There is no deformity noted. Patient does have tenderness over his left trapezius muscle in his left lateral elbow without deformity. Left shoulder x-ray shows no acute osseous abnormality however does show advance osteoarthritis. Left elbow x-ray is unremarkable. Patient is requesting pain shot and as his creatinine was found to be recently within normal limits we'll provide him with half dose of toradol here today. Patient discharged with prescription for Tylenol for his pain. Advised the patient to have his blood pressure rechecked by PCP this week as it is elevated today. I advised the patient to follow-up with their primary care provider this week. I advised the patient to return to the emergency department with new or worsening symptoms or new concerns. The patient verbalized understanding and agreement with plan.    I personally performed the services  described in this documentation, which was scribed in my presence. The recorded information has been reviewed and is  accurate.       Waynetta Pean, PA-C 01/16/15 1655  Debby Freiberg, MD 01/17/15 1004

## 2015-01-16 NOTE — ED Notes (Signed)
Pt reports he was backing a Engineer, manufacturing systems off a truck on [place]. And one of the ramps fell off . The ridding lawn mower fell on to PT LT arm. Pt unable to remove ridding lawn mower until a passer by stopped to roll ridding mower off of him. Pt unable to raise arm above head with out assistance. Pt reports pain from collar bone down Lt arm.

## 2015-02-02 ENCOUNTER — Ambulatory Visit: Payer: Medicare Other | Admitting: Internal Medicine

## 2015-03-24 ENCOUNTER — Ambulatory Visit: Payer: Medicare Other | Admitting: Nurse Practitioner

## 2015-03-24 ENCOUNTER — Telehealth: Payer: Self-pay | Admitting: *Deleted

## 2015-03-24 ENCOUNTER — Other Ambulatory Visit: Payer: Self-pay

## 2015-03-24 MED ORDER — MEMANTINE HCL ER 28 MG PO CP24: 28.0000 mg | ORAL_CAPSULE | Freq: Every day | ORAL | Status: DC

## 2015-03-24 NOTE — Telephone Encounter (Signed)
LMVM to return call to reschedule missed appt.

## 2015-03-28 ENCOUNTER — Encounter: Payer: Self-pay | Admitting: Nurse Practitioner

## 2015-03-31 ENCOUNTER — Ambulatory Visit (INDEPENDENT_AMBULATORY_CARE_PROVIDER_SITE_OTHER): Payer: Medicare HMO | Admitting: Internal Medicine

## 2015-03-31 ENCOUNTER — Encounter: Payer: Self-pay | Admitting: Internal Medicine

## 2015-03-31 VITALS — BP 126/80 | HR 74 | Ht 68.0 in | Wt 183.2 lb

## 2015-03-31 DIAGNOSIS — F17200 Nicotine dependence, unspecified, uncomplicated: Secondary | ICD-10-CM

## 2015-03-31 DIAGNOSIS — J449 Chronic obstructive pulmonary disease, unspecified: Secondary | ICD-10-CM

## 2015-03-31 DIAGNOSIS — R911 Solitary pulmonary nodule: Secondary | ICD-10-CM | POA: Diagnosis not present

## 2015-03-31 DIAGNOSIS — Z720 Tobacco use: Secondary | ICD-10-CM

## 2015-03-31 DIAGNOSIS — Z23 Encounter for immunization: Secondary | ICD-10-CM | POA: Diagnosis not present

## 2015-03-31 NOTE — Patient Instructions (Addendum)
#  Right lung nodule  - stable 91mm Nodule Oct 2014 through Nov 2015 but grown to 5mm mid-April 2016  - repeat CT chest wo contrast super-d protocol in mid-oct 2016  #Moderate COPD  - stable disease  -try sample inhaler spiriva 1 puff dialy - flu shot 03/31/2015   #SMoking  - advise to quit but respect your desire to continue smoking  #Followup  Oct 2016 ater CT chest to review nodule and response to spiriva

## 2015-03-31 NOTE — Progress Notes (Signed)
Subjective:    Patient ID: Wayne [name], male    DOB: Aug 20, 1945, 69 y.o.   MRN: [id number]  HPI   OV 03/31/2015  Chief Complaint  Patient presents with  . Follow-up    Pt states his breathing is unchanged since las OV with TP in 10/2014. Pt denies cough and CP/tightness.     69 year old stoic person . Here for fu of   Lung nodule - 53mm RLL diagnosed Oct 2014  - 03/15/14  INDI Blood test  - lung nodule probability for cancer is INDETERMINATE (done 03/15/14)  -  CT Super D 05/25/14 -  29mm RLL nodule unchnaged from Juluy 2015 and [place] 2014 -   - CT 10/23/14 - increased to 3mm  Smoking:  - still does. REfused to quit.  COPD moderate  - mild to moderate symptoms +. Refuses inhalers but after persuations agrees to try. No hx of ER , urgen care visits or exacerbation. Agrees for flu shot 03/31/2015       Immunization History  Administered Date(s) Administered  . Influenza Split 04/22/2014  . Influenza Whole 05/31/2009, 07/11/2010  . Pneumococcal Conjugate-13 11/01/2014  . Pneumococcal Polysaccharide-23 05/09/2005  . Pneumococcal-Unspecified 11/13/2011  . Td 05/31/2009  . Zoster 01/06/2013       Current outpatient prescriptions:  .  acetaminophen (TYLENOL) 500 MG tablet, Take 1 tablet (500 mg total) by mouth every 6 (six) hours as needed., Disp: 30 tablet, Rfl: 0 .  atorvastatin (LIPITOR) 80 MG tablet, Take 80 mg by mouth daily.  , Disp: , Rfl:  .  carvedilol (COREG) 3.125 MG tablet, Take 1 tablet (3.125 mg total) by mouth 2 (two) times daily with a meal., Disp: 60 tablet, Rfl: 3 .  cloNIDine (CATAPRES) 0.1 MG tablet, Take 0.1 mg by mouth daily., Disp: , Rfl:  .  cyclobenzaprine (FLEXERIL) 10 MG tablet, Take 10 mg by mouth daily as needed for muscle spasms. , Disp: , Rfl: 5 .  DULoxetine (CYMBALTA) 30 MG capsule, Take 60 mg by mouth daily with breakfast. , Disp: , Rfl:  .  gabapentin (NEURONTIN) 300 MG capsule, Take 300 mg by mouth 2 (two) times daily., Disp: , Rfl: 5 .   glimepiride (AMARYL) 2 MG tablet, Take 2 mg by mouth daily. , Disp: , Rfl:  .  latanoprost (XALATAN) 0.005 % ophthalmic solution, Place 1 drop into both eyes at bedtime., Disp: , Rfl: 5 .  lisinopril (PRINIVIL,ZESTRIL) 5 MG tablet, Take 5 mg by mouth daily., Disp: , Rfl:  .  methocarbamol (ROBAXIN) 500 MG tablet, Take 500 mg by mouth as needed., Disp: , Rfl: 2 .  rivaroxaban (XARELTO) 20 MG TABS tablet, Take 20 mg by mouth daily. , Disp: , Rfl:  .  traMADol (ULTRAM) 50 MG tablet, Take 50 mg by mouth daily as needed., Disp: , Rfl: 2 .  memantine (NAMENDA XR) 28 MG CP24 24 hr capsule, Take 1 capsule (28 mg total) by mouth daily. (Patient not taking: Reported on 03/31/2015), Disp: 15 capsule, Rfl: 0    Review of Systems  Constitutional: Negative for fever and unexpected weight change.  HENT: Negative for congestion, dental problem, ear pain, nosebleeds, postnasal drip, rhinorrhea, sinus pressure, sneezing, sore throat and trouble swallowing.   Eyes: Negative for redness and itching.  Respiratory: Positive for shortness of breath. Negative for cough, chest tightness and wheezing.   Cardiovascular: Negative for palpitations and leg swelling.  Gastrointestinal: Negative for nausea and vomiting.  Genitourinary: Negative for  dysuria.  Musculoskeletal: Negative for joint swelling.  Skin: Negative for rash.  Neurological: Negative for headaches.  Hematological: Does not bruise/bleed easily.  Psychiatric/Behavioral: Negative for dysphoric mood. The patient is not nervous/anxious.        Objective:   Physical Exam  Constitutional: He is oriented to person, place, and time. He appears well-developed and well-nourished. No distress.  HENT:  Head: Normocephalic and atraumatic.  Right Ear: External ear normal.  Left Ear: External ear normal.  Mouth/Throat: Oropharynx is clear and moist. No oropharyngeal exudate.  Eyes: Conjunctivae and EOM are normal. Pupils are equal, round, and reactive to light.  Right eye exhibits no discharge. Left eye exhibits no discharge. No scleral icterus.  Neck: Normal range of motion. Neck supple. No JVD present. No tracheal deviation present. No thyromegaly present.  Cardiovascular: Normal rate, regular rhythm and intact distal pulses.  Exam reveals no gallop and no friction rub.   No murmur heard. Pulmonary/Chest: Effort normal and breath sounds normal. No respiratory distress. He has no wheezes. He has no rales. He exhibits no tenderness.  Abdominal: Soft. Bowel sounds are normal. He exhibits no distension and no mass. There is no tenderness. There is no rebound and no guarding.  Musculoskeletal: Normal range of motion. He exhibits no edema or tenderness.  Lymphadenopathy:    He has no cervical adenopathy.  Neurological: He is alert and oriented to person, place, and time. He has normal reflexes. No cranial nerve deficit. Coordination normal.  Skin: Skin is warm and dry. No rash noted. He is not diaphoretic. No erythema. No pallor.  Psychiatric: He has a normal mood and affect. His behavior is normal. Judgment and thought content normal.  Nursing note and vitals reviewed.   Filed Vitals:   03/31/15 1356  BP: 126/80  Pulse: 74  Height: 5\' 8"  (1.727 m)  Weight: 183 lb 3.2 oz (83.099 kg)  SpO2: 98%         Assessment & Plan:     ICD-9-CM ICD-10-CM   1. COPD, moderate 496 J44.9   2. Nodule of right lung 793.11 R91.1   3. Smoking 305.1 Z72.0    #Right lung nodule  - stable 66mm Nodule Oct 2014 through Nov 2015 but grown to 32mm mid-April 2016  - repeat CT chest wo contrast super-d protocol in mid-oct 2016  #Moderate COPD  - stable disease  -try sample inhaler spiriva 1 puff dialy - flu shot 03/31/2015   #SMoking  - advise to quit but respect your desire to continue smoking  #Followup  Oct 2016 ater CT chest to review nodule and response to spiriva     Dr. Brand Males, M.D., [facility].C.P Pulmonary and Critical Care Medicine Staff  Physician [place] [facility] Pager: [phone], If no answer or between  15:00h - 7:00h: call [phone]  03/31/2015 2:18 PM    #Right lung nodule  - stable 44mm Nodule Oct 2014 through Nov 2015 but grown to 61mm mid-April 2016  - repeat CT chest wo contrast super-d protocol in mid-oct 2016  #Moderate COPD  - stable disease  -try sample inhaler spiriva 1 puff dialy - flu shot 03/31/2015   #SMoking  - advise to quit but respect your desire to continue smoking  #Followup  Oct 2016 ater CT chest to review nodule and response to spiriva

## 2015-04-02 ENCOUNTER — Other Ambulatory Visit: Payer: Self-pay | Admitting: Nurse Practitioner

## 2015-04-06 ENCOUNTER — Encounter: Payer: Self-pay | Admitting: Nurse Practitioner

## 2015-04-06 ENCOUNTER — Ambulatory Visit (INDEPENDENT_AMBULATORY_CARE_PROVIDER_SITE_OTHER): Payer: Medicare HMO | Admitting: Nurse Practitioner

## 2015-04-06 VITALS — BP 124/94 | HR 68 | Ht 68.5 in | Wt 183.8 lb

## 2015-04-06 DIAGNOSIS — F0390 Unspecified dementia without behavioral disturbance: Secondary | ICD-10-CM | POA: Diagnosis not present

## 2015-04-06 DIAGNOSIS — I6330 Cerebral infarction due to thrombosis of unspecified cerebral artery: Secondary | ICD-10-CM | POA: Diagnosis not present

## 2015-04-06 DIAGNOSIS — I10 Essential (primary) hypertension: Secondary | ICD-10-CM
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
## 2015-04-06 NOTE — Patient Instructions (Signed)
Continue Xarelto for secondary stroke prevention and atrial fibrillation Strict control of hypertension with goal below 130/90 todays reading 124/94 Diabetes with hemoglobin A1c below 6.5 last was 6.7 LDL cholesterol below 100 last reading 108 followed by PCP  Namenda 28mg  daily for memory loss will refill Follow-up in 6 months, next visit with Dr.Pennumalli

## 2015-04-07 NOTE — Progress Notes (Signed)
I reviewed note and agree with plan.   Penni Bombard, MD 8/33/7445, 14:60 AM Certified in Neurology, Neurophysiology and Neuroimaging  [facility] [address], [place], [place] [address] [phone]

## 2015-04-19 ENCOUNTER — Ambulatory Visit (INDEPENDENT_AMBULATORY_CARE_PROVIDER_SITE_OTHER)
Admission: RE | Admit: 2015-04-19 | Discharge: 2015-04-19 | Disposition: A | Discharge status: Home / Self Care | Payer: Medicare HMO | Source: Ambulatory Visit | Attending: Internal Medicine | Admitting: Internal Medicine

## 2015-04-19 DIAGNOSIS — J449 Chronic obstructive pulmonary disease, unspecified: Secondary | ICD-10-CM

## 2015-04-19 DIAGNOSIS — R911 Solitary pulmonary nodule: Secondary | ICD-10-CM | POA: Diagnosis not present

## 2015-04-19 DIAGNOSIS — Z720 Tobacco use: Secondary | ICD-10-CM | POA: Diagnosis not present

## 2015-04-19 DIAGNOSIS — F17200 Nicotine dependence, unspecified, uncomplicated: Secondary | ICD-10-CM

## 2015-04-22 ENCOUNTER — Ambulatory Visit: Payer: Medicare HMO | Admitting: Internal Medicine

## 2015-04-25 ENCOUNTER — Telehealth: Payer: Self-pay | Admitting: Internal Medicine

## 2015-04-25 NOTE — Telephone Encounter (Signed)
Will discuss findings with Sandi Mariscal at follow-up in November 2016   MPRESSION: 1. No significant change in smoothly marginated benign appearing 8 x 9 mm right lower lobe pulmonary nodule compared to recent prior examinations. 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 2. Mild diffuse bronchial wall thickening with mild centrilobular emphysema; imaging findings suggestive of underlying COPD. 3. Atherosclerosis, including left main and 3 vessel coronary artery disease. Status post median sternotomy for CABG, including LIMA to the LAD. 4. Mild cardiomegaly. 5. Cholelithiasis.   Electronically Signed  By: Vinnie Langton M.D.  On: 04/19/2015 17:38

## 2015-04-25 NOTE — Telephone Encounter (Signed)
Will await appt for MR to discuss findings per documentation. Will sign off.

## 2015-05-20 ENCOUNTER — Encounter (HOSPITAL_COMMUNITY): Payer: Self-pay | Admitting: *Deleted

## 2015-05-20 ENCOUNTER — Emergency Department (HOSPITAL_COMMUNITY): Payer: Medicare HMO

## 2015-05-20 ENCOUNTER — Emergency Department (HOSPITAL_COMMUNITY)
Admission: EM | Admit: 2015-05-20 | Discharge: 2015-05-20 | Disposition: A | Discharge status: Home / Self Care | Payer: Medicare HMO | Attending: Emergency Medicine | Admitting: Emergency Medicine

## 2015-05-20 DIAGNOSIS — J449 Chronic obstructive pulmonary disease, unspecified: Secondary | ICD-10-CM | POA: Insufficient documentation

## 2015-05-20 DIAGNOSIS — I4891 Unspecified atrial fibrillation: Secondary | ICD-10-CM | POA: Diagnosis not present

## 2015-05-20 DIAGNOSIS — W19XXXA Unspecified fall, initial encounter: Secondary | ICD-10-CM

## 2015-05-20 DIAGNOSIS — E119 Type 2 diabetes mellitus without complications: Secondary | ICD-10-CM | POA: Insufficient documentation

## 2015-05-20 DIAGNOSIS — I2510 Atherosclerotic heart disease of native coronary artery without angina pectoris: Secondary | ICD-10-CM | POA: Insufficient documentation

## 2015-05-20 DIAGNOSIS — Z9861 Coronary angioplasty status: Secondary | ICD-10-CM | POA: Diagnosis not present

## 2015-05-20 DIAGNOSIS — M25511 Pain in right shoulder: Secondary | ICD-10-CM | POA: Diagnosis present

## 2015-05-20 DIAGNOSIS — I10 Essential (primary) hypertension: Secondary | ICD-10-CM | POA: Diagnosis not present

## 2015-05-20 DIAGNOSIS — E785 Hyperlipidemia, unspecified: Secondary | ICD-10-CM | POA: Diagnosis not present

## 2015-05-20 DIAGNOSIS — Z720 Tobacco use: Secondary | ICD-10-CM | POA: Diagnosis not present

## 2015-05-20 DIAGNOSIS — M19011 Primary osteoarthritis, right shoulder: Secondary | ICD-10-CM | POA: Insufficient documentation

## 2015-05-20 DIAGNOSIS — Z79899 Other long term (current) drug therapy: Secondary | ICD-10-CM | POA: Insufficient documentation

## 2015-05-20 DIAGNOSIS — I252 Old myocardial infarction: Secondary | ICD-10-CM | POA: Diagnosis not present

## 2015-05-20 DIAGNOSIS — Z8669 Personal history of other diseases of the nervous system and sense organs: Secondary | ICD-10-CM | POA: Insufficient documentation

## 2015-05-20 DIAGNOSIS — Z872 Personal history of diseases of the skin and subcutaneous tissue: Secondary | ICD-10-CM | POA: Diagnosis not present

## 2015-05-20 DIAGNOSIS — Y92009 Unspecified place in unspecified non-institutional (private) residence as the place of occurrence of the external cause: Secondary | ICD-10-CM

## 2015-05-20 DIAGNOSIS — Z951 Presence of aortocoronary bypass graft: Secondary | ICD-10-CM | POA: Insufficient documentation

## 2015-05-20 DIAGNOSIS — F329 Major depressive disorder, single episode, unspecified: Secondary | ICD-10-CM | POA: Insufficient documentation

## 2015-05-20 LAB — CBC WITH DIFFERENTIAL/PLATELET
Basophils Absolute: 0.1 10*3/uL (ref 0.0–0.1)
Basophils Relative: 1 %
EOS PCT: 1 %
Eosinophils Absolute: 0.1 10*3/uL (ref 0.0–0.7)
HCT: 43.2 % (ref 39.0–52.0)
Hemoglobin: 14.8 g/dL (ref 13.0–17.0)
LYMPHS ABS: 2.2 10*3/uL (ref 0.7–4.0)
LYMPHS PCT: 28 %
MCH: 32 pg (ref 26.0–34.0)
MCHC: 34.3 g/dL (ref 30.0–36.0)
MCV: 93.3 fL (ref 78.0–100.0)
MONO ABS: 0.6 10*3/uL (ref 0.1–1.0)
MONOS PCT: 7 %
Neutro Abs: 4.8 10*3/uL (ref 1.7–7.7)
Neutrophils Relative %: 63 %
PLATELETS: 160 10*3/uL (ref 150–400)
RBC: 4.63 MIL/uL (ref 4.22–5.81)
RDW: 12.6 % (ref 11.5–15.5)
WBC: 7.6 10*3/uL (ref 4.0–10.5)

## 2015-05-20 LAB — COMPREHENSIVE METABOLIC PANEL
ALT: 17 U/L (ref 17–63)
AST: 23 U/L (ref 15–41)
Albumin: 3.7 g/dL (ref 3.5–5.0)
Alkaline Phosphatase: 119 U/L (ref 38–126)
Anion gap: 9 (ref 5–15)
BILIRUBIN TOTAL: 0.7 mg/dL (ref 0.3–1.2)
BUN: 9 mg/dL (ref 6–20)
CHLORIDE: 103 mmol/L (ref 101–111)
CO2: 24 mmol/L (ref 22–32)
Calcium: 9.3 mg/dL (ref 8.9–10.3)
Creatinine, Ser: 0.7 mg/dL (ref 0.61–1.24)
Glucose, Bld: 175 mg/dL — ABNORMAL HIGH (ref 65–99)
POTASSIUM: 4.1 mmol/L (ref 3.5–5.1)
Sodium: 136 mmol/L (ref 135–145)
TOTAL PROTEIN: 6.9 g/dL (ref 6.5–8.1)

## 2015-05-20 LAB — TROPONIN I

## 2015-05-20 MED ORDER — HYDROCODONE-ACETAMINOPHEN 5-325 MG PO TABS: 2.0000 | ORAL_TABLET | ORAL | Status: DC | PRN

## 2015-05-20 MED ORDER — KETOROLAC TROMETHAMINE 30 MG/ML IJ SOLN
30.0000 mg | Freq: Once | INTRAMUSCULAR | Status: AC
  Administered 2015-05-20: 30 mg via INTRAMUSCULAR
  Filled 2015-05-20: qty 1

## 2015-05-20 NOTE — Discharge Instructions (Signed)
Take your medications as prescribed as needed for pain relief. Follow-up with orthopedics next week. Return to the emergency department if symptoms worsen or new onset of headache, visual changes, numbness, tingling, weakness, swelling, syncope, seizure, chest pain, shortness of breath, vomiting.

## 2015-05-20 NOTE — ED Notes (Signed)
Pt. Left with all belongings. Discharge instructions were reviewed and all questions were answered.  

## 2015-05-20 NOTE — ED Provider Notes (Signed)
CSN: [id number]     Arrival date & time 05/20/15  1330 History   First MD Initiated Contact with Patient 05/20/15 1335     Chief Complaint  Patient presents with  . Shoulder Pain     (Consider location/radiation/quality/duration/timing/severity/associated sxs/prior Treatment) HPI Comments: Patient is a 69 year old male with past medical history of A. fib, HTN, CAD, MI, COPD, DM, CVA (08/19/14) who presents to the ED with complaint of right arm and shoulder pain, onset 2 days. Patient reports when he woke up 2 days ago he noticed having constant aching right arm, shoulder, collarbone pain that is aggravated with movement. Patient denies any recent trauma, injury, fall. Denies numbness, tingling. He reports that he has difficulty moving his right arm due to pain with movement. Denies fever, chills, SOB, CP, palpitations, headache, visual changes, lightheaded, dizziness, abdominal pain, N/V/D. he reports that he has had similar shoulder pain in the past but notes that this is significantly worse. He states he was given an injection that helped with his shoulder pain. Patient reports when he had a stroke it caused him to fall off the bed and land on his right side. He endorses having residual weakness to his right arm and left s/p CVA. Patient is on Xarelto.    Past Medical History  Diagnosis Date  . Chronic ischemic heart disease   . Atrial fibrillation ([place])   . Peripheral vascular disease ([place])   . Hypertension   . CAD (coronary artery disease)   . Hyperlipidemia   . Depression   . Actinic keratosis   . Degeneration of cervical intervertebral disc   . Osteoarthrosis, unspecified whether generalized or localized, shoulder region   . Other disorder of muscle, ligament, and fascia   . Carpal tunnel syndrome   . COPD (chronic obstructive pulmonary disease) ([place])   . Myocardial infarction ([place])   . Shortness of breath   . Diabetes mellitus     type 2   Past Surgical History  Procedure  Laterality Date  . Coronary artery bypass graft    . Coronary angioplasty    . Carpal tunnel release    . Finger amputation     Family History  Problem Relation Age of Onset  . Diabetes Mother   . Pneumonia Father   . Gout Brother    Social History  Substance Use Topics  . Smoking status: Current Every Day Smoker -- 0.50 packs/day for 56 years    Types: Cigarettes  . Smokeless tobacco: Never Used  . Alcohol Use: No    Review of Systems  Musculoskeletal: Positive for arthralgias.  Neurological: Positive for weakness.      Allergies  Codeine  Home Medications   Prior to Admission medications   Medication Sig Start Date End Date Taking? Authorizing Provider  acetaminophen (TYLENOL) 500 MG tablet Take 1 tablet (500 mg total) by mouth every 6 (six) hours as needed. Patient taking differently: Take 500 mg by mouth every 6 (six) hours as needed for mild pain.  01/16/15  Yes Waynetta Pean, PA-C  amLODipine (NORVASC) 5 MG tablet TK 1 T PO  ONCE A DAY 01/22/15  Yes Historical Provider, MD  atorvastatin (LIPITOR) 80 MG tablet Take 80 mg by mouth daily.     Yes Historical Provider, MD  carvedilol (COREG) 3.125 MG tablet Take 1 tablet (3.125 mg total) by mouth 2 (two) times daily with a meal. 08/20/14  Yes Ripudeep K Rai, MD  cloNIDine (CATAPRES) 0.1 MG tablet Take 0.1 mg  by mouth daily.   Yes Historical Provider, MD  DULoxetine (CYMBALTA) 30 MG capsule Take 60 mg by mouth daily with breakfast.    Yes Historical Provider, MD  gabapentin (NEURONTIN) 300 MG capsule Take 300 mg by mouth 2 (two) times daily. 09/11/14  Yes Historical Provider, MD  glimepiride (AMARYL) 2 MG tablet Take 2 mg by mouth daily.    Yes Historical Provider, MD  lisinopril (PRINIVIL,ZESTRIL) 5 MG tablet Take 5 mg by mouth daily.   Yes Historical Provider, MD  NAMENDA XR 28 MG CP24 24 hr capsule TAKE ONE CAPSULE BY MOUTH EVERY DAY. PLEASE RESCHEDULE APPOINTMENT WITH DOCTOR 04/06/15  Yes Dennie Bible, NP   rivaroxaban (XARELTO) 20 MG TABS tablet Take 20 mg by mouth daily.    Yes Historical Provider, MD  SPIRIVA HANDIHALER 18 MCG inhalation capsule INHALE CONTENTS OF 1 CAPSULE ONCE DAILY 02/14/15  Yes Historical Provider, MD  cyclobenzaprine (FLEXERIL) 10 MG tablet Take 10 mg by mouth daily as needed for muscle spasms.  07/20/14   Historical Provider, MD  methocarbamol (ROBAXIN) 500 MG tablet Take 500 mg by mouth every 8 (eight) hours as needed for muscle spasms.  09/08/14   Historical Provider, MD  traMADol (ULTRAM) 50 MG tablet Take 50 mg by mouth daily as needed for moderate pain.  09/16/14   Historical Provider, MD   BP 157/89 mmHg  Pulse 77  Resp 19  Ht 5' 8.5" (1.74 m)  Wt 183 lb (83.008 kg)  BMI 27.42 kg/m2  SpO2 95% Physical Exam  Constitutional: He is oriented to person, place, and time. He appears well-developed and well-nourished. No distress.  HENT:  Head: Normocephalic and atraumatic.  Mouth/Throat: Oropharynx is clear and moist. No oropharyngeal exudate.  Eyes: Conjunctivae and EOM are normal. Right eye exhibits no discharge. Left eye exhibits no discharge. No scleral icterus.  Right pupil irregularly shaped (unchanged per pt s/p eye injury 20 years ago), left pupil equal, round and reactive to light.   Neck: Normal range of motion. Neck supple.  Cardiovascular: Normal rate, normal heart sounds and intact distal pulses.   afib  Pulmonary/Chest: Effort normal and breath sounds normal. No respiratory distress. He has no wheezes. He has no rales. He exhibits no tenderness.  Abdominal: Soft. Bowel sounds are normal. He exhibits no distension and no mass. There is no tenderness. There is no rebound and no guarding.  Musculoskeletal: He exhibits tenderness. He exhibits no edema.       Right shoulder: He exhibits decreased range of motion (due to pain), tenderness (anterior, lateral and posterior shoulder) and decreased strength (due to pain). He exhibits no swelling, no effusion, no  crepitus, no deformity, no laceration, no spasm and normal pulse.  Decreased passive and active range of motion of right shoulder with flexion and abduction due to pain. 2+ radial pulses. Sensation intact. Patient is only able to lift his arm off the bed a few inches due to reported pain. Cap refill less than 2.  5/5 strength is BLE, 2+ PT pulses. No swelling. Sensation intact.  No C/T/L midline tenderness. FROM of back, no tenderness.   Lymphadenopathy:    He has no cervical adenopathy.  Neurological: He is alert and oriented to person, place, and time. No cranial nerve deficit or sensory deficit. Coordination normal.  Skin: Skin is warm and dry. He is not diaphoretic.  Nursing note and vitals reviewed.   ED Course  Procedures (including critical care time) Labs Review Labs Reviewed  CBC  WITH DIFFERENTIAL/PLATELET  TROPONIN I  COMPREHENSIVE METABOLIC PANEL    Imaging Review No results found. I have personally reviewed and evaluated these images and lab results as part of my medical decision-making.   EKG Interpretation   Date/Time:  Friday May 20 2015 13:34:23 EST Ventricular Rate:  84 PR Interval:    QRS Duration: 96 QT Interval:  386 QTC Calculation: 456 R Axis:   98 Text Interpretation:  Atrial fibrillation Right axis deviation No  significant change since last tracing Confirmed by YAO  MD, DAVID ([id number])  on 05/20/2015 1:36:51 PM     Filed Vitals:   05/20/15 1511  BP: 148/89  Pulse: 77  Resp: 18     MDM   Final diagnoses:  Right shoulder pain  Osteoarthritis of right shoulder, unspecified osteoarthritis type    Patient presents with right arm pain. Denies any recent fall, trauma, injury. History of osteoarthritis. Patient is on Xarelto for afib, he also has hx of CVA (08/19/14). VSS. Exam reveals right shoulder tenderness, no swelling or signs of injury. Decreased passive and active range of motion of right shoulder due to pain on exam, otherwise right  arm neurovascularly intact. No neuro deficits, right pupil irregularly shaped (unchanged per pt s/p eye injury that occurred 20 years ago).  5/5 strength in bilateral legs. Patient able to ambulate, no ataxia. Patient reports he does not have any pain when laying still in bed, denies pain meds at this time.   Labs unremarkable. Troponin negative. EKG shows A. fib, unchanged. Chest x-ray shows no acute process. Right shoulder x-ray shows severe osteoarthritis of right glenohumeral joint, no acute abnormalities. CT head ordered due to patient's history and being a poor historian. CT head shows no acute mass, hemorrhage or infarct. I suspect patient's pain is likely due to to severe osteoarthritis. Discussed results with patient and plan for discharge. Patient given orthopedic follow-up.   Evaluation does not show pathology requring ongoing emergent intervention or admission. Pt is hemodynamically stable and mentating appropriately. Discussed findings/results and plan with patient/guardian, who agrees with plan. All questions answered. Return precautions discussed and outpatient follow up given.        Chesley Noon [place], [place] 05/20/15 [place] Yao, MD 05/21/15 [phone]

## 2015-05-20 NOTE — ED Notes (Addendum)
Pt from home via [facility] for bil shoulder pain, R greater than L, that radiates to collar bones.  Pain increases with movement.  Pt states he came here recently for same symptoms and was given a shot that "took the pain away until recently".

## 2015-05-30 ENCOUNTER — Ambulatory Visit (INDEPENDENT_AMBULATORY_CARE_PROVIDER_SITE_OTHER): Payer: Medicare HMO | Admitting: Internal Medicine

## 2015-05-30 ENCOUNTER — Encounter: Payer: Self-pay | Admitting: Internal Medicine

## 2015-05-30 VITALS — BP 110/74 | HR 74 | Ht 68.0 in | Wt 183.0 lb

## 2015-05-30 DIAGNOSIS — J449 Chronic obstructive pulmonary disease, unspecified: Secondary | ICD-10-CM

## 2015-05-30 DIAGNOSIS — R911 Solitary pulmonary nodule: Secondary | ICD-10-CM

## 2015-05-30 DIAGNOSIS — F17200 Nicotine dependence, unspecified, uncomplicated: Secondary | ICD-10-CM

## 2015-05-30 DIAGNOSIS — Z720 Tobacco use: Secondary | ICD-10-CM | POA: Diagnosis not present

## 2015-05-30 NOTE — Progress Notes (Signed)
Subjective:     Patient ID: Wayne [name], male   DOB: [birth year], 69 y.o.   MRN: [id number]  HPI  OV 05/30/2015 FUJ several issues  Lung nodule - 13mm RLL diagnosed Oct 2014  - 03/15/14  INDI Blood test  - lung nodule probability for cancer is INDETERMINATE (done 03/15/14)  -  CT Super D 05/25/14 -  56mm RLL nodule unchnaged from Juluy 2015 and Parks 2014 -   - CT 10/23/14 - increased to 76mm  - CT chest 04/19/15 - no change, smooth poer DR Entrikin - favors HAMARTOMA  Smoking:  - still does. reports that he has been smoking Cigarettes.  He has a 28 pack-year smoking history. He has never used smokeless tobacco.  he does not want to quit  COPD moderate  - mild mild symptoms only as best as I can gather. He is an extremely difficult historian. I had asked questions several different ways. I am not sure that he is taking his Spiriva inhaler. It appears that he might of had a problem with Spiriva Respimat. He says that he will go back to taking his inhalers but I cannot trust him. He appears to be up-to-date with his flu shot. Off note he is in a nonspecific beta blocker and ACE inhibitor in the setting of COPD but he does not have the cognitive capacity to understand that these medications could infract with his COPD. His cardiologist he says his doctor [place] nevertheless he does not seem to be suffering from a ill effects  with these medications on board     1. No significant change in smoothly marginated benign appearing 8 x 9 mm right lower lobe pulmonary nodule compared to recent prior examinations. 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 2. Mild diffuse bronchial wall thickening with mild centrilobular emphysema; imaging findings suggestive of underlying COPD. 3. Atherosclerosis, including left main and 3 vessel coronary artery disease. Status post median sternotomy for CABG, including LIMA to the LAD. 4. Mild cardiomegaly. 5. Cholelithiasis.   Electronically Signed  By:  Vinnie Langton M.D.  On: 04/19/2015 17:38   Social history: Reviewed neurology notes from September 2016 and noticed that he was educated only up to ninth grade. He is a little red and not able to read or write. There is also reports of short-term memory deficits the last 2 years. He lives with his wife and granddaughter who given the medications. Nevertheless he was able to drive to the office pulmonary today. He did have a small left acute pontine infarct in February 2016. He is on Xarelto for secondary stroke prevention and atrial fibrillation. He has intracranial atherosclerosis  Immunization History  Administered Date(s) Administered  . Influenza Split 04/22/2014  . Influenza Whole 05/31/2009, 07/11/2010  . Influenza,inj,Quad PF,36+ Mos 03/31/2015  . Pneumococcal Conjugate-13 11/01/2014  . Pneumococcal Polysaccharide-23 05/09/2005  . Pneumococcal-Unspecified 11/13/2011  . Td 05/31/2009  . Zoster 01/06/2013    Allergies  Allergen Reactions  . Codeine Itching     Current outpatient prescriptions:  .  amLODipine (NORVASC) 5 MG tablet, TK 1 T PO  ONCE A DAY, Disp: , Rfl: 5 .  atorvastatin (LIPITOR) 80 MG tablet, Take 80 mg by mouth daily.  , Disp: , Rfl:  .  carvedilol (COREG) 3.125 MG tablet, Take 1 tablet (3.125 mg total) by mouth 2 (two) times daily with a meal., Disp: 60 tablet, Rfl: 3 .  cloNIDine (CATAPRES) 0.1 MG tablet, Take 0.1 mg by mouth  daily., Disp: , Rfl:  .  cyclobenzaprine (FLEXERIL) 10 MG tablet, Take 10 mg by mouth daily as needed for muscle spasms. , Disp: , Rfl: 5 .  DULoxetine (CYMBALTA) 30 MG capsule, Take 60 mg by mouth daily with breakfast. , Disp: , Rfl:  .  glimepiride (AMARYL) 2 MG tablet, Take 2 mg by mouth daily. , Disp: , Rfl:  .  HYDROcodone-acetaminophen (NORCO/VICODIN) 5-325 MG tablet, Take 2 tablets by mouth every 4 (four) hours as needed., Disp: 10 tablet, Rfl: 0 .  lisinopril (PRINIVIL,ZESTRIL) 5 MG tablet, Take 5 mg by mouth daily., Disp: ,  Rfl:  .  NAMENDA XR 28 MG CP24 24 hr capsule, TAKE ONE CAPSULE BY MOUTH EVERY DAY. PLEASE RESCHEDULE APPOINTMENT WITH DOCTOR, Disp: 30 capsule, Rfl: 5 .  rivaroxaban (XARELTO) 20 MG TABS tablet, Take 20 mg by mouth daily. , Disp: , Rfl:  .  gabapentin (NEURONTIN) 300 MG capsule, Take 300 mg by mouth 2 (two) times daily., Disp: , Rfl: 5 .  methocarbamol (ROBAXIN) 500 MG tablet, Take 500 mg by mouth every 8 (eight) hours as needed for muscle spasms. , Disp: , Rfl: 2 .  SPIRIVA HANDIHALER 18 MCG inhalation capsule, INHALE CONTENTS OF 1 CAPSULE ONCE DAILY, Disp: , Rfl: 5   Review of Systems According to history of present illness    Objective:   Physical Exam  Constitutional: He is oriented to person, place, and time. He appears well-developed and well-nourished. No distress.  HENT:  Head: Normocephalic and atraumatic.  Right Ear: External ear normal.  Left Ear: External ear normal.  Mouth/Throat: Oropharynx is clear and moist. No oropharyngeal exudate.  Eyes: Conjunctivae and EOM are normal. Pupils are equal, round, and reactive to light. Right eye exhibits no discharge. Left eye exhibits no discharge. No scleral icterus.  Neck: Normal range of motion. Neck supple. No JVD present. No tracheal deviation present. No thyromegaly present.  Cardiovascular: Normal rate, regular rhythm and intact distal pulses.  Exam reveals no gallop and no friction rub.   No murmur heard. Pulmonary/Chest: Effort normal and breath sounds normal. No respiratory distress. He has no wheezes. He has no rales. He exhibits no tenderness.  Abdominal: Soft. Bowel sounds are normal. He exhibits no distension and no mass. There is no tenderness. There is no rebound and no guarding.  Musculoskeletal: Normal range of motion. He exhibits no edema or tenderness.  Lymphadenopathy:    He has no cervical adenopathy.  Neurological: He is alert and oriented to person, place, and time. He has normal reflexes. No cranial nerve  deficit. Coordination normal.  Skin: Skin is warm and dry. No rash noted. He is not diaphoretic. No erythema. No pallor.  Psychiatric:  Extremely poor historian Stoic  Nursing note and vitals reviewed.  Filed Vitals:   05/30/15 1548  BP: 110/74  Pulse: 74  Height: 5\' 8"  (1.727 m)  Weight: 183 lb (83.008 kg)  SpO2: 98%       Assessment:       ICD-9-CM ICD-10-CM   1. COPD, moderate ([place]) 496 J44.9   2. Nodule of right lung 793.11 R91.1   3. Smoking 305.1 Z72.0        Plan:      #COPD - Please resume your Spiriva as soon as possible - sending messagte to Dr Einar Gip to change to beta-1 specific beta blocker and ARB instead of ACE inhibitor   #Nodule of right lung - This is stable in the last 1 year. Less  likely it is lung cancer but still not ruled out - Do repeat CT chest without contrast in 6 months super D protocol  #Smoking - Please quit smoking as soon as possible  #Follow-up - 6 months or sooner as needed      Dr. Brand Males, M.D., [facility].C.P Pulmonary and Critical Care Medicine Staff Physician [facility] Pager: [phone], If no answer or between  15:00h - 7:00h: call [phone]  05/30/2015 4:20 PM

## 2015-05-30 NOTE — Patient Instructions (Signed)
ICD-9-CM ICD-10-CM   1. COPD, moderate ([place]) 496 J44.9   2. Nodule of right lung 793.11 R91.1   3. Smoking 305.1 Z72.0     #COPD - Please resume your Spiriva as soon as possible  #Nodule of right lung - This is stable in the last 1 year. Less likely it is lung cancer but still not ruled out - Do repeat CT chest without contrast in 6 months super D protocol  #Smoking - Please quit smoking as soon as possible  #Follow-up - 6 months or sooner as needed

## 2015-10-05 ENCOUNTER — Ambulatory Visit: Payer: Medicare HMO | Admitting: Diagnostic Neuroimaging

## 2015-10-07 ENCOUNTER — Encounter: Payer: Self-pay | Admitting: Diagnostic Neuroimaging

## 2015-11-21 ENCOUNTER — Ambulatory Visit (INDEPENDENT_AMBULATORY_CARE_PROVIDER_SITE_OTHER)
Admission: RE | Admit: 2015-11-21 | Discharge: 2015-11-21 | Disposition: A | Discharge status: Home / Self Care | Payer: Medicare Other | Source: Ambulatory Visit | Attending: Internal Medicine | Admitting: Internal Medicine

## 2015-11-21 DIAGNOSIS — R911 Solitary pulmonary nodule: Secondary | ICD-10-CM | POA: Diagnosis not present

## 2015-11-23 ENCOUNTER — Telehealth: Payer: Self-pay | Admitting: Internal Medicine

## 2015-11-23 DIAGNOSIS — R911 Solitary pulmonary nodule: Secondary | ICD-10-CM

## 2015-11-23 NOTE — Telephone Encounter (Signed)
   #  Right lung nodule - stable 66mm Nodule Oct 2014 through Nov 2015 but grown to 8x69mm mid-April 2016 -> no change Nov 2016 and again May 2017. Radiology in Nov 2016 called this benign  PLAN  - reepat CT chest super D protcol in Dec 2017    Ct Super D Chest Wo Contrast  11/21/2015  CLINICAL DATA:  Right lung nodule. EXAM: CT CHEST WITHOUT CONTRAST TECHNIQUE: Multidetector CT imaging of the chest was performed using thin slice collimation for electromagnetic bronchoscopy planning purposes, without intravenous contrast. COMPARISON:  04/19/2015 FINDINGS: Mediastinum/Lymph Nodes: There is no axillary lymphadenopathy. No mediastinal lymphadenopathy. No evidence for gross hilar lymphadenopathy although assessment is limited by the lack of intravenous contrast on today's study. The heart size is normal. No pericardial effusion. Patient is status post CABG. The esophagus has normal imaging features. Lungs/Pleura: Centrilobular emphysema noted in both upper lobes. 2 mm right apical nodule unchanged (image 8 series 3). 9 mm right lower lobe pulmonary nodule is stable (image 29 series 3). No focal airspace consolidation. No pulmonary edema or pleural effusion. Upper abdomen: 5 mm calcified stone identified in the gallbladder. No adrenal nodule or mass. Musculoskeletal: Bone windows reveal no worrisome lytic or sclerotic osseous lesions. IMPRESSION: 1. Stable 9 mm right lower lobe pulmonary nodule. 2. Emphysema. 3. Atherosclerosis. 4. Cholelithiasis. Electronically Signed   By: Misty Stanley M.D.   On: 11/21/2015 12:19

## 2015-11-23 NOTE — Telephone Encounter (Signed)
Pt is aware of results. Order has been placed for Super D CT in December.

## 2016-04-22 ENCOUNTER — Encounter (HOSPITAL_COMMUNITY): Payer: Self-pay | Admitting: Emergency Medicine

## 2016-04-22 ENCOUNTER — Emergency Department (HOSPITAL_COMMUNITY): Payer: Medicare Other

## 2016-04-22 ENCOUNTER — Emergency Department (HOSPITAL_COMMUNITY)
Admission: EM | Admit: 2016-04-22 | Discharge: 2016-04-22 | Disposition: A | Discharge status: Home / Self Care | Payer: Medicare Other | Attending: Emergency Medicine | Admitting: Emergency Medicine

## 2016-04-22 DIAGNOSIS — Z7984 Long term (current) use of oral hypoglycemic drugs: Secondary | ICD-10-CM | POA: Diagnosis not present

## 2016-04-22 DIAGNOSIS — I2510 Atherosclerotic heart disease of native coronary artery without angina pectoris: Secondary | ICD-10-CM | POA: Insufficient documentation

## 2016-04-22 DIAGNOSIS — Y939 Activity, unspecified: Secondary | ICD-10-CM | POA: Diagnosis not present

## 2016-04-22 DIAGNOSIS — Y929 Unspecified place or not applicable: Secondary | ICD-10-CM | POA: Diagnosis not present

## 2016-04-22 DIAGNOSIS — G8929 Other chronic pain: Secondary | ICD-10-CM

## 2016-04-22 DIAGNOSIS — I252 Old myocardial infarction: Secondary | ICD-10-CM | POA: Insufficient documentation

## 2016-04-22 DIAGNOSIS — W010XXA Fall on same level from slipping, tripping and stumbling without subsequent striking against object, initial encounter: Secondary | ICD-10-CM | POA: Diagnosis not present

## 2016-04-22 DIAGNOSIS — Z85828 Personal history of other malignant neoplasm of skin: Secondary | ICD-10-CM | POA: Insufficient documentation

## 2016-04-22 DIAGNOSIS — W19XXXA Unspecified fall, initial encounter: Secondary | ICD-10-CM

## 2016-04-22 DIAGNOSIS — Z79899 Other long term (current) drug therapy: Secondary | ICD-10-CM | POA: Insufficient documentation

## 2016-04-22 DIAGNOSIS — Z7901 Long term (current) use of anticoagulants: Secondary | ICD-10-CM | POA: Diagnosis not present

## 2016-04-22 DIAGNOSIS — Y999 Unspecified external cause status: Secondary | ICD-10-CM | POA: Diagnosis not present

## 2016-04-22 DIAGNOSIS — F17210 Nicotine dependence, cigarettes, uncomplicated: Secondary | ICD-10-CM | POA: Insufficient documentation

## 2016-04-22 DIAGNOSIS — M25511 Pain in right shoulder: Secondary | ICD-10-CM | POA: Insufficient documentation

## 2016-04-22 DIAGNOSIS — E119 Type 2 diabetes mellitus without complications: Secondary | ICD-10-CM | POA: Insufficient documentation

## 2016-04-22 DIAGNOSIS — J449 Chronic obstructive pulmonary disease, unspecified: Secondary | ICD-10-CM | POA: Diagnosis not present

## 2016-04-22 DIAGNOSIS — R410 Disorientation, unspecified: Secondary | ICD-10-CM | POA: Insufficient documentation

## 2016-04-22 DIAGNOSIS — Z951 Presence of aortocoronary bypass graft: Secondary | ICD-10-CM | POA: Insufficient documentation

## 2016-04-22 DIAGNOSIS — M25512 Pain in left shoulder: Secondary | ICD-10-CM | POA: Insufficient documentation

## 2016-04-22 DIAGNOSIS — I10 Essential (primary) hypertension: Secondary | ICD-10-CM | POA: Insufficient documentation

## 2016-04-22 MED ORDER — ACETAMINOPHEN 500 MG PO TABS: 500.0000 mg | ORAL_TABLET | Freq: Four times a day (QID) | ORAL | 0 refills | Status: AC | PRN

## 2016-04-22 MED ORDER — FENTANYL CITRATE (PF) 100 MCG/2ML IJ SOLN
25.0000 ug | Freq: Once | INTRAMUSCULAR | Status: AC
  Administered 2016-04-22: 25 ug via INTRAMUSCULAR
  Filled 2016-04-22: qty 2

## 2016-04-22 NOTE — ED Notes (Signed)
Per xray tech, states that patient is very confused and reports to her that he hit his head

## 2016-04-22 NOTE — ED Notes (Signed)
Patient transported to CT 

## 2016-04-22 NOTE — Discharge Instructions (Signed)
Your images today showed signs of arthritis in your shoulders. The imaging of your head was reassuring, no acute problems. Follow up with your primary care provider for re-evaluation and pain management. Return to the ED if you experience severe worsening of your symptoms, blurry vision, vomiting, loss of control of your bowel and bladder.

## 2016-04-22 NOTE — ED Notes (Signed)
Patient ambulated down hall with steady gait and denies light headedness or any pain.

## 2016-04-22 NOTE — ED Provider Notes (Signed)
[facility] DEPT Provider Note   CSN: [id number] Arrival date & time: 04/22/16  1404     History   Chief Complaint Chief Complaint  Patient presents with  . Fall  . rib cage pain  . Back Pain  . Shoulder Pain    HPI Wayne [name] is a 70 y.o. male with a pmhx of CAD s/p CABG, a.fib on Xarelto, HTN, HLD, DM, dementia who presents to the ED today to be evaluated after a fall that occurred 3 days ago. Level V caveat, dementia. Pt states that he tripped, fell forward landing on his left side. Pt does report striking the left side of his head, no LOC. He is complaining of ongoing pain in his left ribs, bilateral shoulders. Pt and daughter who is a bedside state that pt has been having ongoing shoulder pain x 1 year after a fall that occurred 1 year ago. Pt has not been taking anything for pain. He denies any dizziness, chest pain, blurry vision, HA, vomiting. Pt is able to ambulate at baseline.  HPI  Past Medical History:  Diagnosis Date  . Actinic keratosis   . Atrial fibrillation ([place])   . CAD (coronary artery disease)   . Carpal tunnel syndrome   . Chronic ischemic heart disease   . COPD (chronic obstructive pulmonary disease) ([place])   . Degeneration of cervical intervertebral disc   . Depression   . Diabetes mellitus    type 2  . Hyperlipidemia   . Hypertension   . Myocardial infarction   . Osteoarthrosis, unspecified whether generalized or localized, shoulder region   . Other disorder of muscle, ligament, and fascia   . Peripheral vascular disease ([place])   . Shortness of breath     Patient Active Problem List   Diagnosis Date Noted  . Cerebral thrombosis with cerebral infarction ([place]) 08/20/2014  . Numbness on right side 08/19/2014  . Dementia 08/19/2014  . Right sided weakness 08/19/2014  . COPD, moderate ([place]) 06/07/2014  . Smoking 06/07/2014  . Nodule of right lung 03/03/2014  . Chest pain 05/15/2012  . Shoulder pain 05/15/2012  . RECTAL BLEEDING, HX OF  10/03/2010  . SEBORRHEIC KERATOSIS 07/11/2010  . FACIAL PAIN 04/06/2010  . ACTINIC KERATOSIS, HEAD 05/12/2009  . NEOPLASM OF UNCERTAIN BEHAVIOR OF SKIN 02/04/2009  . DEPRESSION 02/04/2009  . OSTEOARTHRITIS, SHOULDER, LEFT 09/17/2008  . DEGENERATIVE DISC DISEASE, CERVICAL SPINE 09/17/2008  . DIABETES MELLITUS, TYPE II, UNCONTROLLED 11/14/2007  . Essential hypertension 11/14/2007  . CARPAL TUNNEL SYNDROME, RIGHT 08/07/2007  . COPD 08/07/2007  . CARPAL TUNNEL RELEASE, LEFT, HX OF 08/07/2007  . BRONCHITIS 06/13/2007  . DYSLIPIDEMIA 03/24/2007  . NICOTINE ADDICTION 03/24/2007  . Coronary atherosclerosis 03/24/2007  . DISEASE, ISCHEMIC HEART, CHRONIC NEC 03/24/2007  . FIBRILLATION, ATRIAL 03/24/2007  . Peripheral vascular disease, unspecified 03/24/2007  . PERCUTANEOUS TRANSLUMINAL CORONARY ANGIOPLASTY, HX OF 10/04/2003    Past Surgical History:  Procedure Laterality Date  . CARPAL TUNNEL RELEASE    . CORONARY ANGIOPLASTY    . CORONARY ARTERY BYPASS GRAFT    . FINGER AMPUTATION         Home Medications    Prior to Admission medications   Medication Sig Start Date End Date Taking? Authorizing Provider  lisinopril (PRINIVIL,ZESTRIL) 5 MG tablet Take 5 mg by mouth daily.   Yes Historical Provider, MD  rivaroxaban (XARELTO) 20 MG TABS tablet Take 20 mg by mouth daily with supper.    Yes Historical Provider, MD  cloNIDine (CATAPRES)  0.1 MG tablet Take 0.1 mg by mouth daily.    Historical Provider, MD  cyclobenzaprine (FLEXERIL) 10 MG tablet Take 10 mg by mouth at bedtime.  07/20/14   Historical Provider, MD  pioglitazone (ACTOS) 15 MG tablet Take 15 mg by mouth daily.    Historical Provider, MD  SPIRIVA HANDIHALER 18 MCG inhalation capsule INHALE CONTENTS OF 1 CAPSULE ONCE DAILY 02/14/15   Historical Provider, MD    Family History Family History  Problem Relation Age of Onset  . Diabetes Mother   . Pneumonia Father   . Gout Brother     Social History Social History    Substance Use Topics  . Smoking status: Current Every Day Smoker    Packs/day: 0.50    Years: 56.00    Types: Cigarettes  . Smokeless tobacco: Never Used  . Alcohol use No     Allergies   Codeine   Review of Systems Review of Systems  All other systems reviewed and are negative.    Physical Exam Updated Vital Signs BP 130/91 (BP Location: Left Arm)   Pulse 89   Temp 97.6 F (36.4 C) (Oral)   Resp 19   Ht 5\' 8"  (1.727 m)   SpO2 100%   Physical Exam  Constitutional: He is oriented to person, place, and time. He appears well-developed and well-nourished. No distress.  HENT:  Head: Normocephalic and atraumatic.  Mouth/Throat: No oropharyngeal exudate.  No battle's sign. No racoon eyes. No hemotympanum.   Eyes: Conjunctivae and EOM are normal. Pupils are equal, round, and reactive to light. Right eye exhibits no discharge. Left eye exhibits no discharge. No scleral icterus.  Neck: Normal range of motion. Neck supple.  Cardiovascular: Normal rate, regular rhythm, normal heart sounds and intact distal pulses.  Exam reveals no gallop and no friction rub.   No murmur heard. Pulmonary/Chest: Effort normal and breath sounds normal. No respiratory distress. He has no wheezes. He has no rales. He exhibits no tenderness.  Mild TTP of 6th and 7th ribs in the left MCL. No ecchymosis. Chest expansion equal b/l with inspiration.   Abdominal: Soft. He exhibits no distension. There is no tenderness. There is no guarding.  Musculoskeletal: Normal range of motion. He exhibits no edema.  No midline spinal TTP. FROM of C, T, L spine. No step offs or obvious bony deformities. Negative SLR.    Neurological: He is alert and oriented to person, place, and time.  Strength 5/5 throughout. No sensory deficits. No gait abnormality. No dysmetria. No slurred speech. No facial droop. Negative pronator drift.    Skin: Skin is warm and dry. No rash noted. He is not diaphoretic. No erythema. No pallor.   Psychiatric: He has a normal mood and affect. His behavior is normal.  Nursing note and vitals reviewed.    ED Treatments / Results  Labs (all labs ordered are listed, but only abnormal results are displayed) Labs Reviewed - No data to display  EKG  EKG Interpretation None       Radiology Dg Ribs Unilateral W/chest Left  Result Date: 04/22/2016 CLINICAL DATA:  Fall on Thursday with left rib pain. Initial encounter. EXAM: LEFT RIBS AND CHEST - 3+ VIEW COMPARISON:  Chest CT 11/21/2015 FINDINGS: There are old fractures of the left ribs but no acute fracture is seen. No hemothorax or pneumothorax. Chronic cardiomegaly. The patient is status post CABG. Advanced left glenohumeral osteoarthritis. IMPRESSION: No acute finding in the chest or left ribs. Electronically Signed  By: Monte Fantasia M.D.   On: 04/22/2016 15:00    Procedures Procedures (including critical care time)  Medications Ordered in ED Medications  fentaNYL (SUBLIMAZE) injection 25 mcg (not administered)     Initial Impression / Assessment and Plan / ED Course  I have reviewed the triage vital signs and the nursing notes.  Pertinent labs & imaging results that were available during my care of the patient were reviewed by me and considered in my medical decision making (see chart for details).  Clinical Course    70 y.o M with a pmhx of dementia, CAD, HTN Dm presents to the ED today BIB family member to be evaluated after a mechanical fall that occurred 3 days ago. Pt c/o left rib pain that is acute, also c.o b/l shoulder pain that has . On Xarelto. CT head, cervical spine and CXR with ribs unremarkable for acute injury. Pt is ambulatory in the ED without difficulty. Pain managed in ED. NO neuro deficits noted on exam. Pt appears to be mildly confused, this is baseline per family d/t dementia. Pt may follow up with PCP for further evaluation. Discussed with family concerning home health care due to increasing in  frequency of falls, however pt states he is not currently interested in this option. Return precautions outlined in patient discharge instructions.   Patient was discussed with and seen by Dr. Ralene Bathe who agrees with the treatment plan.    Final Clinical Impressions(s) / ED Diagnoses   Final diagnoses:  Fall  Chronic pain of both shoulders    New Prescriptions New Prescriptions   No medications on file     Carlos Levering, PA-C 04/25/16 1825    Quintella Reichert, MD 05/04/16 [phone]

## 2016-04-22 NOTE — ED Triage Notes (Addendum)
Patient fell on Thursday and c/o left rib cage pain, back, right shoulder pain. patient denies hitting head or LOC.  Patient takes Xarelto

## 2016-06-14 ENCOUNTER — Inpatient Hospital Stay: Admission: RE | Admit: 2016-06-14 | Payer: Medicare Other | Source: Ambulatory Visit

## 2016-06-24 ENCOUNTER — Emergency Department (HOSPITAL_COMMUNITY): Payer: Medicare Other

## 2016-06-24 ENCOUNTER — Encounter (HOSPITAL_COMMUNITY): Payer: Self-pay

## 2016-06-24 ENCOUNTER — Inpatient Hospital Stay (HOSPITAL_COMMUNITY)
Admission: EM | Admit: 2016-06-24 | Discharge: 2016-07-09 | DRG: 871 | Disposition: E | Payer: Medicare Other | Attending: Pulmonary Disease | Admitting: Pulmonary Disease

## 2016-06-24 DIAGNOSIS — Z515 Encounter for palliative care: Secondary | ICD-10-CM | POA: Diagnosis present

## 2016-06-24 DIAGNOSIS — E872 Acidosis: Secondary | ICD-10-CM | POA: Diagnosis present

## 2016-06-24 DIAGNOSIS — E1151 Type 2 diabetes mellitus with diabetic peripheral angiopathy without gangrene: Secondary | ICD-10-CM | POA: Diagnosis present

## 2016-06-24 DIAGNOSIS — I739 Peripheral vascular disease, unspecified: Secondary | ICD-10-CM | POA: Diagnosis present

## 2016-06-24 DIAGNOSIS — E785 Hyperlipidemia, unspecified: Secondary | ICD-10-CM | POA: Diagnosis present

## 2016-06-24 DIAGNOSIS — D751 Secondary polycythemia: Secondary | ICD-10-CM | POA: Diagnosis present

## 2016-06-24 DIAGNOSIS — Z7984 Long term (current) use of oral hypoglycemic drugs: Secondary | ICD-10-CM

## 2016-06-24 DIAGNOSIS — I82401 Acute embolism and thrombosis of unspecified deep veins of right lower extremity: Secondary | ICD-10-CM | POA: Diagnosis present

## 2016-06-24 DIAGNOSIS — J449 Chronic obstructive pulmonary disease, unspecified: Secondary | ICD-10-CM | POA: Diagnosis present

## 2016-06-24 DIAGNOSIS — R4182 Altered mental status, unspecified: Secondary | ICD-10-CM | POA: Diagnosis present

## 2016-06-24 DIAGNOSIS — Z66 Do not resuscitate: Secondary | ICD-10-CM | POA: Diagnosis not present

## 2016-06-24 DIAGNOSIS — D72819 Decreased white blood cell count, unspecified: Secondary | ICD-10-CM | POA: Diagnosis present

## 2016-06-24 DIAGNOSIS — J9601 Acute respiratory failure with hypoxia: Secondary | ICD-10-CM | POA: Diagnosis present

## 2016-06-24 DIAGNOSIS — K7689 Other specified diseases of liver: Secondary | ICD-10-CM | POA: Diagnosis present

## 2016-06-24 DIAGNOSIS — Z79899 Other long term (current) drug therapy: Secondary | ICD-10-CM

## 2016-06-24 DIAGNOSIS — R6521 Severe sepsis with septic shock: Secondary | ICD-10-CM | POA: Diagnosis present

## 2016-06-24 DIAGNOSIS — Z833 Family history of diabetes mellitus: Secondary | ICD-10-CM

## 2016-06-24 DIAGNOSIS — Z95828 Presence of other vascular implants and grafts: Secondary | ICD-10-CM

## 2016-06-24 DIAGNOSIS — G92 Toxic encephalopathy: Secondary | ICD-10-CM | POA: Diagnosis present

## 2016-06-24 DIAGNOSIS — E1165 Type 2 diabetes mellitus with hyperglycemia: Secondary | ICD-10-CM | POA: Diagnosis present

## 2016-06-24 DIAGNOSIS — I469 Cardiac arrest, cause unspecified: Secondary | ICD-10-CM | POA: Diagnosis present

## 2016-06-24 DIAGNOSIS — A419 Sepsis, unspecified organism: Secondary | ICD-10-CM | POA: Diagnosis present

## 2016-06-24 DIAGNOSIS — M7989 Other specified soft tissue disorders: Secondary | ICD-10-CM | POA: Diagnosis not present

## 2016-06-24 DIAGNOSIS — Z789 Other specified health status: Secondary | ICD-10-CM | POA: Diagnosis not present

## 2016-06-24 DIAGNOSIS — K7200 Acute and subacute hepatic failure without coma: Secondary | ICD-10-CM | POA: Diagnosis present

## 2016-06-24 DIAGNOSIS — I4891 Unspecified atrial fibrillation: Secondary | ICD-10-CM

## 2016-06-24 DIAGNOSIS — E1169 Type 2 diabetes mellitus with other specified complication: Secondary | ICD-10-CM | POA: Diagnosis present

## 2016-06-24 DIAGNOSIS — F17210 Nicotine dependence, cigarettes, uncomplicated: Secondary | ICD-10-CM | POA: Diagnosis present

## 2016-06-24 DIAGNOSIS — D696 Thrombocytopenia, unspecified: Secondary | ICD-10-CM | POA: Diagnosis present

## 2016-06-24 DIAGNOSIS — I481 Persistent atrial fibrillation: Secondary | ICD-10-CM | POA: Diagnosis present

## 2016-06-24 DIAGNOSIS — B950 Streptococcus, group A, as the cause of diseases classified elsewhere: Secondary | ICD-10-CM | POA: Diagnosis present

## 2016-06-24 DIAGNOSIS — I2510 Atherosclerotic heart disease of native coronary artery without angina pectoris: Secondary | ICD-10-CM | POA: Diagnosis present

## 2016-06-24 DIAGNOSIS — Z9861 Coronary angioplasty status: Secondary | ICD-10-CM

## 2016-06-24 DIAGNOSIS — I252 Old myocardial infarction: Secondary | ICD-10-CM

## 2016-06-24 DIAGNOSIS — Z951 Presence of aortocoronary bypass graft: Secondary | ICD-10-CM

## 2016-06-24 DIAGNOSIS — Z7901 Long term (current) use of anticoagulants: Secondary | ICD-10-CM

## 2016-06-24 DIAGNOSIS — N179 Acute kidney failure, unspecified: Secondary | ICD-10-CM | POA: Diagnosis present

## 2016-06-24 DIAGNOSIS — Z885 Allergy status to narcotic agent status: Secondary | ICD-10-CM

## 2016-06-24 DIAGNOSIS — I10 Essential (primary) hypertension: Secondary | ICD-10-CM | POA: Diagnosis present

## 2016-06-24 LAB — URINALYSIS, ROUTINE W REFLEX MICROSCOPIC
Glucose, UA: 50 mg/dL — AB
Ketones, ur: 5 mg/dL — AB
Leukocytes, UA: NEGATIVE
Nitrite: NEGATIVE
PROTEIN: 100 mg/dL — AB
SPECIFIC GRAVITY, URINE: 1.028 (ref 1.005–1.030)
pH: 5 (ref 5.0–8.0)

## 2016-06-24 LAB — COMPREHENSIVE METABOLIC PANEL
ALBUMIN: 3.2 g/dL — AB (ref 3.5–5.0)
ALT: UNDETERMINED U/L (ref 17–63)
ANION GAP: 21 — AB (ref 5–15)
AST: UNDETERMINED U/L (ref 15–41)
Alkaline Phosphatase: 132 U/L — ABNORMAL HIGH (ref 38–126)
BILIRUBIN TOTAL: UNDETERMINED mg/dL (ref 0.3–1.2)
BUN: 51 mg/dL — ABNORMAL HIGH (ref 6–20)
CO2: 15 mmol/L — AB (ref 22–32)
Calcium: 10.1 mg/dL (ref 8.9–10.3)
Chloride: 101 mmol/L (ref 101–111)
Creatinine, Ser: 1.72 mg/dL — ABNORMAL HIGH (ref 0.61–1.24)
Glucose, Bld: 270 mg/dL — ABNORMAL HIGH (ref 65–99)
Potassium: 4.4 mmol/L (ref 3.5–5.1)
Sodium: 137 mmol/L (ref 135–145)
Total Protein: 7.5 g/dL (ref 6.5–8.1)

## 2016-06-24 LAB — TSH: TSH: 1.661 u[IU]/mL (ref 0.350–4.500)

## 2016-06-24 LAB — ETHANOL: Alcohol, Ethyl (B): <5 mg/dL (ref <5)

## 2016-06-24 LAB — CBC WITH DIFFERENTIAL/PLATELET
BASOS PCT: 0 %
Basophils Absolute: 0 10*3/uL (ref 0.0–0.1)
EOS ABS: 0 10*3/uL (ref 0.0–0.7)
Eosinophils Relative: 0 %
HCT: 53.7 % — ABNORMAL HIGH (ref 39.0–52.0)
Hemoglobin: 18.5 g/dL — ABNORMAL HIGH (ref 13.0–17.0)
LYMPHS PCT: 4 %
Lymphs Abs: 0.2 10*3/uL — ABNORMAL LOW (ref 0.7–4.0)
MCH: 30.8 pg (ref 26.0–34.0)
MCHC: 34.5 g/dL (ref 30.0–36.0)
MCV: 89.5 fL (ref 78.0–100.0)
MONO ABS: 0.1 10*3/uL (ref 0.1–1.0)
Monocytes Relative: 2 %
NEUTROS PCT: 94 %
Neutro Abs: 3.6 10*3/uL (ref 1.7–7.7)
PLATELETS: 76 10*3/uL — AB (ref 150–400)
RBC: 6 MIL/uL — AB (ref 4.22–5.81)
RDW: 15.3 % (ref 11.5–15.5)
WBC: 3.9 10*3/uL — AB (ref 4.0–10.5)

## 2016-06-24 LAB — I-STAT ARTERIAL BLOOD GAS, ED
ACID-BASE DEFICIT: 4 mmol/L — AB (ref 0.0–2.0)
ACID-BASE DEFICIT: 5 mmol/L — AB (ref 0.0–2.0)
BICARBONATE: 17.5 mmol/L — AB (ref 20.0–28.0)
Bicarbonate: 16.6 mmol/L — ABNORMAL LOW (ref 20.0–28.0)
O2 Saturation: 85 %
O2 Saturation: 96 %
PH ART: 7.439 (ref 7.350–7.450)
PO2 ART: 54 mmHg — AB (ref 83.0–108.0)
TCO2: 17 mmol/L (ref 0–100)
TCO2: 18 mmol/L (ref 0–100)
pCO2 arterial: 25.2 mmHg — ABNORMAL LOW (ref 32.0–48.0)
pCO2 arterial: 30.2 mmHg — ABNORMAL LOW (ref 32.0–48.0)
pH, Arterial: 7.384 (ref 7.350–7.450)
pO2, Arterial: 93 mmHg (ref 83.0–108.0)

## 2016-06-24 LAB — DIC (DISSEMINATED INTRAVASCULAR COAGULATION) PANEL
APTT: 43 s — AB (ref 24–36)
INR: 1.63
PLATELETS: 48 10*3/uL — AB (ref 150–400)

## 2016-06-24 LAB — PROTIME-INR
INR: 1.53
Prothrombin Time: 18.5 seconds — ABNORMAL HIGH (ref 11.4–15.2)

## 2016-06-24 LAB — I-STAT CG4 LACTIC ACID, ED
LACTIC ACID, VENOUS: 7.25 mmol/L — AB (ref 0.5–1.9)
LACTIC ACID, VENOUS: 9.45 mmol/L — AB (ref 0.5–1.9)

## 2016-06-24 LAB — AMMONIA: AMMONIA: 35 umol/L (ref 9–35)

## 2016-06-24 LAB — CBG MONITORING, ED: GLUCOSE-CAPILLARY: 249 mg/dL — AB (ref 65–99)

## 2016-06-24 LAB — PHOSPHORUS: PHOSPHORUS: 1.5 mg/dL — AB (ref 2.5–4.6)

## 2016-06-24 LAB — DIC (DISSEMINATED INTRAVASCULAR COAGULATION)PANEL
Fibrinogen: 675 mg/dL — ABNORMAL HIGH (ref 210–475)
Prothrombin Time: 19.5 seconds — ABNORMAL HIGH (ref 11.4–15.2)
Smear Review: NONE SEEN

## 2016-06-24 LAB — I-STAT TROPONIN, ED: TROPONIN I, POC: 1.93 ng/mL — AB (ref 0.00–0.08)

## 2016-06-24 LAB — T4, FREE: Free T4: 0.97 ng/dL (ref 0.61–1.12)

## 2016-06-24 LAB — MAGNESIUM: MAGNESIUM: 2.5 mg/dL — AB (ref 1.7–2.4)

## 2016-06-24 MED ORDER — PROPOFOL 1000 MG/100ML IV EMUL
5.0000 ug/kg/min | Freq: Once | INTRAVENOUS | Status: AC
  Administered 2016-06-24: 10 ug/kg/min via INTRAVENOUS
  Filled 2016-06-24: qty 100

## 2016-06-24 MED ORDER — ETOMIDATE 2 MG/ML IV SOLN
20.0000 mg | Freq: Once | INTRAVENOUS | Status: AC
  Administered 2016-06-24: 20 mg via INTRAVENOUS

## 2016-06-24 MED ORDER — PIPERACILLIN-TAZOBACTAM IN DEX 2-0.25 GM/50ML IV SOLN: 2.2500 g | Freq: Once | INTRAVENOUS | Status: DC

## 2016-06-24 MED ORDER — VANCOMYCIN HCL 10 G IV SOLR
1500.0000 mg | Freq: Once | INTRAVENOUS | Status: AC
  Administered 2016-06-24: 1500 mg via INTRAVENOUS
  Filled 2016-06-24: qty 1500

## 2016-06-24 MED ORDER — PIPERACILLIN-TAZOBACTAM 3.375 G IVPB 30 MIN
3.3750 g | Freq: Once | INTRAVENOUS | Status: AC
  Administered 2016-06-24: 3.375 g via INTRAVENOUS
  Filled 2016-06-24: qty 50

## 2016-06-24 MED ORDER — SODIUM CHLORIDE 0.9 % IV BOLUS (SEPSIS)
1000.0000 mL | Freq: Once | INTRAVENOUS | Status: AC
  Administered 2016-06-24: 1000 mL via INTRAVENOUS

## 2016-06-24 MED ORDER — ACETAMINOPHEN 10 MG/ML IV SOLN
1000.0000 mg | Freq: Four times a day (QID) | INTRAVENOUS | Status: DC
  Administered 2016-06-24 – 2016-06-25 (×2): 1000 mg via INTRAVENOUS
  Filled 2016-06-24 (×4): qty 100

## 2016-06-24 MED ORDER — SUCCINYLCHOLINE CHLORIDE 20 MG/ML IJ SOLN
80.0000 mg | Freq: Once | INTRAMUSCULAR | Status: AC
  Administered 2016-06-24: 80 mg via INTRAVENOUS

## 2016-06-24 MED ORDER — VANCOMYCIN HCL IN DEXTROSE 750-5 MG/150ML-% IV SOLN
750.0000 mg | Freq: Two times a day (BID) | INTRAVENOUS | Status: DC
  Administered 2016-06-25: 750 mg via INTRAVENOUS
  Filled 2016-06-24 (×2): qty 150

## 2016-06-24 MED ORDER — ACETAMINOPHEN 650 MG RE SUPP
650.0000 mg | Freq: Once | RECTAL | Status: AC
  Administered 2016-06-24: 650 mg via RECTAL

## 2016-06-24 MED ORDER — METOPROLOL TARTRATE 5 MG/5ML IV SOLN
5.0000 mg | Freq: Once | INTRAVENOUS | Status: AC
  Administered 2016-06-24: 5 mg via INTRAVENOUS
  Filled 2016-06-24: qty 5

## 2016-06-24 NOTE — ED Notes (Signed)
CCM at bedside 

## 2016-06-24 NOTE — ED Triage Notes (Signed)
[facility]- pt here from home, family called out for altered mental status. Pt unresponsive on arrival. Tachycardic at 180. Shocked by EMS. No IV access in place. Living conditions reported to be deplorable. Last seen normal by family 2 weeks ago.

## 2016-06-24 NOTE — H&P (Signed)
PULMONARY / CRITICAL CARE MEDICINE   Name: Wayne [name] MRN: [id number] DOB: 1945-10-27    ADMISSION DATE:  06/10/2016 CONSULTATION DATE:    REFERRING MD:  EDP  CHIEF COMPLAINT:  Altered mental status  HISTORY OF PRESENT ILLNESS:   Mr. Wayne [name] is a 29M with PMH significant for atrial fibrillation (unclear if on Xarelto or not), hypertension, HLD, DMII, COPD, systolic heart failure with EF 35-40%, CAD s/p CABG, tobacco abuse and peripheral vascular disease, who presents to the ED via EMS after having been found altered by his granddaughter. He was last seen at his usual baseline about 2 weeks ago. He lives with his wife who is in poor health; living conditions were apparently quite poor. Per the notes, the patient was unresponsive when EMS arrived. He was noted to be in A fib with RVR with rate 180s; cardioversion was attempted with some improvement in rate, but persistent A fib. On arrival to the ED, he reportedly developed respiratory distress and required urgent intubation. Per nursing he was alert and could say his name, but no additional neuro exam was completed. He received etomidate and succinylcholine for the intubation and was placed on propofol afterwards. He is tolerating the ventilator, but breathing over.   On my arrival, he is sedated. With propofol paused, he withdraws to painful stimuli and seems to have a pain response to palpation of the right lower extremity. The foot is cold, with pulses only identified by doppler. The lower extremities are asymmetric with the right calf larger and warmer than the left. He is febrile to 104. Labs reveal hemoconcentration with Hgb 18, leukopenia with WBC 3.6, left shift with 94% PMNs, CMP not fully resulted, but Cr 1.8 and BUN 51. LFTs pending. PT/INR pending. CXR largely clear, as was CT head. No clear source for sepsis - UA was also benign.  Lactate elevated at 7.25 with repeat climbing to 9.45.  PAST MEDICAL HISTORY :  He  has a past medical  history of Actinic keratosis; Atrial fibrillation ([place]); CAD (coronary artery disease); Carpal tunnel syndrome; Chronic ischemic heart disease; COPD (chronic obstructive pulmonary disease) ([place]); Degeneration of cervical intervertebral disc; Depression; Diabetes mellitus; Hyperlipidemia; Hypertension; Myocardial infarction; Osteoarthrosis, unspecified whether generalized or localized, shoulder region; Other disorder of muscle, ligament, and fascia; Peripheral vascular disease ([place]); and Shortness of breath.  PAST SURGICAL HISTORY: He  has a past surgical history that includes Coronary artery bypass graft; Coronary angioplasty; Carpal tunnel release; and Finger amputation.  Allergies  Allergen Reactions  . Codeine Itching and Rash    No current facility-administered medications on file prior to encounter.    Current Outpatient Prescriptions on File Prior to Encounter  Medication Sig  . lisinopril (PRINIVIL,ZESTRIL) 5 MG tablet Take 5 mg by mouth daily.  . rivaroxaban (XARELTO) 20 MG TABS tablet Take 20 mg by mouth daily with supper.   [name] acetaminophen (TYLENOL) 500 MG tablet Take 1 tablet (500 mg total) by mouth every 6 (six) hours as needed.  . cloNIDine (CATAPRES) 0.1 MG tablet Take 0.1 mg by mouth daily.  . cyclobenzaprine (FLEXERIL) 10 MG tablet Take 10 mg by mouth at bedtime as needed for muscle spasms.   . pioglitazone (ACTOS) 15 MG tablet Take 15 mg by mouth daily.    FAMILY HISTORY:  His indicated that his mother is deceased. He indicated that his father is deceased. He indicated that the status of his brother is unknown.    SOCIAL HISTORY: He  reports that he has  been smoking Cigarettes.  He has a 28.00 pack-year smoking history. He has never used smokeless tobacco. He reports that he does not drink alcohol or use drugs.  REVIEW OF SYSTEMS:   Unable to obtain   SUBJECTIVE:    VITAL SIGNS: BP 90/78   Pulse 65   Temp (!) 104.9 F (40.5 C) (Core (Comment))   Resp (!) 33    Wt 77.1 kg (170 lb)   SpO2 100%   BMI 25.85 kg/m   HEMODYNAMICS:    VENTILATOR SETTINGS: Vent Mode: PRVC FiO2 (%):  [50 %-100 %] 100 % Set Rate:  [20 bmp] 20 bmp Vt Set:  [id number] mL] 620 mL PEEP:  [5 cmH20] 5 cmH20 Plateau Pressure:  [21 cmH20] 21 cmH20  INTAKE / OUTPUT: I/O last 3 completed shifts: In: 1050 [IV Piggyback:1050] Out: 300 [Urine:300]  PHYSICAL EXAMINATION:  General Well developed, thin, intubated, sedated  HEENT No gross abnormalities. OETT and OGT in place. Dry mucous membranes. Poor dentition.   Pulmonary Coarse breath sounds bilaterally on anterior exam, but no overt wheezes, rales or ronchi. Vent-assisted effort, symmetrical expansion.   Cardiovascular Irregularly irregular. Heart tones largely obscured by breath sounds. S1, s2. Radial pulses palpable. L DP palpable. R DP present on doppler.  Abdomen Soft, non-tender, non-distended, positive bowel sounds, no palpable organomegaly or masses. Normoresonant to percussion.  Musculoskeletal R calf swollen, warm, tender with cold R foot. L index finger partial amputation. Otherwise grossly normal.   Lymphatics No cervical, supraclavicular or axillary adenopathy.   Neurologic Exam limited by sedation. Withdraws to noxious stimuli.   Skin/Integuement No rash. R foot pale and cold with trace edema.      LABS:  BMET  Recent Labs Lab 06/22/2016 1645  NA 137  K 4.4  CL 101  CO2 15*  BUN 51*  CREATININE 1.72*  GLUCOSE 270*    Electrolytes  Recent Labs Lab 06/29/2016 1645  CALCIUM 10.1  MG 2.5*  PHOS 1.5*    CBC  Recent Labs Lab 07/07/2016 1645  WBC 3.9*  HGB 18.5*  HCT 53.7*  PLT 76*    Coag's  Recent Labs Lab 07/08/2016 2147  INR 1.53    Sepsis Markers  Recent Labs Lab 06/14/2016 1702 06/12/2016 2143  LATICACIDVEN 7.25* 9.45*    ABG  Recent Labs Lab 06/27/2016 1719 07/03/2016 2019  PHART 7.439 7.384  PCO2ART 25.2* 30.2*  PO2ART 54.0* 93.0    Liver Enzymes  Recent Labs Lab  06/08/2016 1645  AST QUANTITY NOT SUFFICIENT, UNABLE TO PERFORM TEST  ALT QUANTITY NOT SUFFICIENT, UNABLE TO PERFORM TEST  ALKPHOS 132*  BILITOT QUANTITY NOT SUFFICIENT, UNABLE TO PERFORM TEST  ALBUMIN 3.2*    Cardiac Enzymes No results for input(s): TROPONINI, PROBNP in the last 168 hours.  Glucose  Recent Labs Lab 06/16/2016 1815  GLUCAP 249*    Imaging Ct Head Wo Contrast  Result Date: 06/26/2016 CLINICAL DATA:  Acute mental status. Patient unresponsive upon arrival. Enlarged right pupil. EXAM: CT HEAD WITHOUT CONTRAST TECHNIQUE: Contiguous axial images were obtained from the base of the skull through the vertex without intravenous contrast. COMPARISON:  April 22, 2016 FINDINGS: Brain: No subdural, epidural, or subarachnoid hemorrhage. No mass, mass effect, or midline shift. Cerebellum, brainstem, and basal cisterns are normal. Ventricles and sulci are unchanged. Scattered white matter changes are identified. No acute cortical ischemia or infarct is noted. Vascular: Calcified atherosclerosis is seen in the intracranial carotid arteries. Skull: Normal. Negative for fracture or focal lesion. Sinuses/Orbits: There  is mucosal thickening in the sphenoid right maxillary sinuses with no air-fluid levels. Mastoid air cells and middle ears are well aerated. Other: None. IMPRESSION: 1. No acute intracranial abnormality. Electronically Signed   By: Dorise Bullion III M.D   On: 06/21/2016 19:11   Dg Chest Portable 1 View  Result Date: 06/12/2016 CLINICAL DATA:  Code sepsis.  Emergent intubation. EXAM: PORTABLE CHEST 1 VIEW COMPARISON:  06/15/2016 and 04/22/2016 radiographs. FINDINGS: 1958 hours. Two views obtained. Patient has been intubated. Endotracheal tube tip is in the mid trachea. A nasogastric tube has been placed, projecting below the diaphragm. There is stable cardiac enlargement post CABG. The lungs remain clear. No pneumothorax or significant pleural effusion. IMPRESSION: Satisfactory  endotracheal and nasogastric tube placement. Stable cardiomegaly. Electronically Signed   By: Richardean Sale M.D.   On: 06/23/2016 20:29   Dg Chest Portable 1 View  Result Date: 07/06/2016 CLINICAL DATA:  Altered mental status. EXAM: PORTABLE CHEST 1 VIEW COMPARISON:  Chest and rib radiographs 04/22/2016. Chest CT 11/21/2015. FINDINGS: 1700 hour. Mild patient rotation to the right. The heart size and mediastinal contours are stable status post median sternotomy and CABG. The heart is mildly enlarged. The lungs are clear. There is no pleural effusion or pneumothorax. Glenohumeral degenerative changes are present bilaterally. IMPRESSION: Stable mild cardiac enlargement post CABG. No acute cardiopulmonary process. Electronically Signed   By: Richardean Sale M.D.   On: 06/17/2016 17:22   STUDIES:    CULTURES: Blood cx x 2  12/17 >>  ANTIBIOTICS: Vancomycin 12/17 >> Zosyn 12/17 >>  SIGNIFICANT EVENTS:   LINES/TUBES: PIV OETT 12/17 >> OGT 12/17 >>  DISCUSSION: Mr. Gallihugh is a 29M presenting with high fever, a fib with RVR, acute hypoxemic respiratory failure, lactic acidosis, acute kidney injury and R lower extremity abnormalities. It is unclear if he has been compliant with his home Xarelto or any of his other medications. His renal function precludes CT imaging with contrast. His CXR and urine do not reveal a source for presumed sepsis. He was started on vancomycin and zosyn in the ED. His abdomen is scaphoid and non-tender on exam. The only localizing feature is his right lower extremity. With his high fever and altered mental status, CNS infection must be considered, but with unclear adherence to rivaroxaban and INR 1.59, LP will be deferred for now. His CT head was unremarkable, and no localizing signs to suggest CNS.  Will continue empiric Abx coverage and further evaluate the RLE with dopplers while empirically initiating a heparin gtt. PE is also in the differential for his hypoxia, RVR,  and troponin leak.   ASSESSMENT / PLAN:  PULMONARY A: Acute hypoxemic respiratory failure Underlying COPD P:   Continue ventilatory support Wean FiO2 as able SBT when able Empiric heparin gtt Empiric antibiotics DuoNebs q 4h prn  CARDIOVASCULAR A:  A fib with RVR Troponin leak Concern for RLE vascular compromise Systolic dysfunction EF [id number] PVD CAD s/p CABG P:  Rate control goal < 120 Trend troponin Update TTE Evaluate RLE with dopplers (venous & arterial) Empiric heparin gtt Check CPK  RENAL A:   Acute kidney injury  Lactic acidosis P:   Fluid resuscitate Avoid nephrotoxins Renally dose medications I/Os Maintain Foley Trend lactate  GASTROINTESTINAL A:   No acute issues P:   PPI for gi ppx  HEMATOLOGIC A:   Leukopenia, thrombocytopenia with relative erythrocytosis Concern for vascular compromise RLE P:  Fluid resuscitate Heparin gtt Trend CBC  INFECTIOUS A:   Concern  for sepsis; etiology unclear P:   Continue empiric coverage Follow cultures De-escalate as able  ENDOCRINE A:   DMII / hyperglycemia P:   CBG / SSI  NEUROLOGIC A:   Altered mental status / acute toxic-metabolic encephalopathy - etiology unclear P:   RASS goal: 0 to -1 No LP 2/2 unclear anticoagulation status Intermittent sedation with fentanyl / versed; wean as tolerated  FAMILY  - Updates: No family available; daughter (in [place]) updated by nursing. Per their discussion, patient is to remain FULL CODE.  - Inter-disciplinary family meet or Palliative Care meeting due by:  day 7  The patient is critically ill with multiple organ system failure and requires high complexity decision making for assessment and support, frequent evaluation and titration of therapies, advanced monitoring, review of radiographic studies and interpretation of complex data.   Critical Care Time devoted to patient care services, exclusive of separately billable procedures, described in  this note is 54 minutes.   Yisroel Ramming, MD Pulmonary and [place] Pager: [phone]  06/17/2016, 11:19 PM

## 2016-06-24 NOTE — ED Provider Notes (Signed)
[facility] DEPT Provider Note   CSN: [id number] Arrival date & time: 06/12/2016  1646     History   Chief Complaint Chief Complaint  Patient presents with  . Tachycardia  . Altered Mental Status    HPI Wayne [name] is a 70 y.o. male.  The history is provided by the EMS personnel and medical records. The history is limited by the condition of the patient.     This is a 70 year old male with a past medical history of coronary artery disease, COPD, depression, atrial fibrillation who presents today with altered mental status. He arrives by EMS. History is significantly limited due to the patient's condition. Per report given by EMS, the patient was found lying in bed comfortably in his own feces. His wife he has significant dementia was lying next to him. She was easily aroused on EMS arrival. Patient had monitor readings concerning for atrial fibrillation with RVR. Rate was reported to be in the 200s. EMS does relate that they shocked him once using 200 J. No change in the atrial fibrillation was noted. On review the patient's chart he does have a history of being on Xarelto for his atrial fibrillation. He does also have a history of dementia as well. I attempted to call family and his wife answered to have obvious findings concerning for dementia and was unable to provide a clear history for the patient.  Past Medical History:  Diagnosis Date  . Actinic keratosis   . Atrial fibrillation ([place])   . CAD (coronary artery disease)   . Carpal tunnel syndrome   . Chronic ischemic heart disease   . COPD (chronic obstructive pulmonary disease) ([place])   . Degeneration of cervical intervertebral disc   . Depression   . Diabetes mellitus    type 2  . Hyperlipidemia   . Hypertension   . Myocardial infarction   . Osteoarthrosis, unspecified whether generalized or localized, shoulder region   . Other disorder of muscle, ligament, and fascia   . Peripheral vascular disease ([place])   .  Shortness of breath     Patient Active Problem List   Diagnosis Date Noted  . Cerebral thrombosis with cerebral infarction ([place]) 08/20/2014  . Numbness on right side 08/19/2014  . Dementia 08/19/2014  . Right sided weakness 08/19/2014  . COPD, moderate ([place]) 06/07/2014  . Smoking 06/07/2014  . Nodule of right lung 03/03/2014  . Chest pain 05/15/2012  . Shoulder pain 05/15/2012  . RECTAL BLEEDING, HX OF 10/03/2010  . SEBORRHEIC KERATOSIS 07/11/2010  . FACIAL PAIN 04/06/2010  . ACTINIC KERATOSIS, HEAD 05/12/2009  . NEOPLASM OF UNCERTAIN BEHAVIOR OF SKIN 02/04/2009  . DEPRESSION 02/04/2009  . OSTEOARTHRITIS, SHOULDER, LEFT 09/17/2008  . DEGENERATIVE DISC DISEASE, CERVICAL SPINE 09/17/2008  . DIABETES MELLITUS, TYPE II, UNCONTROLLED 11/14/2007  . Essential hypertension 11/14/2007  . CARPAL TUNNEL SYNDROME, RIGHT 08/07/2007  . COPD 08/07/2007  . CARPAL TUNNEL RELEASE, LEFT, HX OF 08/07/2007  . BRONCHITIS 06/13/2007  . DYSLIPIDEMIA 03/24/2007  . NICOTINE ADDICTION 03/24/2007  . Coronary atherosclerosis 03/24/2007  . DISEASE, ISCHEMIC HEART, CHRONIC NEC 03/24/2007  . FIBRILLATION, ATRIAL 03/24/2007  . Peripheral vascular disease, unspecified 03/24/2007  . PERCUTANEOUS TRANSLUMINAL CORONARY ANGIOPLASTY, HX OF 10/04/2003    Past Surgical History:  Procedure Laterality Date  . CARPAL TUNNEL RELEASE    . CORONARY ANGIOPLASTY    . CORONARY ARTERY BYPASS GRAFT    . FINGER AMPUTATION         Home Medications  Prior to Admission medications   Medication Sig Start Date End Date Taking? Authorizing Provider  lisinopril (PRINIVIL,ZESTRIL) 5 MG tablet Take 5 mg by mouth daily.   Yes Historical Provider, MD  rivaroxaban (XARELTO) 20 MG TABS tablet Take 20 mg by mouth daily with supper.    Yes Historical Provider, MD  acetaminophen (TYLENOL) 500 MG tablet Take 1 tablet (500 mg total) by mouth every 6 (six) hours as needed. 04/22/16   Samantha Tripp Dowless, PA-C  cloNIDine  (CATAPRES) 0.1 MG tablet Take 0.1 mg by mouth daily.    Historical Provider, MD  cyclobenzaprine (FLEXERIL) 10 MG tablet Take 10 mg by mouth at bedtime as needed for muscle spasms.  07/20/14   Historical Provider, MD  pioglitazone (ACTOS) 15 MG tablet Take 15 mg by mouth daily.    Historical Provider, MD    Family History Family History  Problem Relation Age of Onset  . Diabetes Mother   . Pneumonia Father   . Gout Brother     Social History Social History  Substance Use Topics  . Smoking status: Current Every Day Smoker    Packs/day: 0.50    Years: 56.00    Types: Cigarettes  . Smokeless tobacco: Never Used  . Alcohol use No     Allergies   Codeine   Review of Systems Review of Systems  Unable to perform ROS: Acuity of condition     Physical Exam Updated Vital Signs BP 98/72   Pulse (!) 34   Temp (!) 104.9 F (40.5 C) (Core (Comment))   Resp (!) 34   Wt 77.1 kg   SpO2 99%   BMI 25.85 kg/m   Physical Exam  Constitutional: He appears ill. He appears distressed.  Elderly and frail appearing.  HENT:  Head: Normocephalic and atraumatic.  Mouth/Throat: Mucous membranes are dry. No oropharyngeal exudate.  Eyes: Conjunctivae are normal.  R pupil has obvious defect which appears to be c/w prior eye procedure. L pupil is 1 mm and appears to be sluggish.   Neck: Neck supple.  Cardiovascular: An irregularly irregular rhythm present. Tachycardia present.   No murmur heard. Pulmonary/Chest: He is in respiratory distress. He has wheezes (diffuse and course).  Abdominal: Soft. There is no tenderness.  Musculoskeletal: He exhibits edema.  Neurological: GCS eye subscore is 4. GCS verbal subscore is 1. GCS motor subscore is 4.  Skin: Skin is dry.  Extremities are cold to touch, core is warm.  Psychiatric: He has a normal mood and affect.  Nursing note and vitals reviewed.    ED Treatments / Results  Labs (all labs ordered are listed, but only abnormal results are  displayed) Labs Reviewed  CBC WITH DIFFERENTIAL/PLATELET - Abnormal; Notable for the following:       Result Value   WBC 3.9 (*)    RBC 6.00 (*)    Hemoglobin 18.5 (*)    HCT 53.7 (*)    Platelets 76 (*)    Lymphs Abs 0.2 (*)    All other components within normal limits  URINALYSIS, ROUTINE W REFLEX MICROSCOPIC - Abnormal; Notable for the following:    Color, Urine AMBER (*)    APPearance HAZY (*)    Glucose, UA 50 (*)    Hgb urine dipstick MODERATE (*)    Bilirubin Urine SMALL (*)    Ketones, ur 5 (*)    Protein, ur 100 (*)    Bacteria, UA FEW (*)    Squamous Epithelial / LPF 0-5 (*)  Crystals PRESENT (*)    All other components within normal limits  MAGNESIUM - Abnormal; Notable for the following:    Magnesium 2.5 (*)    All other components within normal limits  PHOSPHORUS - Abnormal; Notable for the following:    Phosphorus 1.5 (*)    All other components within normal limits  PROTIME-INR - Abnormal; Notable for the following:    Prothrombin Time 18.5 (*)    All other components within normal limits  COMPREHENSIVE METABOLIC PANEL - Abnormal; Notable for the following:    CO2 15 (*)    Glucose, Bld 270 (*)    BUN 51 (*)    Creatinine, Ser 1.72 (*)    Albumin 3.2 (*)    Alkaline Phosphatase 132 (*)    Anion gap 21 (*)    All other components within normal limits  DIC (DISSEMINATED INTRAVASCULAR COAGULATION) PANEL - Abnormal; Notable for the following:    Prothrombin Time 19.5 (*)    aPTT 43 (*)    Fibrinogen 675 (*)    D-Dimer, Quant >20.00 (*)    Platelets 48 (*)    All other components within normal limits  I-STAT CG4 LACTIC ACID, ED - Abnormal; Notable for the following:    Lactic Acid, Venous 7.25 (*)    All other components within normal limits  I-STAT TROPOININ, ED - Abnormal; Notable for the following:    Troponin i, poc 1.93 (*)    All other components within normal limits  CBG MONITORING, ED - Abnormal; Notable for the following:     Glucose-Capillary 249 (*)    All other components within normal limits  I-STAT ARTERIAL BLOOD GAS, ED - Abnormal; Notable for the following:    pCO2 arterial 25.2 (*)    pO2, Arterial 54.0 (*)    Bicarbonate 16.6 (*)    Acid-base deficit 4.0 (*)    All other components within normal limits  I-STAT CG4 LACTIC ACID, ED - Abnormal; Notable for the following:    Lactic Acid, Venous 9.45 (*)    All other components within normal limits  I-STAT ARTERIAL BLOOD GAS, ED - Abnormal; Notable for the following:    pCO2 arterial 30.2 (*)    Bicarbonate 17.5 (*)    Acid-base deficit 5.0 (*)    All other components within normal limits  URINE CULTURE  CULTURE, BLOOD (ROUTINE X 2)  CULTURE, BLOOD (ROUTINE X 2)  ETHANOL  TSH  AMMONIA  T4, FREE  CK  COMPREHENSIVE METABOLIC PANEL  I-STAT CG4 LACTIC ACID, ED    EKG  EKG Interpretation  Date/Time:  Sunday June 24 2016 17:30:09 EST Ventricular Rate:  119 PR Interval:    QRS Duration: 108 QT Interval:  311 QTC Calculation: 438 R Axis:   90 Text Interpretation:  Atrial fibrillation Borderline right axis deviation Repol abnrm suggests ischemia, anterolateral Artifact When compared with ECG of 05/20/2015, HEART RATE has increased T wave inversion is now Present Anterolateral leads Confirmed by Roxanne Mins  MD, DAVID ([id number]) on 06/11/2016 5:36:55 PM       Radiology Ct Head Wo Contrast  Result Date: 06/18/2016 CLINICAL DATA:  Acute mental status. Patient unresponsive upon arrival. Enlarged right pupil. EXAM: CT HEAD WITHOUT CONTRAST TECHNIQUE: Contiguous axial images were obtained from the base of the skull through the vertex without intravenous contrast. COMPARISON:  April 22, 2016 FINDINGS: Brain: No subdural, epidural, or subarachnoid hemorrhage. No mass, mass effect, or midline shift. Cerebellum, brainstem, and basal cisterns are normal. Ventricles and  sulci are unchanged. Scattered white matter changes are identified. No acute cortical  ischemia or infarct is noted. Vascular: Calcified atherosclerosis is seen in the intracranial carotid arteries. Skull: Normal. Negative for fracture or focal lesion. Sinuses/Orbits: There is mucosal thickening in the sphenoid right maxillary sinuses with no air-fluid levels. Mastoid air cells and middle ears are well aerated. Other: None. IMPRESSION: 1. No acute intracranial abnormality. Electronically Signed   By: Dorise Bullion III M.D   On: 07/07/2016 19:11   Dg Chest Portable 1 View  Result Date: 07/07/2016 CLINICAL DATA:  Code sepsis.  Emergent intubation. EXAM: PORTABLE CHEST 1 VIEW COMPARISON:  07/06/2016 and 04/22/2016 radiographs. FINDINGS: 1958 hours. Two views obtained. Patient has been intubated. Endotracheal tube tip is in the mid trachea. A nasogastric tube has been placed, projecting below the diaphragm. There is stable cardiac enlargement post CABG. The lungs remain clear. No pneumothorax or significant pleural effusion. IMPRESSION: Satisfactory endotracheal and nasogastric tube placement. Stable cardiomegaly. Electronically Signed   By: Richardean Sale M.D.   On: 06/27/2016 20:29   Dg Chest Portable 1 View  Result Date: 07/01/2016 CLINICAL DATA:  Altered mental status. EXAM: PORTABLE CHEST 1 VIEW COMPARISON:  Chest and rib radiographs 04/22/2016. Chest CT 11/21/2015. FINDINGS: 1700 hour. Mild patient rotation to the right. The heart size and mediastinal contours are stable status post median sternotomy and CABG. The heart is mildly enlarged. The lungs are clear. There is no pleural effusion or pneumothorax. Glenohumeral degenerative changes are present bilaterally. IMPRESSION: Stable mild cardiac enlargement post CABG. No acute cardiopulmonary process. Electronically Signed   By: Richardean Sale M.D.   On: 07/08/2016 17:22    Procedures Procedure Name: Intubation Date/Time: 07/07/2016 11:49 PM Performed by: Theodosia Quay Pre-anesthesia Checklist: Patient identified and Emergency  Drugs available Oxygen Delivery Method: Non-rebreather mask Intubation Type: Rapid sequence Ventilation: Mask ventilation with difficulty Laryngoscope Size: 4 Tube size: 7.5 mm Number of attempts: 2 Airway Equipment and Method: Rigid stylet and Video-laryngoscopy Tube secured with: ETT holder Difficulty Due To: Difficulty was anticipated      (including critical care time)  Medications Ordered in ED Medications  vancomycin (VANCOCIN) IVPB 750 mg/150 ml premix (not administered)  acetaminophen (OFIRMEV) IV 1,000 mg (0 mg Intravenous Stopped 06/13/2016 2240)  sodium chloride 0.9 % bolus 1,000 mL (0 mLs Intravenous Stopped 06/23/2016 1808)  acetaminophen (TYLENOL) suppository 650 mg (650 mg Rectal Given 07/05/2016 1706)  piperacillin-tazobactam (ZOSYN) IVPB 3.375 g (0 g Intravenous Stopped 07/06/2016 1838)  vancomycin (VANCOCIN) 1,500 mg in sodium chloride 0.9 % 500 mL IVPB (0 mg Intravenous Stopped 06/18/2016 2119)  metoprolol (LOPRESSOR) injection 5 mg (5 mg Intravenous Given 07/06/2016 1719)  sodium chloride 0.9 % bolus 1,000 mL (0 mLs Intravenous Stopped 06/18/2016 2139)  etomidate (AMIDATE) injection 20 mg (20 mg Intravenous Given 06/12/2016 1952)  succinylcholine (ANECTINE) injection 80 mg (80 mg Intravenous Given 06/18/2016 1953)  propofol (DIPRIVAN) 1000 MG/100ML infusion (5 mcg/kg/min  77.1 kg Intravenous Rate/Dose Change 06/20/2016 2032)  sodium chloride 0.9 % bolus 1,000 mL (0 mLs Intravenous Stopped 06/28/2016 2328)     Initial Impression / Assessment and Plan / ED Course  I have reviewed the triage vital signs and the nursing notes.  Pertinent labs & imaging results that were available during my care of the patient were reviewed by me and considered in my medical decision making (see chart for details).  Clinical Course     70 year old male presents today with altered mental status after being found down at  home in his bed. On arrival the patient is ill-appearing with tachycardia. Blood  pressure is stable. We assessed his rectal temperature which was 104.10F. Given his significant change in mental status and fever I initiated code sepsis. Patient was given broad-spectrum antibiotics. I did note that on a previous echo the patient has a reduced ejection fraction. I gave him fluid in 1 L boluses and reassessed each time. He is received approximately 3 L at this point.  Obtained CT head as the patient is on anticoagulation and I considered this could be secondary to intracranial bleed. No acute abnormalities were appreciated on the feet today. No opacities were noted on chest x-ray. I did consider that he may not be showing findings consistent with a pneumonia given his intravascular depletion.  Differential also includes: hyperammonemia, uremia secondary to AKI, myxedema coma.   TSH is within normal limits, doubt endocrine etiology. Ammonia is within normal limits. Creatinine is 1.72 which is up from his baseline and likely pre-renal. BUN is elevated but not to the point that I would consider this the primary cause of his symptoms today.   Initial lactic acid is 7.25, after 2L fluid it was recheck and continues to trend upward to 9.   Given his continued tachypnea, worsening acidosis and altered mental status we did proceed with intubation in order to prevent.   Discussed with critical care who will admit the patient for further evaluation and management. Patient remains in critical condition.   Final Clinical Impressions(s) / ED Diagnoses   Final diagnoses:  Septic shock ([place])  Atrial fibrillation with rapid ventricular response [facility])    New Prescriptions New Prescriptions   No medications on file     Theodosia Quay, MD [phone] [id number]    David Glick, MD [id number]

## 2016-06-24 NOTE — ED Notes (Signed)
Pulse oximeter placed on various locations, having difficulty obtaining a sat

## 2016-06-24 NOTE — ED Notes (Signed)
Pt lethargic and tachypneic. Fever 104. Resident and Dr.Glick at bedside for intubation

## 2016-06-24 NOTE — ED Notes (Signed)
[facility] contacted to activate Code Sepsis 

## 2016-06-24 NOTE — ED Notes (Signed)
Spoke to Dr.Nagpal concerning pt's heart rate (rate 150-180), no new orders at this time

## 2016-06-24 NOTE — Progress Notes (Signed)
Pharmacy Antibiotic Note  Wayne [name] is a 70 y.o. male admitted on 06/16/2016 with AMS > code sepsis, Tm 104. Starting empiric broad spectrum abx.   Plan: -Vancomycin 1500 mg IV x1 then 750/12h -Monitor renal fx, cultures, VT as indicated     Temp (24hrs), Avg:104.2 F (40.1 C), Min:104.2 F (40.1 C), Max:104.2 F (40.1 C)   Recent Labs Lab 07/04/2016 1702  LATICACIDVEN 7.25*    CrCl cannot be calculated (Patient's most recent lab result is older than the maximum 21 days allowed.).    Allergies  Allergen Reactions  . Codeine Itching and Rash    Antimicrobials this admission: 12/17 zosyn x1 12/17 vancomycin >  Dose adjustments this admission: N/A  Microbiology results: 12/17 blood cx: 12/17 urine cx:  Thank you for allowing pharmacy to be a part of this patient's care.  Harvel Quale 06/24/2016 5:16 PM

## 2016-06-24 NOTE — ED Notes (Signed)
Spoke to [employer], daughter, [phone] requesting an update

## 2016-06-24 NOTE — ED Notes (Signed)
Patient glucose is 249

## 2016-06-25 ENCOUNTER — Inpatient Hospital Stay (HOSPITAL_COMMUNITY): Payer: Medicare Other

## 2016-06-25 ENCOUNTER — Telehealth: Payer: Self-pay | Admitting: Internal Medicine

## 2016-06-25 DIAGNOSIS — M7989 Other specified soft tissue disorders: Secondary | ICD-10-CM

## 2016-06-25 DIAGNOSIS — I4891 Unspecified atrial fibrillation: Secondary | ICD-10-CM

## 2016-06-25 DIAGNOSIS — A419 Sepsis, unspecified organism: Secondary | ICD-10-CM

## 2016-06-25 DIAGNOSIS — I469 Cardiac arrest, cause unspecified: Secondary | ICD-10-CM

## 2016-06-25 DIAGNOSIS — Z789 Other specified health status: Secondary | ICD-10-CM

## 2016-06-25 DIAGNOSIS — J9601 Acute respiratory failure with hypoxia: Secondary | ICD-10-CM

## 2016-06-25 DIAGNOSIS — R6521 Severe sepsis with septic shock: Secondary | ICD-10-CM

## 2016-06-25 LAB — BLOOD CULTURE ID PANEL (REFLEXED)
ACINETOBACTER BAUMANNII: NOT DETECTED
CANDIDA ALBICANS: NOT DETECTED
CANDIDA TROPICALIS: NOT DETECTED
Candida glabrata: NOT DETECTED
Candida krusei: NOT DETECTED
Candida parapsilosis: NOT DETECTED
ENTEROBACTERIACEAE SPECIES: NOT DETECTED
ENTEROCOCCUS SPECIES: NOT DETECTED
Enterobacter cloacae complex: NOT DETECTED
Escherichia coli: NOT DETECTED
HAEMOPHILUS INFLUENZAE: NOT DETECTED
KLEBSIELLA PNEUMONIAE: NOT DETECTED
Klebsiella oxytoca: NOT DETECTED
Listeria monocytogenes: NOT DETECTED
NEISSERIA MENINGITIDIS: NOT DETECTED
Proteus species: NOT DETECTED
Pseudomonas aeruginosa: NOT DETECTED
STREPTOCOCCUS AGALACTIAE: NOT DETECTED
STREPTOCOCCUS SPECIES: DETECTED — AB
Serratia marcescens: NOT DETECTED
Staphylococcus aureus (BCID): NOT DETECTED
Staphylococcus species: NOT DETECTED
Streptococcus pneumoniae: NOT DETECTED
Streptococcus pyogenes: DETECTED — AB

## 2016-06-25 LAB — CBG MONITORING, ED: GLUCOSE-CAPILLARY: 115 mg/dL — AB (ref 65–99)

## 2016-06-25 LAB — LACTIC ACID, PLASMA
LACTIC ACID, VENOUS: 16.2 mmol/L — AB (ref 0.5–1.9)
LACTIC ACID, VENOUS: 18.3 mmol/L — AB (ref 0.5–1.9)

## 2016-06-25 LAB — COMPREHENSIVE METABOLIC PANEL
ALK PHOS: 74 U/L (ref 38–126)
ALT: 26 U/L (ref 17–63)
ALT: 34 U/L (ref 17–63)
ALT: 508 U/L — ABNORMAL HIGH (ref 17–63)
ANION GAP: 16 — AB (ref 5–15)
ANION GAP: 16 — AB (ref 5–15)
AST: 112 U/L — ABNORMAL HIGH (ref 15–41)
AST: 144 U/L — ABNORMAL HIGH (ref 15–41)
AST: 1470 U/L — ABNORMAL HIGH (ref 15–41)
Albumin: 2.4 g/dL — ABNORMAL LOW (ref 3.5–5.0)
Albumin: 2 g/dL — ABNORMAL LOW (ref 3.5–5.0)
Albumin: 1.6 g/dL — ABNORMAL LOW (ref 3.5–5.0)
Alkaline Phosphatase: 45 U/L (ref 38–126)
Alkaline Phosphatase: 77 U/L (ref 38–126)
BUN: 50 mg/dL — ABNORMAL HIGH (ref 6–20)
BUN: 51 mg/dL — ABNORMAL HIGH (ref 6–20)
BUN: 53 mg/dL — ABNORMAL HIGH (ref 6–20)
CALCIUM: 8.1 mg/dL — AB (ref 8.9–10.3)
CALCIUM: 9.1 mg/dL (ref 8.9–10.3)
CHLORIDE: 105 mmol/L (ref 101–111)
CHLORIDE: 109 mmol/L (ref 101–111)
CO2: 17 mmol/L — ABNORMAL LOW (ref 22–32)
CO2: 20 mmol/L — AB (ref 22–32)
Calcium: 7.4 mg/dL — ABNORMAL LOW (ref 8.9–10.3)
Chloride: 107 mmol/L (ref 101–111)
Creatinine, Ser: 1.71 mg/dL — ABNORMAL HIGH (ref 0.61–1.24)
Creatinine, Ser: 2.16 mg/dL — ABNORMAL HIGH (ref 0.61–1.24)
Creatinine, Ser: 2.8 mg/dL — ABNORMAL HIGH (ref 0.61–1.24)
GFR calc non Af Amer: 39 mL/min — ABNORMAL LOW (ref >60)
GFR calc non Af Amer: 29 mL/min — ABNORMAL LOW (ref >60)
GFR calc non Af Amer: 21 mL/min — ABNORMAL LOW (ref >60)
GFR, EST AFRICAN AMERICAN: 25 mL/min — AB (ref >60)
GFR, EST AFRICAN AMERICAN: 34 mL/min — AB (ref >60)
GFR, EST AFRICAN AMERICAN: 45 mL/min — AB (ref >60)
Glucose, Bld: 157 mg/dL — ABNORMAL HIGH (ref 65–99)
Glucose, Bld: 115 mg/dL — ABNORMAL HIGH (ref 65–99)
Glucose, Bld: 175 mg/dL — ABNORMAL HIGH (ref 65–99)
POTASSIUM: 3.6 mmol/L (ref 3.5–5.1)
POTASSIUM: 5 mmol/L (ref 3.5–5.1)
POTASSIUM: 5.6 mmol/L — AB (ref 3.5–5.1)
SODIUM: 138 mmol/L (ref 135–145)
SODIUM: 141 mmol/L (ref 135–145)
SODIUM: 143 mmol/L (ref 135–145)
Total Bilirubin: 3.6 mg/dL — ABNORMAL HIGH (ref 0.3–1.2)
Total Bilirubin: 4.2 mg/dL — ABNORMAL HIGH (ref 0.3–1.2)
Total Bilirubin: 3.4 mg/dL — ABNORMAL HIGH (ref 0.3–1.2)
Total Protein: 6 g/dL — ABNORMAL LOW (ref 6.5–8.1)
Total Protein: 4.9 g/dL — ABNORMAL LOW (ref 6.5–8.1)
Total Protein: 4.1 g/dL — ABNORMAL LOW (ref 6.5–8.1)

## 2016-06-25 LAB — CBC
HEMATOCRIT: 48.7 % (ref 39.0–52.0)
HEMOGLOBIN: 17 g/dL (ref 13.0–17.0)
MCH: 32.3 pg (ref 26.0–34.0)
MCHC: 34.9 g/dL (ref 30.0–36.0)
MCV: 92.4 fL (ref 78.0–100.0)
PLATELETS: 90 10*3/uL — AB (ref 150–400)
RBC: 5.27 MIL/uL (ref 4.22–5.81)
RDW: 15.4 % (ref 11.5–15.5)
WBC: 6.9 10*3/uL (ref 4.0–10.5)

## 2016-06-25 LAB — POCT I-STAT 3, ART BLOOD GAS (G3+)
ACID-BASE DEFICIT: 13 mmol/L — AB (ref 0.0–2.0)
BICARBONATE: 13.2 mmol/L — AB (ref 20.0–28.0)
O2 Saturation: 100 %
PH ART: 7.183 — AB (ref 7.350–7.450)
TCO2: 14 mmol/L (ref 0–100)
pCO2 arterial: 36.4 mmHg (ref 32.0–48.0)
pO2, Arterial: 237 mmHg — ABNORMAL HIGH (ref 83.0–108.0)

## 2016-06-25 LAB — URINE CULTURE: Culture: NO GROWTH

## 2016-06-25 LAB — GLUCOSE, CAPILLARY
GLUCOSE-CAPILLARY: 160 mg/dL — AB (ref 65–99)
GLUCOSE-CAPILLARY: 98 mg/dL (ref 65–99)
Glucose-Capillary: 123 mg/dL — ABNORMAL HIGH (ref 65–99)
Glucose-Capillary: 156 mg/dL — ABNORMAL HIGH (ref 65–99)
Glucose-Capillary: 221 mg/dL — ABNORMAL HIGH (ref 65–99)

## 2016-06-25 LAB — TROPONIN I
TROPONIN I: 6.17 ng/mL — AB (ref <0.03)
Troponin I: 3.11 ng/mL (ref <0.03)

## 2016-06-25 LAB — I-STAT CG4 LACTIC ACID, ED: LACTIC ACID, VENOUS: 9.92 mmol/L — AB (ref 0.5–1.9)

## 2016-06-25 LAB — ECHOCARDIOGRAM COMPLETE
CHL CUP RV SYS PRESS: 31 mmHg
FS: 10 % — AB (ref 28–44)
Height: 68 in
IV/PV OW: 1.11
LA diam index: 2.25 cm/m2
LA vol index: 39.3 mL/m2
LA vol: 75.1 mL
LASIZE: 43 mm
LAVOLA4C: 67.4 mL
LEFT ATRIUM END SYS DIAM: 43 mm
LVOT area: 3.8 cm2
LVOTD: 22 mm
PW: 11.7 mm — AB (ref 0.6–1.1)
Reg peak vel: 265 cm/s
TR max vel: 265 cm/s
WEIGHTICAEL: 2737.23 [oz_av]

## 2016-06-25 LAB — MRSA PCR SCREENING: MRSA by PCR: NEGATIVE

## 2016-06-25 LAB — APTT

## 2016-06-25 LAB — PHOSPHORUS: PHOSPHORUS: 1.6 mg/dL — AB (ref 2.5–4.6)

## 2016-06-25 LAB — MAGNESIUM: Magnesium: 2.4 mg/dL (ref 1.7–2.4)

## 2016-06-25 LAB — BRAIN NATRIURETIC PEPTIDE: B Natriuretic Peptide: 1662.6 pg/mL — ABNORMAL HIGH (ref 0.0–100.0)

## 2016-06-25 LAB — HEPARIN LEVEL (UNFRACTIONATED): HEPARIN UNFRACTIONATED: 0.24 [IU]/mL — AB (ref 0.30–0.70)

## 2016-06-25 LAB — CK: Total CK: 1492 U/L — ABNORMAL HIGH (ref 49–397)

## 2016-06-25 MED ORDER — STERILE WATER FOR INJECTION IV SOLN
INTRAVENOUS | Status: DC
  Administered 2016-06-25: 09:00:00 via INTRAVENOUS
  Filled 2016-06-25 (×2): qty 850

## 2016-06-25 MED ORDER — PANTOPRAZOLE SODIUM 40 MG IV SOLR
40.0000 mg | Freq: Every day | INTRAVENOUS | Status: DC
  Administered 2016-06-25: 40 mg via INTRAVENOUS
  Filled 2016-06-25: qty 40

## 2016-06-25 MED ORDER — SODIUM CHLORIDE 0.9 % IV SOLN
INTRAVENOUS | Status: DC
  Administered 2016-06-25: 03:00:00 via INTRAVENOUS

## 2016-06-25 MED ORDER — MIDAZOLAM HCL 2 MG/2ML IJ SOLN: 1.0000 mg | INTRAMUSCULAR | Status: DC | PRN

## 2016-06-25 MED ORDER — HYDROCORTISONE NA SUCCINATE PF 100 MG IJ SOLR: 50.0000 mg | Freq: Four times a day (QID) | INTRAMUSCULAR | Status: DC

## 2016-06-25 MED ORDER — INSULIN ASPART 100 UNIT/ML ~~LOC~~ SOLN
2.0000 [IU] | SUBCUTANEOUS | Status: DC
  Administered 2016-06-25 (×2): 4 [IU] via SUBCUTANEOUS

## 2016-06-25 MED ORDER — SODIUM BICARBONATE 8.4 % IV SOLN
INTRAVENOUS | Status: AC
  Administered 2016-06-25: 04:00:00
  Filled 2016-06-25: qty 100

## 2016-06-25 MED ORDER — SODIUM CHLORIDE 0.9 % IV BOLUS (SEPSIS)
1000.0000 mL | Freq: Once | INTRAVENOUS | Status: AC
  Administered 2016-06-25: 1000 mL via INTRAVENOUS

## 2016-06-25 MED ORDER — PHENYLEPHRINE HCL 10 MG/ML IJ SOLN
30.0000 ug/min | INTRAMUSCULAR | Status: DC
  Administered 2016-06-25 (×3): 200 ug/min via INTRAVENOUS
  Filled 2016-06-25 (×4): qty 4

## 2016-06-25 MED ORDER — MORPHINE BOLUS VIA INFUSION
5.0000 mg | INTRAVENOUS | Status: DC | PRN
  Filled 2016-06-25: qty 20

## 2016-06-25 MED ORDER — VANCOMYCIN HCL IN DEXTROSE 750-5 MG/150ML-% IV SOLN: 750.0000 mg | INTRAVENOUS | Status: DC

## 2016-06-25 MED ORDER — AMIODARONE HCL IN DEXTROSE 360-4.14 MG/200ML-% IV SOLN
INTRAVENOUS | Status: AC
  Administered 2016-06-25: 150 mg via INTRAVENOUS
  Filled 2016-06-25: qty 200

## 2016-06-25 MED ORDER — HEPARIN (PORCINE) IN NACL 100-0.45 UNIT/ML-% IJ SOLN
1100.0000 [IU]/h | INTRAMUSCULAR | Status: DC
  Administered 2016-06-25: 1100 [IU]/h via INTRAVENOUS
  Filled 2016-06-25 (×2): qty 250

## 2016-06-25 MED ORDER — CLINDAMYCIN PHOSPHATE 600 MG/50ML IV SOLN
600.0000 mg | Freq: Three times a day (TID) | INTRAVENOUS | Status: DC
  Administered 2016-06-25: 600 mg via INTRAVENOUS
  Filled 2016-06-25 (×3): qty 50

## 2016-06-25 MED ORDER — AMIODARONE LOAD VIA INFUSION
150.0000 mg | Freq: Once | INTRAVENOUS | Status: AC
  Administered 2016-06-25: 150 mg via INTRAVENOUS
  Filled 2016-06-25: qty 83.34

## 2016-06-25 MED ORDER — FENTANYL CITRATE (PF) 100 MCG/2ML IJ SOLN: 50.0000 ug | INTRAMUSCULAR | Status: DC | PRN

## 2016-06-25 MED ORDER — SODIUM CHLORIDE 0.9 % IV SOLN: 250.0000 mL | INTRAVENOUS | Status: DC | PRN

## 2016-06-25 MED ORDER — EPINEPHRINE PF 1 MG/ML IJ SOLN
0.5000 ug/min | INTRAVENOUS | Status: DC
  Administered 2016-06-25: 2 ug/min via INTRAVENOUS
  Administered 2016-06-25: 20 ug/min via INTRAVENOUS
  Filled 2016-06-25 (×4): qty 4

## 2016-06-25 MED ORDER — CLINDAMYCIN PHOSPHATE 900 MG/50ML IV SOLN
900.0000 mg | Freq: Three times a day (TID) | INTRAVENOUS | Status: DC
  Filled 2016-06-25 (×2): qty 50

## 2016-06-25 MED ORDER — AMIODARONE HCL IN DEXTROSE 360-4.14 MG/200ML-% IV SOLN
60.0000 mg/h | INTRAVENOUS | Status: DC
  Administered 2016-06-25: 60 mg/h via INTRAVENOUS
  Filled 2016-06-25: qty 200

## 2016-06-25 MED ORDER — NOREPINEPHRINE BITARTRATE 1 MG/ML IV SOLN
2.0000 ug/min | INTRAVENOUS | Status: DC
  Administered 2016-06-25: 20 ug/min via INTRAVENOUS
  Filled 2016-06-25 (×2): qty 16

## 2016-06-25 MED ORDER — SODIUM CHLORIDE 0.9 % IV SOLN
10.0000 mg/h | INTRAVENOUS | Status: DC
  Administered 2016-06-25: 10 mg/h via INTRAVENOUS
  Filled 2016-06-25: qty 5
  Filled 2016-06-25: qty 10

## 2016-06-25 MED ORDER — PHENYLEPHRINE HCL 10 MG/ML IJ SOLN
30.0000 ug/min | INTRAVENOUS | Status: DC
  Administered 2016-06-25: 60 ug/min via INTRAVENOUS
  Administered 2016-06-25: 200 ug/min via INTRAVENOUS
  Filled 2016-06-25 (×2): qty 1

## 2016-06-25 MED ORDER — PENICILLIN G POTASSIUM 5000000 UNITS IJ SOLR
4.0000 10*6.[IU] | Freq: Three times a day (TID) | INTRAVENOUS | Status: DC
  Filled 2016-06-25 (×2): qty 4

## 2016-06-25 MED ORDER — AMIODARONE HCL IN DEXTROSE 360-4.14 MG/200ML-% IV SOLN
30.0000 mg/h | INTRAVENOUS | Status: DC
  Administered 2016-06-25: 30 mg/h via INTRAVENOUS
  Filled 2016-06-25 (×2): qty 200

## 2016-06-26 ENCOUNTER — Inpatient Hospital Stay: Admission: RE | Admit: 2016-06-26 | Payer: Medicare Other | Source: Ambulatory Visit

## 2016-06-27 LAB — CBC WITH DIFFERENTIAL/PLATELET
BASOS PCT: 0 %
Basophils Absolute: 0 10*3/uL (ref 0.0–0.1)
EOS PCT: 0 %
Eosinophils Absolute: 0 10*3/uL (ref 0.0–0.7)
HEMATOCRIT: 45.5 % (ref 39.0–52.0)
HEMOGLOBIN: 14.6 g/dL (ref 13.0–17.0)
Lymphocytes Relative: 9 %
Lymphs Abs: 0.8 10*3/uL (ref 0.7–4.0)
MCH: 30.6 pg (ref 26.0–34.0)
MCHC: 32.1 g/dL (ref 30.0–36.0)
MCV: 95.4 fL (ref 78.0–100.0)
MONO ABS: 0.2 10*3/uL (ref 0.1–1.0)
MONOS PCT: 2 %
NEUTROS PCT: 89 %
Neutro Abs: 8.1 10*3/uL — ABNORMAL HIGH (ref 1.7–7.7)
PLATELETS: 26 10*3/uL — AB (ref 150–400)
RBC: 4.77 MIL/uL (ref 4.22–5.81)
RDW: 16.2 % — ABNORMAL HIGH (ref 11.5–15.5)
WBC: 9.1 10*3/uL (ref 4.0–10.5)

## 2016-06-27 NOTE — Discharge Summary (Signed)
NAME:  Wayne [name], Wayne [name] NO.:  [id number]  MEDICAL RECORD NO.:  [id number]  LOCATION:                               FACILITY:  [facility]  PHYSICIAN:  Raylene Miyamoto, MD DATE OF BIRTH:  Mar 16, 1946  DATE OF ADMISSION:  06/26/2016 DATE OF DISCHARGE:  Jun 30, 2016                              DISCHARGE SUMMARY   DEATH SUMMARY  HISTORY/HOSPITAL COURSE:  This is a 71 year old male with past medical history of atrial fibrillation, unclear if on Xarelto, hypertension, hyperlipidemia, diabetes, COPD, systolic heart failure, ejection fraction noted to be 35% to 40%, coronary artery disease status post CABG, smoker, bad peripheral vascular disease, who presented on December 17, after being found altered with his granddaughter.  The patient was found in the emergency room to have a fever of 104, hemoglobin of 18, leukopenia with left shift with acute renal failure.  Chest x-ray is clear.  The patient had elevated lactic acid level climbing significantly.  The patient was being treated maximally with aggressive antibiotics, concern for septic shock and multiorgan failure.  Patient coded x2 with PEA arrest, total 20 minute downtime with worsening multiorgan dysfunction syndrome, shock liver, acute kidney injury. Basically, the patient grew Strep pyogenes in the bloodstream very quickly.  Unclear of the exact source.  The patient had A line, central line placed.  He was given vancomycin and Zosyn.  In fact suggested he was not survivable by any means with multiorgan failure and the patient was provided comfort care after discussion with multiple family members and expired.  FINAL DIAGNOSES UPON DEATH: 1. Septic shock. 2. Multiorgan dysfunction syndrome secondary to Strep pyogenes     bacteremia. 3. Acute renal failure. 4. Refractory lactic acidosis. 5. Acute respiratory failure. 6. Acute liver dysfunction.     Raylene Miyamoto, MD     DJF/MEDQ  D:  06/27/2016  T:   06/27/2016  Job:  [id number]

## 2016-06-27 NOTE — Discharge Summary (Deleted)
  The note originally documented on this encounter has been moved the the encounter in which it belongs.  

## 2016-06-28 LAB — CULTURE, BLOOD (ROUTINE X 2)

## 2016-07-04 ENCOUNTER — Telehealth: Payer: Self-pay

## 2016-07-04 MED FILL — Medication: Qty: 1 | Status: AC

## 2016-07-04 NOTE — Telephone Encounter (Signed)
On 07/04/2016 I received a death certificate from [place] ([address] The death certificate is for burial. The patient is a patient of Doctor Titus Mould. The death certificate will be taken to [facility] this pm for signature. On 07-25-16 I received the death certificate back from Doctor Titus Mould. I got the death certificate ready and called the funeral home to let them know the death certificate is ready for pickup.

## 2016-07-09 NOTE — Progress Notes (Signed)
PHARMACY - PHYSICIAN COMMUNICATION CRITICAL VALUE ALERT - BLOOD CULTURE IDENTIFICATION (BCID)  Results for orders placed or performed during the hospital encounter of 06/09/2016  Blood Culture ID Panel (Reflexed) (Collected: 06/22/2016  4:45 PM)  Result Value Ref Range   Enterococcus species NOT DETECTED NOT DETECTED   Listeria monocytogenes NOT DETECTED NOT DETECTED   Staphylococcus species NOT DETECTED NOT DETECTED   Staphylococcus aureus NOT DETECTED NOT DETECTED   Streptococcus species DETECTED (A) NOT DETECTED   Streptococcus agalactiae NOT DETECTED NOT DETECTED   Streptococcus pneumoniae NOT DETECTED NOT DETECTED   Streptococcus pyogenes DETECTED (A) NOT DETECTED   Acinetobacter baumannii NOT DETECTED NOT DETECTED   Enterobacteriaceae species NOT DETECTED NOT DETECTED   Enterobacter cloacae complex NOT DETECTED NOT DETECTED   Escherichia coli NOT DETECTED NOT DETECTED   Klebsiella oxytoca NOT DETECTED NOT DETECTED   Klebsiella pneumoniae NOT DETECTED NOT DETECTED   Proteus species NOT DETECTED NOT DETECTED   Serratia marcescens NOT DETECTED NOT DETECTED   Haemophilus influenzae NOT DETECTED NOT DETECTED   Neisseria meningitidis NOT DETECTED NOT DETECTED   Pseudomonas aeruginosa NOT DETECTED NOT DETECTED   Candida albicans NOT DETECTED NOT DETECTED   Candida glabrata NOT DETECTED NOT DETECTED   Candida krusei NOT DETECTED NOT DETECTED   Candida parapsilosis NOT DETECTED NOT DETECTED   Candida tropicalis NOT DETECTED NOT DETECTED    Name of physician (or Provider) Contacted: P Mannam  Changes to prescribed antibiotics required: On vanc, add clinda 2/2 hypotensive and on pressors  Wynona Neat, PharmD, BCPS  2016-07-22  6:36 AM

## 2016-07-09 NOTE — Progress Notes (Signed)
[facility] Progress Note Patient Name: Wayne [name] DOB: [birth year] MRN: [id number]   Date of Service  Jul 14, 2016  HPI/Events of Note  Pt suffered another PEA arrest for 9 mins before ROSC ABG 7.34/38/263. Rest of labs are pending.   eICU Interventions  Continue neo, start epi drip. Prognosis is grim        [name] 07/14/2016, 3:26 AM

## 2016-07-09 NOTE — Progress Notes (Signed)
Lactic acid of 16.2 and troponin of >6.17 reported to Darlina Sicilian NP,

## 2016-07-09 NOTE — Progress Notes (Addendum)
PULMONARY / CRITICAL CARE MEDICINE   Name: Wayne [name] MRN: [id number] DOB: 1945/09/24    ADMISSION DATE:  06/26/2016 CONSULTATION DATE:    REFERRING MD:  EDP  CHIEF COMPLAINT:  Altered mental status  HISTORY OF PRESENT ILLNESS:   Wayne [name] is a 71M with PMH significant for atrial fibrillation (unclear if on Xarelto or not), hypertension, HLD, DMII, COPD, systolic heart failure with EF 35-40%, CAD s/p CABG, tobacco abuse and peripheral vascular disease presented 12/17 after being found altered by his granddaughter.  He was last seen at his usual baseline about 2 weeks ago. He lives with his wife who is in poor health; living conditions were apparently quite poor.  He was noted to be in A fib with RVR with rate 180s; cardioversion was attempted with some improvement in rate, but persistent A fib. On arrival to the ED, he reportedly developed respiratory distress and required urgent intubation.  In ER was febrile to 104, Hgb 18, leukopenia with WBC 3.6, left shift with 94% PMNs, Cr 1.8 and BUN 51.  CXR largely clear, as was CT head. No clear source for sepsis - UA was also benign.  Lactate elevated at 7.25 with repeat climbing to 9.45.  SUBJECTIVE/INTERVAL HX:  PEA arrest x 2 overnight - total ~20 mins CPR Maxed on pressors (epi, levo, neo).  Lactate continues to climb, now 16.  Worsening shock liver and AKI.  Unclear family situation -- ??Media planner.   VITAL SIGNS: BP (!) 86/59   Pulse 86   Temp (!) 100.4 F (38 C)   Resp (!) 28   Ht 5\' 8"  (1.727 m)   Wt 77.6 kg (171 lb 1.2 oz)   SpO2 (!) 28%   BMI 26.01 kg/m   HEMODYNAMICS: CVP:  [10 mmHg] 10 mmHg  VENTILATOR SETTINGS: Vent Mode: PRVC FiO2 (%):  [50 %-100 %] 100 % Set Rate:  [20 bmp] 20 bmp Vt Set:  HJ:8600419 mL] 620 mL PEEP:  [5 cmH20-8 cmH20] 8 cmH20 Plateau Pressure:  [21 cmH20-26 cmH20] 26 cmH20  INTAKE / OUTPUT: I/O last 3 completed shifts: In: 7084.8 [I.V.:4384.8; IV Piggyback:2700] Out: 300  [Urine:300]  PHYSICAL EXAMINATION:  General Well developed, thin, intubated, sedated  HEENT No gross abnormalities. OETT and OGT in place. Dry mucous membranes. Poor dentition.   Pulmonary Coarse breath sounds bilaterally on anterior exam, but no overt wheezes, rales or ronchi. Vent-assisted effort, symmetrical expansion.   Cardiovascular Irregularly irregular. Heart tones largely obscured by breath sounds. S1, s2. Radial pulses palpable. L DP palpable. R DP present on doppler.  Abdomen Soft, non-tender, non-distended, positive bowel sounds, no palpable organomegaly or masses. Normoresonant to percussion.  Musculoskeletal R calf swollen, warm, tender with cold R foot. L index finger partial amputation. Otherwise grossly normal.   Lymphatics No cervical, supraclavicular or axillary adenopathy.   Neurologic Exam limited by sedation. Withdraws to noxious stimuli.   Skin/Integuement No rash. R foot pale and cold with trace edema.      LABS:  BMET  Recent Labs Lab 06/21/2016 2357 Jul 20, 2016 0155 07-20-2016 0500  NA 143 138 141  K 3.6 5.6* 5.0  CL 107 105 109  CO2 20* 17* <7*  BUN 50* 51* 53*  CREATININE 1.71* 2.16* 2.80*  GLUCOSE 157* 115* 175*    Electrolytes  Recent Labs Lab 07/07/2016 1645 06/30/2016 2357 07-20-16 0155 2016-07-20 0500  CALCIUM 10.1 9.1 8.1* 7.4*  MG 2.5*  --  2.4  --   PHOS 1.5*  --  1.6*  --     CBC  Recent Labs Lab 06/20/2016 1645 06/22/2016 2147 07-24-2016 0155 07-24-16 0500  WBC 3.9*  --  6.9 9.1  HGB 18.5*  --  17.0 14.6  HCT 53.7*  --  48.7 45.5  PLT 76* 48* 90* 26*    Coag's  Recent Labs Lab 06/17/2016 2147  APTT 43*  INR 1.63  1.53    Sepsis Markers  Recent Labs Lab 06/15/2016 2143 07-24-16 0003 2016/07/24 0500  LATICACIDVEN 9.45* 9.92* 16.2*    ABG  Recent Labs Lab 06/13/2016 1719 07/01/2016 2019 2016-07-24 0332  PHART 7.439 7.384 7.183*  PCO2ART 25.2* 30.2* 36.4  PO2ART 54.0* 93.0 237.0*    Liver Enzymes  Recent Labs Lab  07/02/2016 2357 07-24-2016 0155 July 24, 2016 0500  AST 112* 144* 1,470*  ALT 26 34 508*  ALKPHOS 74 45 77  BILITOT 3.6* 4.2* 3.4*  ALBUMIN 2.4* 2.0* 1.6*    Cardiac Enzymes  Recent Labs Lab 07/24/16 0500 2016/07/24 0956  TROPONINI 3.11* 6.17*    Glucose  Recent Labs Lab 06/21/2016 1815 07/24/2016 0027 07/24/16 0234 24-Jul-2016 0523 July 24, 2016 0814 07/24/16 0816  GLUCAP 249* 115* 123* 156* 98 160*    Imaging Ct Head Wo Contrast  Result Date: 06/23/2016 CLINICAL DATA:  Acute mental status. Patient unresponsive upon arrival. Enlarged right pupil. EXAM: CT HEAD WITHOUT CONTRAST TECHNIQUE: Contiguous axial images were obtained from the base of the skull through the vertex without intravenous contrast. COMPARISON:  April 22, 2016 FINDINGS: Brain: No subdural, epidural, or subarachnoid hemorrhage. No mass, mass effect, or midline shift. Cerebellum, brainstem, and basal cisterns are normal. Ventricles and sulci are unchanged. Scattered white matter changes are identified. No acute cortical ischemia or infarct is noted. Vascular: Calcified atherosclerosis is seen in the intracranial carotid arteries. Skull: Normal. Negative for fracture or focal lesion. Sinuses/Orbits: There is mucosal thickening in the sphenoid right maxillary sinuses with no air-fluid levels. Mastoid air cells and middle ears are well aerated. Other: None. IMPRESSION: 1. No acute intracranial abnormality. Electronically Signed   By: Dorise Bullion III M.D   On: 07/02/2016 19:11   Dg Chest Port 1 View  Result Date: 07-24-16 CLINICAL DATA:  71 year old male with central line placement. EXAM: PORTABLE CHEST 1 VIEW COMPARISON:  Chest radiograph dated 06/10/2016 FINDINGS: Endotracheal tube in stable positioning above the carina. The enteric tube has been pulled back with side port in the esophagus and tip now in the distal esophagus. Recommend advancing and re-evaluation. Interval placement of a left IJ central line with tip over  central SVC. No pneumothorax. The lungs are clear. There is no pleural effusion mild cardiomegaly. Median sternotomy wires and CABG vascular clips noted. There is osteopenia with degenerative changes of the spine and shoulders. No acute fracture. IMPRESSION: Interval placement of a left IJ central line with tip over central SVC. No pneumothorax. Interval retraction of the enteric tube with tip in the distal esophagus. Recommend advancement and evaluation. Endotracheal tube in stable positioning above the carina. Electronically Signed   By: Anner Crete M.D.   On: 2016/07/24 06:02   Dg Chest Portable 1 View  Result Date: 07/03/2016 CLINICAL DATA:  Code sepsis.  Emergent intubation. EXAM: PORTABLE CHEST 1 VIEW COMPARISON:  06/24/2016 and 04/22/2016 radiographs. FINDINGS: 1958 hours. Two views obtained. Patient has been intubated. Endotracheal tube tip is in the mid trachea. A nasogastric tube has been placed, projecting below the diaphragm. There is stable cardiac enlargement post CABG. The lungs remain clear. No pneumothorax  or significant pleural effusion. IMPRESSION: Satisfactory endotracheal and nasogastric tube placement. Stable cardiomegaly. Electronically Signed   By: Richardean Sale M.D.   On: 06/18/2016 20:29   Dg Chest Portable 1 View  Result Date: 07/03/2016 CLINICAL DATA:  Altered mental status. EXAM: PORTABLE CHEST 1 VIEW COMPARISON:  Chest and rib radiographs 04/22/2016. Chest CT 11/21/2015. FINDINGS: 1700 hour. Mild patient rotation to the right. The heart size and mediastinal contours are stable status post median sternotomy and CABG. The heart is mildly enlarged. The lungs are clear. There is no pleural effusion or pneumothorax. Glenohumeral degenerative changes are present bilaterally. IMPRESSION: Stable mild cardiac enlargement post CABG. No acute cardiopulmonary process. Electronically Signed   By: Richardean Sale M.D.   On: 06/22/2016 17:22   STUDIES:  BLE venous doppler 12/17>>>  acute RLE DVT (posterior tibial) 2D echo 12/18>>>  CULTURES: Blood cx x 2  12/17 >>strep pyogenes>>>  ANTIBIOTICS: Vancomycin 12/17 >> Zosyn 12/17 >>  SIGNIFICANT EVENTS: Coded x 2  LINES/TUBES: PIV OETT 12/17 >> L IJ CVL 12/17>>> L rad aline 12/17>>>  DISCUSSION: 56M presenting with high fever, a fib with RVR, acute hypoxemic respiratory failure, lactic acidosis, acute kidney injury and R lower extremity abnormalities.  Subsequent PEA arrest x 2 with total ~20 mins CPR.   Strep pyogenes bacteremia. POS RLE DVT.   ASSESSMENT / PLAN:  PULMONARY A: Acute hypoxemic respiratory failure Underlying COPD ?PE - has RLE DVT P:   Vent support - 8cc/kg  F/u CXR  F/u ABG  Wean FiO2 as able Continue heparin gtt Empiric antibiotics as above  PRN BD Heparin gtt as below   CARDIOVASCULAR A:  A fib with RVR Troponin leak Concern for RLE vascular compromise Systolic dysfunction EF 123456 PVD CAD s/p CABG P:  Rate control goal < 120 Trend troponin - continues to climb  Echo pending  Evaluate RLE with dopplers (venous & arterial) Empiric heparin gtt - monitor closely with thrombocytopenia - suspect DIC  Trend CPK Pressors as needed to keep MAP >65 - maxed on epi, levo, neo   RENAL A:   Acute kidney injury  Lactic acidosis P:   Continue gentle volume  Avoid nephrotoxins Renally dose medications F/u chem  I/Os Maintain Foley Trend lactate  GASTROINTESTINAL A:   No acute issues P:   PPI for gi ppx  HEMATOLOGIC A:   Leukopenia, thrombocytopenia with relative erythrocytosis RLE DVT  Concern for PE  Concern for DIC  P:  Fluid resuscitate Heparin gtt Trend CBC  INFECTIOUS A:   Strep pyogenes bacteremia  P:   abx as above  Pharmacy monitoring  Follow cultures   ENDOCRINE A:   DMII / hyperglycemia P:   CBG / SSI  NEUROLOGIC A:   Altered mental status / acute toxic-metabolic encephalopathy - etiology unclear P:   RASS goal: 0 to -1 No LP 2/2  unclear anticoagulation status Intermittent sedation with fentanyl / versed; wean as tolerated  FAMILY  - Updates: Grim prognosis.  On max support.  Would not perform further CPR.  Will d/w Dr. Titus Mould.  Has one daughter at bedside (updated briefly) who is a biological child but who was adopted by her grandmother when she was young and did not know pt until last few years.  She thinks that Wayne [name] who is the pt's wife's daughter is likely the decision maker but she thinks that they all live together when in fact [place] lives in [place].  Pt was apparently caring for his bed ridden,  demented wife.  Will ask Social work to investigate to help establish Media planner and ensure that wife is safe (very poor living conditions found per EMS).   - Inter-disciplinary family meet or Palliative Care meeting due by:  day Albers, NP 2016/07/20  10:55 AM Pager: [phone] or [phone]  STAFF NOTE: I, Merrie Roof, MD FACP have personally reviewed patient's available data, including medical history, events of note, physical examination and test results as part of my evaluation. I have discussed with resident/NP and other care providers such as pharmacist, RN and RRT. In addition, I personally evaluated patient and elicited key findings of: unresponsove, found down, MODS, ronchi, abdomen, soft, no erythema ext, mottling, strep pyogenese, no clear source necrosis, MODS, this is NOT survivable, lactic rising, abg reviewed, increase rate,. All AG acidosis, bicarb will NOT change outcome and may rise co2 and harm - dc, O2 needs low, O2 to 50% as pao2 237, shock liver, sepsis thomrbocytopenia, assess fibrinogen likely some dic, dvt noted, presumed PE, doubt causing shock state, , would dc heparin with plat count as risk bleed high, will filter if survises night, no role neo, maxed on epi, levophed, vaso will NOT change outcome, ensure stress roids, IVIG also would not change outcome, wil update  family, aggressive measures wil l not change outcome and would be futile and medically ineffective thrapy The patient is critically ill with multiple organ systems failure and requires high complexity decision making for assessment and support, frequent evaluation and titration of therapies, application of advanced monitoring technologies and extensive interpretation of multiple databases.   Critical Care Time devoted to patient care services described in this note is 45 Minutes. This time reflects time of care of this signee: Merrie Roof, MD FACP. This critical care time does not reflect procedure time, or teaching time or supervisory time of PA/NP/Med student/Med Resident etc but could involve care discussion time. Rest per NP/medical resident whose note is outlined above and that I agree with   Wayne [name]. Titus Mould, MD, FACP Pgr: [facility] 07/20/16 12:02 PM   I have had extensive discussions with family biological daughter ( I am also gonna call Wayne [name], other daughter in law - done and she agrees). We discussed patients current circumstances and organ failures. We also discussed patient's prior wishes under circumstances such as this. Family has decided to NOT perform resuscitation if arrest but to continue current medical support for now. regardeless of the social dynamics, this is futile and we should not provide heroics. Everyone now agree to comfort care.  Wayne [name]. Titus Mould, MD, [place] Pgr: [facility]

## 2016-07-09 NOTE — Progress Notes (Signed)
CSW engaged with Patient's family at bedside to determine next of kin/decision maker. Patient's biological grandson reports that Freda Munro, Patient's wife's daughter, is the Healthcare POA. Freda Munro lives in [place]. CSW informed family that [place] will need to fax HCPOA paperwork. Family agreeable. CSW available for support.          Lorrine Kin, MSW, LCSW [facility] ED/89M Clinical Social Worker [phone]

## 2016-07-09 NOTE — Code Documentation (Signed)
  Patient Name: Wayne [name]   MRN: [id number]   Date of Birth/ Sex: March 26, 1946 , male      Admission Date: 07/07/2016  Attending Provider: Rush Farmer, MD  Primary Diagnosis: <principal problem not specified>   Indication: Pt was in his usual state of health until this AM, when he was noted to be unresponsive in the ED, he was intubated there and admitted to the ICU where he became pulseless. Code blue was subsequently called. At the time of arrival on scene, ACLS protocol was underway.   Technical Description:  - CPR performance duration:  10 minutes  - Was defibrillation or cardioversion used? No   - Was external pacer placed? No  - Was patient intubated pre/post CPR? Yes   Medications Administered: Y = Yes; Blank = No Amiodarone    Atropine    Calcium    Epinephrine  Y  Lidocaine    Magnesium    Norepinephrine    Phenylephrine    Sodium bicarbonate  Y  Vasopressin     Post CPR evaluation:  - Final Status - Was patient successfully resuscitated ? Yes - What is current rhythm? Sinus tachycardia  - What is current hemodynamic status? Tachycardic   Miscellaneous Information:  - Labs sent, including: ABG, EKG   - Primary team notified?  Yes  - Family Notified? Yes  - Additional notes/ transfer status: Patient will stay in ICU      Ledell Noss, MD  07-22-16, 2:59 AM

## 2016-07-09 NOTE — Progress Notes (Signed)
[facility] Progress Note Patient Name: Wayne [name] DOB: [birth year] MRN: [id number]   Date of Service  Jul 03, 2016  HPI/Events of Note  New admit from Ed with sepsis of unclear source, elevated LA (increasing from 7.25 to 9.92) , afib with RVR (HR 130-140), hyperthemia (T 105)  eICU Interventions  Continue broad spectrum abx, tylenol 1 lt IVF bolus followed by NS infusion at 125cc/hr May need amio if hr does not improve     Intervention Category Evaluation Type: Other  Bandy Honaker 2016-07-03, 1:40 AM

## 2016-07-09 NOTE — Progress Notes (Signed)
Wasted 270mL morphine in sink with Damita Dunnings RN

## 2016-07-09 NOTE — Procedures (Signed)
Central Venous Catheter Insertion Procedure Note NASHID MENZE [id number] 1946/05/14  Procedure: Insertion of Central Venous Catheter Indications: Assessment of intravascular volume, Drug and/or fluid administration and Frequent blood sampling  Procedure Details Consent: Unable to obtain consent because of emergent medical necessity. Time Out: Verified patient identification, verified procedure, site/side was marked, verified correct patient position, special equipment/implants available, medications/allergies/relevent history reviewed, required imaging and test results available.  Performed  Maximum sterile technique was used including antiseptics, cap, gloves, gown, hand hygiene, mask and sheet. Skin prep: Chlorhexidine; local anesthetic administered A antimicrobial bonded/coated triple lumen catheter was placed in the left internal jugular vein using the Seldinger technique under ultrasound guidance.  After local anesthesia, the finder needle was advanced into the vessel under ultrasound guidance. Dark red non-pulsatile blood was aspirated. The wire was threaded into the vessel and needle removed. The wire was confirmed in the vein with ultrasound. A nick was made in the skin and the tract was dilated. The catheter was advanced over the wire into the vessel. The wire was removed. All ports aspirated and flushed easily. The catheter was sutured in place and biopatch and dressing were applied. No complications. Post procedure CXR as below. EBL <5cc.   Evaluation Blood flow good Complications: No apparent complications Patient did tolerate procedure well. Chest X-ray ordered to verify placement.  CXR: LIJ approach CVC is seen crossing the midline with the tip overlying the SVC.   Yisroel Ramming, MD Pulmonary and Critical Care Jul 17, 2016 4:28 AM

## 2016-07-09 NOTE — Progress Notes (Signed)
[facility] Progress Note Patient Name: Wayne [name] DOB: [birth year] MRN: [id number]   Date of Service  07-01-16  HPI/Events of Note  Strep pyogenes in Bcx  eICU Interventions  Will add clindamycin per pharmacy reccs     Intervention Category Evaluation Type: Other  Wayne [name] 07-01-2016, 6:32 AM

## 2016-07-09 NOTE — Progress Notes (Signed)
[facility] Progress Note Patient Name: PARMINDER FORTON DOB: 1946/03/17 MRN: [id number]   Date of Service  2016-07-04  HPI/Events of Note  Continued hypotension. Trop elevated to 1.93  eICU Interventions  Will start peripheral neo. Will likely need CVL when MD gets there from [facility].  1 lt IVF Stat Echo, continue heparin gtt.        Berna Gitto 2016-07-04, 2:31 AM

## 2016-07-09 NOTE — Progress Notes (Signed)
RT NOTE:  ABG post code ran on ISTAT, device will not communicate results into [facility].   PH: 7.183 PCO2: 36.4 PO2: 237 BE: -13 HCO3: 13.2 TCO2: 14 SO2: 100% TEMP: 39.8 C  MD @ bedside aware

## 2016-07-09 NOTE — Progress Notes (Signed)
RT terminally extubated patient to room air. Family at bedside. No complications. RT will continue to monitor.

## 2016-07-09 NOTE — Progress Notes (Signed)
PHARMACY NOTE:  ANTIMICROBIAL RENAL DOSAGE ADJUSTMENT  Current antimicrobial regimen includes a mismatch between antimicrobial dosage and estimated renal function.  As per policy approved by the Pharmacy & Therapeutics and Medical Executive Committees, the antimicrobial dosage will be adjusted accordingly.  Current antimicrobial dosage:  Clindamycin 600mg  IV Q8H  Indication: Group A strep bacteremia    Antimicrobial dosage has been changed to:  Clindamycin 900mg  IV Q8H  Additional comments: Increased dose of clindamycin to 900mg  due to indication of Group A strep bacteremia.  Thank you for allowing pharmacy to be a part of this patient's care.  Reginia Naas, [facility] 2016-07-24 8:52 AM

## 2016-07-09 NOTE — Procedures (Signed)
Arterial Catheter Insertion Procedure Note Wayne [name] [id number] 02-Jul-1946  Procedure: Insertion of Arterial Catheter  Indications: Blood pressure monitoring and Frequent blood sampling  Procedure Details Consent: Unable to obtain consent because of emergent medical necessity. Time Out: Verified patient identification, verified procedure, site/side was marked, verified correct patient position, special equipment/implants available, medications/allergies/relevent history reviewed, required imaging and test results available.  Performed  Maximum sterile technique was used including antiseptics, cap, gloves, gown, hand hygiene, mask and sheet. Skin prep: Chlorhexidine; local anesthetic administered 20 gauge catheter was inserted into left radial artery using the Seldinger technique under ultrasound guidance.  Evaluation Blood flow good; BP tracing good. Complications: No apparent complications.  Yisroel Ramming, MD Pulmonary and Critical Care July 12, 2016 4:23 AM

## 2016-07-09 NOTE — Progress Notes (Signed)
Pharmacy Antibiotic Note  Wayne [name] is a 71 y.o. male admitted on 06/09/2016 with AMS > code sepsis. Tmax is 105.1 and WBC is WNL. SCr is worsening and now up to 2.8. Lactic acid is significantly elevated.   Plan: Change vancomycin to 750mg  IV Q24H F/u renal fxn, C&S, clinical status and trough at SS  Height: 5\' 8"  (172.7 cm) Weight: 171 lb 1.2 oz (77.6 kg) IBW/kg (Calculated) : 68.4  Temp (24hrs), Avg:103.4 F (39.7 C), Min:100.4 F (38 C), Max:105.1 F (40.6 C)   Recent Labs Lab 06/12/2016 1645 07/01/2016 1702 07/06/2016 2143 07/05/2016 2357 06/26/2016 0003 06-26-16 0155 06-26-2016 0500  WBC 3.9*  --   --   --   --  6.9 9.1  CREATININE 1.72*  --   --  1.71*  --  2.16* 2.80*  LATICACIDVEN  --  7.25* 9.45*  --  9.92*  --  16.2*    Estimated Creatinine Clearance: 23.8 mL/min (by C-G formula based on SCr of 2.8 mg/dL (H)).    Allergies  Allergen Reactions  . Codeine Itching and Rash    Antimicrobials this admission: 12/17 zosyn x1 12/17 vancomycin > 12/18 Clinda>>  Dose adjustments this admission: N/A  Microbiology results: 12/17 blood cx - GPC in chains 2/2 12/17 BCID - strep pyogenes 12/18 MRSA - NEG  Thank you for allowing pharmacy to be a part of this patient's care.  Nefertiti Mohamad, Rande Lawman Jun 26, 2016 8:37 AM

## 2016-07-09 NOTE — Progress Notes (Signed)
RT NOTE:  Respiratory attempted ALINE x3 without success. MD aware.

## 2016-07-09 NOTE — Progress Notes (Signed)
  Echocardiogram 2D Echocardiogram has been performed.  Wayne [name] July 03, 2016, 9:06 AM

## 2016-07-09 NOTE — Progress Notes (Signed)
   July 17, 2016 1415  Clinical Encounter Type  Visited With Patient and family together  Visit Type Other (Comment) ([place] consult)  Spiritual Encounters  Spiritual Needs Emotional  Stress Factors  Patient Stress Factors Not reviewed  Family Stress Factors Family relationships  Introduction to family. Pt unresponsive. No needs at this time.

## 2016-07-09 NOTE — Progress Notes (Signed)
VASCULAR LAB PRELIMINARY  PRELIMINARY  PRELIMINARY  PRELIMINARY  Bilateral lower extremity venous duplex completed.    Preliminary report:  There is acute DVT noted in the right posterior tibial vein.  All other veins appear thrombus free.  Incidentally, there appears to be chronic arterial disease, but nothing to suggest any acute arterial event.  Gave report to Ander Purpura, RN  Mauro Kaufmann, [place], RVT 01-Jul-2016, 10:05 AM

## 2016-07-09 NOTE — Telephone Encounter (Signed)
Pt is now deceased  Will forward to MR to notify him

## 2016-07-09 NOTE — Progress Notes (Addendum)
[facility] Progress Note Patient Name: Wayne [name] DOB: 1946-06-19 MRN: [id number]   Date of Service  29-Jun-2016  HPI/Events of Note  PEA arrest (see code note) Pt had ROSC after 10 mins, epi, bicarb  eICU Interventions  Repeat labs Amiodarone for persistent a fib Continue peripheral neo Place A line and CVL  I called and spoke with his wife and POA, Giselle Capell over the phone. She has dementia and I am not sure she understands the clinical situation. She wants everything done. I have asked her to come in to hospital but she is on home O2 and is not mobile. The nearest family member is in [place]. Prognosis is very poor.         Emmilyn Crooke 06-29-2016, 3:08 AM

## 2016-07-09 NOTE — Progress Notes (Signed)
ANTICOAGULATION CONSULT NOTE - Initial Consult  Pharmacy Consult for heparin Indication: Afib w/ troponin leak, possible RLE ischemia vs DVT  Allergies  Allergen Reactions  . Codeine Itching and Rash    Patient Measurements: Weight: 170 lb (77.1 kg)  Vital Signs: Temp: 104.9 F (40.5 C) (12/17 2338) Temp Source: Core (Comment) (12/17 2338) BP: 98/72 (12/17 2336) Pulse Rate: 34 (12/17 2336)  Labs:  Recent Labs  07/08/2016 1645 07/01/2016 2147  HGB 18.5*  --   HCT 53.7*  --   PLT 76* 48*  APTT  --  43*  LABPROT  --  19.5*  18.5*  INR  --  1.63  1.53  CREATININE 1.72*  --     Estimated Creatinine Clearance: 38.7 mL/min (by C-G formula based on SCr of 1.72 mg/dL (H)).   Medical History: Past Medical History:  Diagnosis Date  . Actinic keratosis   . Atrial fibrillation ([place])   . CAD (coronary artery disease)   . Carpal tunnel syndrome   . Chronic ischemic heart disease   . COPD (chronic obstructive pulmonary disease) ([place])   . Degeneration of cervical intervertebral disc   . Depression   . Diabetes mellitus    type 2  . Hyperlipidemia   . Hypertension   . Myocardial infarction   . Osteoarthrosis, unspecified whether generalized or localized, shoulder region   . Other disorder of muscle, ligament, and fascia   . Peripheral vascular disease ([place])   . Shortness of breath     Assessment: 71yo male presents w/ AMS, treated for sepsis, known h/o Afib on Xarelto but pt's daughter states pt is likely noncompliant, now also w/ troponin leak w/ concern for possible RLE ischemia vs DVT, to begin heparin.  Goal of Therapy:  Heparin level 0.3-0.7 units/ml aPTT 66-102 seconds Monitor platelets by anticoagulation protocol: Yes   Plan:  Will begin heparin gtt at 1100 units/hr and monitor heparin levels (and aPTT in case pt has been taking Xarelto) and CBC.  Wynona Neat, PharmD, BCPS  06/27/2016,12:05 AM

## 2016-07-09 DEATH — deceased

## 2016-10-12 IMAGING — CT CT CHEST SUPER D W/O CM
1 of 2 series · 4 of 34 positions shown, 5 images · non-contrast
Comparison: Multiple exams, including all LN[REDACTED] and 03/08/2014

CLINICAL DATA: Right lung nodule. COPD. Tobacco use. Super D
protocol. Increased shortness of breath.

EXAM:
CT CHEST WITHOUT CONTRAST
TECHNIQUE: Multidetector CT imaging of the chest was performed using thin slice
collimation for electromagnetic bronchoscopy planning purposes,
without intravenous contrast.

[Series 602: cor · coronal · 0.77mm/px · 4 of 123 slices shown, 5 images]
[im 25/123  mediastinal]
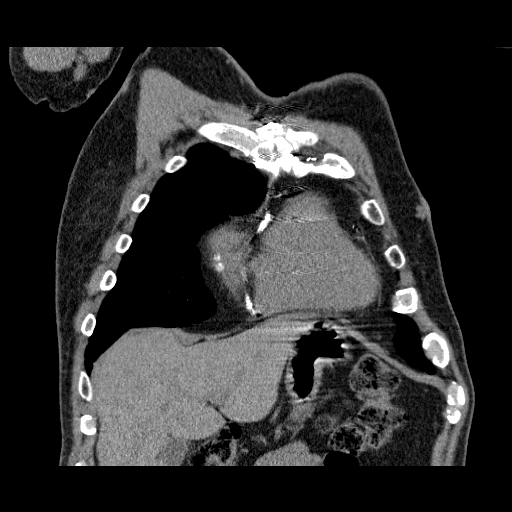
[im 25/123  lung]
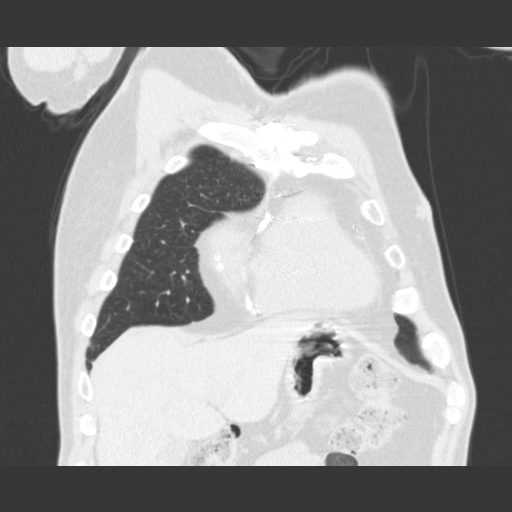
[im 49/123  lung]
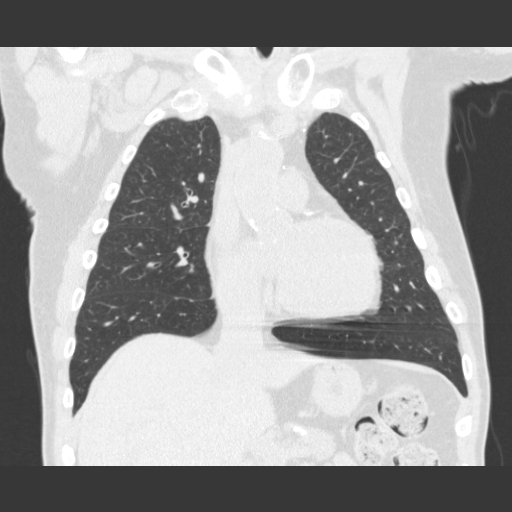
[im 74/123  lung]
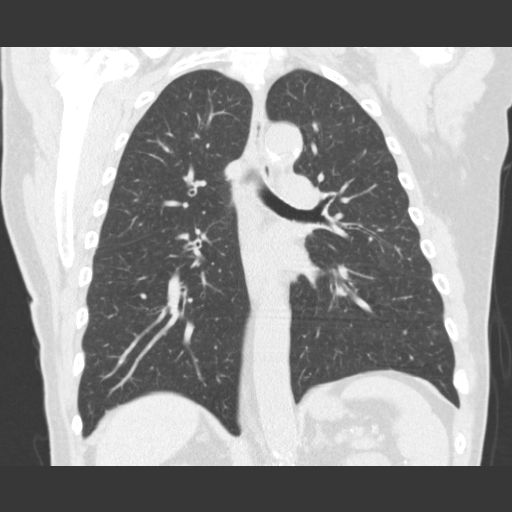
[im 98/123  lung]
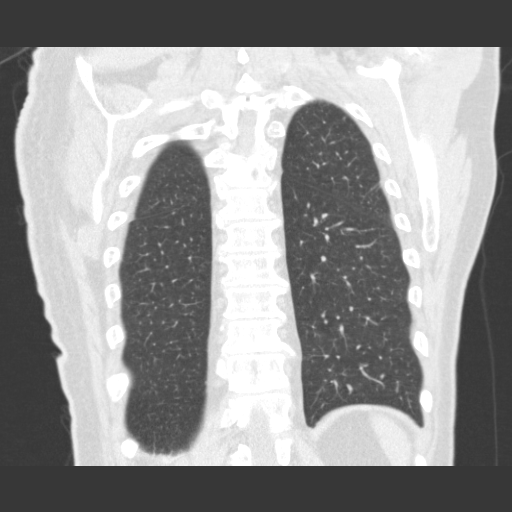

[4 of 34 positions shown; findings below may reference images not displayed]

FINDINGS: Mediastinum/Nodes: A right upper paratracheal lymph node measures 9
mm in short axis, at the upper limits of normal range. Dense
coronary atherosclerosis. Prior CABG. Mild cardiomegaly.
Subcutaneous cystic lesion or subcutaneous lymph node along the
right axilla, 7 mm in short axis on image 15 series 2.

Lungs/Pleura: Emphysema. Bilateral airway thickening. Right lower
lobe pulmonary nodule 0.8 by 0.9 cm, new formerly 0.8 cm by 0.8 cm
by my measurements on 04/27/2013.

Upper abdomen: Cholelithiasis.

Musculoskeletal: Severe degenerative glenohumeral arthropathy
bilaterally. There multiple old bilateral rib fractures and an
interval subacute healing left lateral ninth rib fracture.
IMPRESSION: 1. By my measurements the right lower lobe nodule currently measures
0.9 by 0.8 cm and was formerly 0.8 by 0.8 cm on 04/27/2013. In
addition to the lack of hypermetabolic activity on prior PET-CT, the
lack of significant interval growth over the past 18 months is
reassuring. Given the size of the lesion, I would recommend a 6-12
month follow up chest CT (which could be done without contrast) to
further follow this lesion.
2. Emphysema with bilateral airway thickening.
3. Atherosclerosis.
4. Healing interval subacute left lateral ninth rib fracture. Old
bilateral healed rib fractures are also noted.
5. Severe degenerative glenohumeral arthropathy.
